# Patient Record
Sex: Male | Born: 1955 | Race: Black or African American | Hispanic: No | Marital: Married | Smoking: Current some day smoker
Health system: Southern US, Community
[De-identification: ages and names within clinical notes are randomized; demographics above are authoritative.]

## PROBLEM LIST (undated history)

## (undated) DIAGNOSIS — R6882 Decreased libido: Secondary | ICD-10-CM

## (undated) DIAGNOSIS — I10 Essential (primary) hypertension: Secondary | ICD-10-CM

## (undated) HISTORY — PX: LUMBAR LAMINECTOMY: SHX95

## (undated) HISTORY — DX: Essential (primary) hypertension: I10

## (undated) HISTORY — DX: Decreased libido: R68.82

---

## 2006-06-14 DIAGNOSIS — N410 Acute prostatitis: Secondary | ICD-10-CM | POA: Insufficient documentation

## 2006-10-10 DIAGNOSIS — L259 Unspecified contact dermatitis, unspecified cause: Secondary | ICD-10-CM | POA: Insufficient documentation

## 2007-02-01 DIAGNOSIS — R6882 Decreased libido: Secondary | ICD-10-CM

## 2007-02-01 HISTORY — DX: Decreased libido: R68.82

## 2008-01-18 DIAGNOSIS — R5383 Other fatigue: Secondary | ICD-10-CM | POA: Insufficient documentation

## 2009-01-07 DIAGNOSIS — I10 Essential (primary) hypertension: Secondary | ICD-10-CM | POA: Insufficient documentation

## 2009-01-07 DIAGNOSIS — R634 Abnormal weight loss: Secondary | ICD-10-CM | POA: Insufficient documentation

## 2009-01-07 DIAGNOSIS — J019 Acute sinusitis, unspecified: Secondary | ICD-10-CM | POA: Insufficient documentation

## 2009-07-28 DIAGNOSIS — L679 Hair color and hair shaft abnormality, unspecified: Secondary | ICD-10-CM | POA: Insufficient documentation

## 2009-10-28 DIAGNOSIS — D179 Benign lipomatous neoplasm, unspecified: Secondary | ICD-10-CM | POA: Insufficient documentation

## 2009-10-28 DIAGNOSIS — L02419 Cutaneous abscess of limb, unspecified: Secondary | ICD-10-CM | POA: Insufficient documentation

## 2013-03-15 ENCOUNTER — Emergency Department: Payer: Self-pay | Admitting: Emergency Medicine

## 2014-06-19 DIAGNOSIS — R1013 Epigastric pain: Secondary | ICD-10-CM | POA: Insufficient documentation

## 2014-06-19 DIAGNOSIS — Z125 Encounter for screening for malignant neoplasm of prostate: Secondary | ICD-10-CM | POA: Insufficient documentation

## 2014-06-19 DIAGNOSIS — N419 Inflammatory disease of prostate, unspecified: Secondary | ICD-10-CM | POA: Insufficient documentation

## 2014-08-11 ENCOUNTER — Ambulatory Visit (INDEPENDENT_AMBULATORY_CARE_PROVIDER_SITE_OTHER): Payer: 59 | Admitting: Family Medicine

## 2014-08-11 ENCOUNTER — Encounter: Payer: Self-pay | Admitting: Family Medicine

## 2014-08-11 VITALS — BP 142/86 | HR 90 | Temp 98.8°F | Resp 18 | Ht 77.0 in | Wt 215.4 lb

## 2014-08-11 DIAGNOSIS — B359 Dermatophytosis, unspecified: Secondary | ICD-10-CM | POA: Insufficient documentation

## 2014-08-11 DIAGNOSIS — I10 Essential (primary) hypertension: Secondary | ICD-10-CM

## 2014-08-11 MED ORDER — CLOTRIMAZOLE-BETAMETHASONE 1-0.05 % EX CREA: 1.0000 "application " | TOPICAL_CREAM | Freq: Two times a day (BID) | CUTANEOUS | Status: DC

## 2014-08-11 NOTE — Patient Instructions (Signed)
F/U 6 MO 

## 2014-08-11 NOTE — Addendum Note (Signed)
Addended by: Ashok Norris on: 08/11/2014 03:04 PM   Modules accepted: Miquel Dunn

## 2014-08-11 NOTE — Progress Notes (Signed)
Name: Kristopher Davis   MRN: [id number]    DOB: Jul 28, 1955   Date:08/11/2014       Progress Note  Subjective  Chief Complaint  Chief Complaint  Patient presents with  . Hypertension  . Rash    under left arm    Hypertension This is a chronic problem. The current episode started more than 1 year ago. The problem is unchanged. The problem is uncontrolled. Associated symptoms include anxiety. Pertinent negatives include no blurred vision, chest pain, headaches, neck pain, orthopnea, palpitations or shortness of breath. There are no associated agents to hypertension. Risk factors for coronary artery disease include smoking/tobacco exposure. Past treatments include diuretics. There are no compliance problems.   Rash Pertinent negatives include no congestion, cough, diarrhea, fever, joint pain, shortness of breath, sore throat or vomiting.     Past Medical History  Diagnosis Date  . Hypertension   . Decreased libido 02/01/2007    History  Substance Use Topics  . Smoking status: Current Every Day Smoker -- 1.00 packs/day    Types: Cigarettes  . Smokeless tobacco: Not on file  . Alcohol Use: 0.0 oz/week    0 Standard drinks or equivalent per week     Current outpatient prescriptions:  .  chlorthalidone (HYGROTON) 25 MG tablet, Take 1 tablet by mouth daily., Disp: , Rfl:   No Known Allergies  Review of Systems  Constitutional: Negative for fever, chills and weight loss.  HENT: Negative for congestion, hearing loss, sore throat and tinnitus.   Eyes: Negative for blurred vision, double vision and redness.  Respiratory: Negative for cough, hemoptysis and shortness of breath.   Cardiovascular: Negative for chest pain, palpitations, orthopnea, claudication and leg swelling.  Gastrointestinal: Negative for heartburn, nausea, vomiting, diarrhea, constipation and blood in stool.  Genitourinary: Negative for dysuria, urgency, frequency and hematuria.  Musculoskeletal: Negative for myalgias,  back pain, joint pain, falls and neck pain.  Skin: Positive for itching and rash (left axilla).  Neurological: Negative for dizziness, tingling, tremors, focal weakness, seizures, loss of consciousness, weakness and headaches.  Endo/Heme/Allergies: Does not bruise/bleed easily.  Psychiatric/Behavioral: Negative for depression and substance abuse. The patient is not nervous/anxious and does not have insomnia.      Objective  Filed Vitals:   08/11/14 1434  BP: 142/86  Pulse: 90  Temp: 98.8 F (37.1 C)  TempSrc: Oral  Resp: 18  Height: 6\' 5"  (1.956 m)  Weight: 215 lb 6.4 oz (97.705 kg)  SpO2: 98%     Physical Exam  Constitutional: He is oriented to person, place, and time and well-developed, well-nourished, and in no distress.  HENT:  Head: Normocephalic.  Eyes: EOM are normal. Pupils are equal, round, and reactive to light.  Neck: Normal range of motion. Neck supple. No thyromegaly present.  Cardiovascular: Normal rate, regular rhythm and normal heart sounds.   No murmur heard. Pulmonary/Chest: Effort normal and breath sounds normal. No respiratory distress. He has no wheezes.  Abdominal: Soft. Bowel sounds are normal.  Musculoskeletal: Normal range of motion. He exhibits no edema.  Lymphadenopathy:    He has no cervical adenopathy.  Neurological: He is alert and oriented to person, place, and time. No cranial nerve deficit. Gait normal. Coordination normal.  Skin: Skin is warm and dry. No rash noted.     Psychiatric: Affect and judgment normal.         Assessment & Plan 1. Benign essential HTN stable - COMPLETE METABOLIC PANEL WITH GFR - Lipid Profile  2. Tinea  LOTRISONE CREAM

## 2014-09-18 ENCOUNTER — Other Ambulatory Visit: Payer: Self-pay | Admitting: Family Medicine

## 2014-11-30 ENCOUNTER — Other Ambulatory Visit: Payer: Self-pay | Admitting: Family Medicine

## 2015-01-09 ENCOUNTER — Other Ambulatory Visit: Payer: Self-pay

## 2015-01-09 ENCOUNTER — Telehealth: Payer: Self-pay | Admitting: Family Medicine

## 2015-01-09 ENCOUNTER — Other Ambulatory Visit: Payer: Self-pay | Admitting: Family Medicine

## 2015-01-09 NOTE — Telephone Encounter (Signed)
Sent to Dr. Sowles  

## 2015-01-09 NOTE — Telephone Encounter (Signed)
PT NEEDS REFILL ON MEDICATION SAYS HE IS ONLY ON ONE. [place] [address] [facility]

## 2015-01-11 MED ORDER — CHLORTHALIDONE 25 MG PO TABS: 25.0000 mg | ORAL_TABLET | Freq: Every day | ORAL | Status: DC

## 2015-01-16 NOTE — Telephone Encounter (Signed)
ERRENEROUS

## 2015-02-13 ENCOUNTER — Other Ambulatory Visit: Payer: Self-pay | Admitting: Family Medicine

## 2015-05-05 ENCOUNTER — Other Ambulatory Visit: Payer: Self-pay | Admitting: Family Medicine

## 2015-06-11 ENCOUNTER — Other Ambulatory Visit: Payer: Self-pay | Admitting: Family Medicine

## 2015-07-09 ENCOUNTER — Ambulatory Visit (INDEPENDENT_AMBULATORY_CARE_PROVIDER_SITE_OTHER): Payer: 59 | Admitting: Family Medicine

## 2015-07-09 ENCOUNTER — Encounter: Payer: Self-pay | Admitting: Family Medicine

## 2015-07-09 VITALS — BP 132/84 | HR 91 | Temp 99.1°F | Resp 16 | Wt 215.0 lb

## 2015-07-09 DIAGNOSIS — Z114 Encounter for screening for human immunodeficiency virus [HIV]: Secondary | ICD-10-CM

## 2015-07-09 DIAGNOSIS — Z1211 Encounter for screening for malignant neoplasm of colon: Secondary | ICD-10-CM | POA: Insufficient documentation

## 2015-07-09 DIAGNOSIS — Z5181 Encounter for therapeutic drug level monitoring: Secondary | ICD-10-CM | POA: Diagnosis not present

## 2015-07-09 DIAGNOSIS — Z125 Encounter for screening for malignant neoplasm of prostate: Secondary | ICD-10-CM | POA: Diagnosis not present

## 2015-07-09 DIAGNOSIS — Z1159 Encounter for screening for other viral diseases: Secondary | ICD-10-CM | POA: Insufficient documentation

## 2015-07-09 DIAGNOSIS — I10 Essential (primary) hypertension: Secondary | ICD-10-CM | POA: Diagnosis not present

## 2015-07-09 DIAGNOSIS — Z8719 Personal history of other diseases of the digestive system: Secondary | ICD-10-CM | POA: Diagnosis not present

## 2015-07-09 DIAGNOSIS — R7401 Elevation of levels of liver transaminase levels: Secondary | ICD-10-CM

## 2015-07-09 DIAGNOSIS — Z0000 Encounter for general adult medical examination without abnormal findings: Secondary | ICD-10-CM | POA: Diagnosis not present

## 2015-07-09 DIAGNOSIS — R740 Nonspecific elevation of levels of transaminase and lactic acid dehydrogenase [LDH]: Secondary | ICD-10-CM

## 2015-07-09 MED ORDER — ASPIRIN EC 81 MG PO TBEC: 81.0000 mg | DELAYED_RELEASE_TABLET | Freq: Every day | ORAL | Status: DC

## 2015-07-09 NOTE — Assessment & Plan Note (Signed)
Continue chlorthalidone, try DASH guidelines

## 2015-07-09 NOTE — Assessment & Plan Note (Signed)
Discussed one-time HIV screening recommendation per USPSTF guidelines; patient agrees with testing; HIV antibody ordered; patient had already left when order was printed, so staff carried it next door to [place]

## 2015-07-09 NOTE — Assessment & Plan Note (Signed)
Discussed one-time hep C screening recommendation for individuals born between 1945-1965 per USPSTF guidelines; patient agrees with testing; Hep C Ab ordered; patient had already left when order was printed, so staff carried it next door to [place]

## 2015-07-09 NOTE — Progress Notes (Signed)
BP 132/84 mmHg  Pulse 91  Temp(Src) 99.1 F (37.3 C) (Oral)  Resp 16  Wt 215 lb (97.523 kg)  SpO2 97%   Subjective:    Patient ID: Kristopher Davis, male    DOB: January 16, 1956, 60 y.o.   MRN: [id number]  HPI: Kristopher Davis is a 60 y.o. male  Chief Complaint  Patient presents with  . Hypertension   He was sick last week; left his lunch in the truck and it got warm, then brought it into the house and finished; bologna and ham, bologna had turned color at edge and had mayonnaise; he got indigestion, dull ache, lower 1/3 of midline sternal area, went to bed around 6 pm and was back up by 11:30 pm and felt fine; no nausea; no chest pain; no fever; no one in the house got sick He thinks it was the sandwich; he does not think it was his heart; no family hx of heart problems listed; no high cholesterol personally to his knowledge (no lipid panel in the chart for me to verify or see previous results); labs were ordered in the fall, but there are no results; no family hx of high cholesterol; he does not take a daily aspirin  He has HTN; takes chlorthalidone, taking it for 2 or 2.5 years; no problems on that  No hx of high cholesterol; no one in the family with high cholesterol; bologna sandwich with slices of kosher pickles; regular ginger ale today (just for my knowledge as we get labs today)  Depression screen PHQ 2/9 07/09/2015  Decreased Interest 0  Down, Depressed, Hopeless 0  PHQ - 2 Score 0   Relevant past medical, surgical, family and social history reviewed Past Medical History  Diagnosis Date  . Hypertension   . Decreased libido 02/01/2007   Past Surgical History  Procedure Laterality Date  . Lumbar laminectomy     Family History  Problem Relation Age of Onset  . Cancer Mother   . Anemia Daughter    Social History  Substance Use Topics  . Smoking status: Current Every Day Smoker -- 1.00 packs/day    Types: Cigarettes  . Smokeless tobacco: None  . Alcohol Use: 0.0 oz/week   0 Standard drinks or equivalent per week  [place]  Interim medical history since last visit reviewed. Allergies and medications reviewed  Review of Systems Per HPI unless specifically indicated above     Objective:    BP 132/84 mmHg  Pulse 91  Temp(Src) 99.1 F (37.3 C) (Oral)  Resp 16  Wt 215 lb (97.523 kg)  SpO2 97%  Wt Readings from Last 3 Encounters:  07/09/15 215 lb (97.523 kg)  08/11/14 215 lb 6.4 oz (97.705 kg)  02/06/14 215 lb 9.6 oz (97.796 kg)    Physical Exam  Constitutional: He appears well-developed and well-nourished. No distress.  HENT:  Head: Normocephalic and atraumatic.  Eyes: EOM are normal. No scleral icterus.  Neck: No JVD present. No thyromegaly present.  Cardiovascular: Normal rate and regular rhythm.   Pulmonary/Chest: Effort normal and breath sounds normal. He exhibits no tenderness and no bony tenderness.  Abdominal: Soft. Bowel sounds are normal. He exhibits no distension. There is no tenderness. There is no guarding.  Musculoskeletal: He exhibits no edema.  Neurological: Coordination normal.  Skin: Skin is warm and dry. He is not diaphoretic. No pallor.  Psychiatric:  Patient very stoic and quiet, but answered questions appropriately; flat affect, but cooperative      Assessment &  Plan:   Problem List Items Addressed This Visit      Cardiovascular and Mediastinum   Benign essential HTN - Primary    Continue chlorthalidone, try DASH guidelines      Relevant Orders   Ambulatory referral to Cardiology     Other   Encounter for screening for HIV    Discussed one-time HIV screening recommendation per USPSTF guidelines; patient agrees with testing; HIV antibody ordered; patient had already left when order was printed, so staff carried it next door to [facility]      Relevant Orders   HIV antibody   History of indigestion    I explained to patient that "indigestion" in a 60 year-old man could actually have been his heart, could have been  chest pain, not just indigestion; glad he is feeling better, but I want to r/o heart disease; I recommended getting an EKG but patient refused; I suggested having him see a cardiologist to get a stress test and he did agree to do that; will put in referral; patient is completely symptom-free right now; I also advised that he start taking an 81 mg baby aspirin daily for cardioprotection; reasons to call 911 reviewed; this was the whole reason for him making the appt; while I realize his sx could have been from the food he ate, I can't ignore the possibility of coronary artery disease in a man his age      Relevant Orders   Ambulatory referral to Cardiology   Medication monitoring encounter    Check creatinine and K+ on chlorthalidone      Need for hepatitis C screening test    Discussed one-time hep C screening recommendation for individuals born between 1945-1965 per USPSTF guidelines; patient agrees with testing; Hep C Ab ordered; patient had already left when order was printed, so staff carried it next door to [place]      Relevant Orders   Hepatitis C antibody   Preventative health care    Will check labs; CMA offered HIV and hep C testing per current guidelines and patient agreed; patient refused colonoscopy referral; I discussed the option of Cologuard, but patient declined that too; he is welcome and encouraged to call to have either ordered if he changes his mind      Relevant Orders   Lipid Panel w/o Chol/HDL Ratio   Comprehensive metabolic panel   CBC with Differential/Platelet   Prostate cancer screening    Offered PSA testing and patient agreed      Relevant Orders   PSA    Other Visit Diagnoses    Essential hypertension        added by staff for EKG order    Relevant Orders    EKG 12-Lead       Follow up plan: Return in about 6 months (around 01/09/2016) for Dr. Rutherford Nail or Dr. Sanda Klein.  An after-visit summary was printed and given to the patient at [place].  Please  see the patient instructions which may contain other information and recommendations beyond what is mentioned above in the assessment and plan.  Orders Placed This Encounter  Procedures  . PSA  . Lipid Panel w/o Chol/HDL Ratio  . Comprehensive metabolic panel  . CBC with Differential/Platelet  . Hepatitis C antibody  . HIV antibody  . Ambulatory referral to Cardiology  . EKG 12-Lead

## 2015-07-09 NOTE — Assessment & Plan Note (Signed)
Will check labs; CMA offered HIV and hep C testing per current guidelines and patient agreed; patient refused colonoscopy referral; I discussed the option of Cologuard, but patient declined that too; he is welcome and encouraged to call to have either ordered if he changes his mind

## 2015-07-09 NOTE — Assessment & Plan Note (Signed)
Check creatinine and K+ on chlorthalidone

## 2015-07-09 NOTE — Assessment & Plan Note (Addendum)
I explained to patient that "indigestion" in a 60 year-old man could actually have been his heart, could have been chest pain, not just indigestion; glad he is feeling better, but I want to r/o heart disease; I recommended getting an EKG but patient refused; I suggested having him see a cardiologist to get a stress test and he did agree to do that; will put in referral; patient is completely symptom-free right now; I also advised that he start taking an 81 mg baby aspirin daily for cardioprotection; reasons to call 911 reviewed; this was the whole reason for him making the appt; while I realize his sx could have been from the food he ate, I can't ignore the possibility of coronary artery disease in a man his age

## 2015-07-09 NOTE — Patient Instructions (Addendum)
Please do start baby 81 mg coated aspirin daily If you ever develop chest pain, call 911 We'll have you see the cardiologist Have labs done today If you change your mind about colon cancer screening, just call me Try the DASH guidelines  DASH Eating Plan DASH stands for "Dietary Approaches to Stop Hypertension." The DASH eating plan is a healthy eating plan that has been shown to reduce high blood pressure (hypertension). Additional health benefits may include reducing the risk of type 2 diabetes mellitus, heart disease, and stroke. The DASH eating plan may also help with weight loss. WHAT DO I NEED TO KNOW ABOUT THE DASH EATING PLAN? For the DASH eating plan, you will follow these general guidelines:  Choose foods with a percent daily value for sodium of less than 5% (as listed on the food label).  Use salt-free seasonings or herbs instead of table salt or sea salt.  Check with your health care provider or pharmacist before using salt substitutes.  Eat lower-sodium products, often labeled as "lower sodium" or "no salt added."  Eat fresh foods.  Eat more vegetables, fruits, and low-fat dairy products.  Choose whole grains. Look for the word "whole" as the first word in the ingredient list.  Choose fish and skinless chicken or [place] more often than red meat. Limit fish, poultry, and meat to 6 oz (170 g) each day.  Limit sweets, desserts, sugars, and sugary drinks.  Choose heart-healthy fats.  Limit cheese to 1 oz (28 g) per day.  Eat more home-cooked food and less restaurant, buffet, and fast food.  Limit fried foods.  Cook foods using methods other than frying.  Limit canned vegetables. If you do use them, rinse them well to decrease the sodium.  When eating at a restaurant, ask that your food be prepared with less salt, or no salt if possible. WHAT FOODS CAN I EAT? Seek help from a dietitian for individual calorie needs. Grains Whole grain or whole wheat bread. Brown  rice. Whole grain or whole wheat pasta. Quinoa, bulgur, and whole grain cereals. Low-sodium cereals. Corn or whole wheat flour tortillas. Whole grain cornbread. Whole grain crackers. Low-sodium crackers. Vegetables Fresh or frozen vegetables (raw, steamed, roasted, or grilled). Low-sodium or reduced-sodium tomato and vegetable juices. Low-sodium or reduced-sodium tomato sauce and paste. Low-sodium or reduced-sodium canned vegetables.  Fruits All fresh, canned (in natural juice), or frozen fruits. Meat and Other Protein Products Ground beef (85% or leaner), grass-fed beef, or beef trimmed of fat. Skinless chicken or [place]. Ground chicken or [place]. Pork trimmed of fat. All fish and seafood. Eggs. Dried beans, peas, or lentils. Unsalted nuts and seeds. Unsalted canned beans. Dairy Low-fat dairy products, such as skim or 1% milk, 2% or reduced-fat cheeses, low-fat ricotta or cottage cheese, or plain low-fat yogurt. Low-sodium or reduced-sodium cheeses. Fats and Oils Tub margarines without trans fats. Light or reduced-fat mayonnaise and salad dressings (reduced sodium). Avocado. Safflower, olive, or canola oils. Natural peanut or almond butter. Other Unsalted popcorn and pretzels. The items listed above may not be a complete list of recommended foods or beverages. Contact your dietitian for more options. WHAT FOODS ARE NOT RECOMMENDED? Grains White bread. White pasta. White rice. Refined cornbread. Bagels and croissants. Crackers that contain trans fat. Vegetables Creamed or fried vegetables. Vegetables in a cheese sauce. Regular canned vegetables. Regular canned tomato sauce and paste. Regular tomato and vegetable juices. Fruits Dried fruits. Canned fruit in light or heavy syrup. Fruit juice. Meat and Other Protein  Products Fatty cuts of meat. Ribs, chicken wings, bacon, sausage, bologna, salami, chitterlings, fatback, hot dogs, bratwurst, and packaged luncheon meats. Salted nuts and seeds.  Canned beans with salt. Dairy Whole or 2% milk, cream, half-and-half, and cream cheese. Whole-fat or sweetened yogurt. Full-fat cheeses or blue cheese. Nondairy creamers and whipped toppings. Processed cheese, cheese spreads, or cheese curds. Condiments Onion and garlic salt, seasoned salt, table salt, and sea salt. Canned and packaged gravies. Worcestershire sauce. Tartar sauce. Barbecue sauce. Teriyaki sauce. Soy sauce, including reduced sodium. Steak sauce. Fish sauce. Oyster sauce. Cocktail sauce. Horseradish. Ketchup and mustard. Meat flavorings and tenderizers. Bouillon cubes. Hot sauce. Tabasco sauce. Marinades. Taco seasonings. Relishes. Fats and Oils Butter, stick margarine, lard, shortening, ghee, and bacon fat. Coconut, palm kernel, or palm oils. Regular salad dressings. Other Pickles and olives. Salted popcorn and pretzels. The items listed above may not be a complete list of foods and beverages to avoid. Contact your dietitian for more information. WHERE CAN I FIND MORE INFORMATION? National Heart, Lung, and Blood Institute: travelstabloid.com   This information is not intended to replace advice given to you by your health care provider. Make sure you discuss any questions you have with your health care provider.   Document Released: 02/03/2011 Document Revised: 03/07/2014 Document Reviewed: 12/19/2012 Elsevier Interactive Patient Education [employer].

## 2015-07-09 NOTE — Assessment & Plan Note (Signed)
Offered PSA testing and patient agreed

## 2015-07-14 DIAGNOSIS — R740 Nonspecific elevation of levels of transaminase and lactic acid dehydrogenase [LDH]: Secondary | ICD-10-CM

## 2015-07-14 DIAGNOSIS — R7401 Elevation of levels of liver transaminase levels: Secondary | ICD-10-CM | POA: Insufficient documentation

## 2015-07-14 LAB — CBC WITH DIFFERENTIAL/PLATELET
BASOS: 1 %
Basophils Absolute: 0 10*3/uL (ref 0.0–0.2)
EOS (ABSOLUTE): 0 10*3/uL (ref 0.0–0.4)
EOS: 1 %
HEMATOCRIT: 44.1 % (ref 37.5–51.0)
Hemoglobin: 14.8 g/dL (ref 12.6–17.7)
Immature Grans (Abs): 0 10*3/uL (ref 0.0–0.1)
Immature Granulocytes: 0 %
LYMPHS ABS: 1.6 10*3/uL (ref 0.7–3.1)
Lymphs: 41 %
MCH: 30.6 pg (ref 26.6–33.0)
MCHC: 33.6 g/dL (ref 31.5–35.7)
MCV: 91 fL (ref 79–97)
MONOS ABS: 0.3 10*3/uL (ref 0.1–0.9)
Monocytes: 9 %
NEUTROS ABS: 1.9 10*3/uL (ref 1.4–7.0)
Neutrophils: 48 %
Platelets: 225 10*3/uL (ref 150–379)
RBC: 4.83 x10E6/uL (ref 4.14–5.80)
RDW: 14.6 % (ref 12.3–15.4)
WBC: 3.9 10*3/uL (ref 3.4–10.8)

## 2015-07-14 LAB — COMPREHENSIVE METABOLIC PANEL
ALK PHOS: 55 IU/L (ref 39–117)
ALT: 34 IU/L (ref 0–44)
AST: 47 IU/L — AB (ref 0–40)
Albumin/Globulin Ratio: 1.3 (ref 1.2–2.2)
Albumin: 4.1 g/dL (ref 3.5–5.5)
BUN/Creatinine Ratio: 8 — ABNORMAL LOW (ref 9–20)
BUN: 9 mg/dL (ref 6–24)
Bilirubin Total: 0.7 mg/dL (ref 0.0–1.2)
CHLORIDE: 98 mmol/L (ref 96–106)
CO2: 23 mmol/L (ref 18–29)
Calcium: 9.1 mg/dL (ref 8.7–10.2)
Creatinine, Ser: 1.09 mg/dL (ref 0.76–1.27)
GFR calc non Af Amer: 74 mL/min/{1.73_m2} (ref >59)
GFR, EST AFRICAN AMERICAN: 85 mL/min/{1.73_m2} (ref >59)
GLUCOSE: 79 mg/dL (ref 65–99)
Globulin, Total: 3.1 g/dL (ref 1.5–4.5)
Potassium: 3.9 mmol/L (ref 3.5–5.2)
Sodium: 139 mmol/L (ref 134–144)
TOTAL PROTEIN: 7.2 g/dL (ref 6.0–8.5)

## 2015-07-14 LAB — LIPID PANEL W/O CHOL/HDL RATIO
Cholesterol, Total: 145 mg/dL (ref 100–199)
HDL: 78 mg/dL (ref >39)
LDL CALC: 48 mg/dL (ref 0–99)
Triglycerides: 93 mg/dL (ref 0–149)
VLDL CHOLESTEROL CAL: 19 mg/dL (ref 5–40)

## 2015-07-14 LAB — HIV ANTIBODY (ROUTINE TESTING W REFLEX): HIV Screen 4th Generation wRfx: NONREACTIVE

## 2015-07-14 LAB — PSA: Prostate Specific Ag, Serum: 1 ng/mL (ref 0.0–4.0)

## 2015-07-14 LAB — HEPATITIS C ANTIBODY: Hep C Virus Ab: <0.1 s/co ratio (ref 0.0–0.9)

## 2015-07-14 NOTE — Addendum Note (Signed)
Addended by: LADA, Satira Anis on: 07/14/2015 10:19 PM   Modules accepted: Orders, SmartSet

## 2015-07-23 ENCOUNTER — Encounter: Payer: Self-pay | Admitting: *Deleted

## 2015-07-28 ENCOUNTER — Other Ambulatory Visit: Payer: Self-pay | Admitting: Family Medicine

## 2015-07-31 ENCOUNTER — Other Ambulatory Visit: Payer: Self-pay | Admitting: Family Medicine

## 2015-07-31 MED ORDER — CHLORTHALIDONE 25 MG PO TABS: 25.0000 mg | ORAL_TABLET | Freq: Every day | ORAL | Status: DC

## 2015-07-31 NOTE — Telephone Encounter (Signed)
Last Cr and -lytes normal; Rx approved

## 2015-07-31 NOTE — Telephone Encounter (Signed)
Pt needs refills blood pressure meds. [facility]

## 2015-10-15 ENCOUNTER — Ambulatory Visit: Payer: 59 | Admitting: Family Medicine

## 2015-10-16 ENCOUNTER — Ambulatory Visit (INDEPENDENT_AMBULATORY_CARE_PROVIDER_SITE_OTHER): Payer: 59 | Admitting: Family Medicine

## 2015-10-16 ENCOUNTER — Encounter: Payer: Self-pay | Admitting: Family Medicine

## 2015-10-16 VITALS — BP 132/86 | HR 107 | Temp 98.7°F | Resp 18 | Ht 72.0 in | Wt 197.3 lb

## 2015-10-16 DIAGNOSIS — M62838 Other muscle spasm: Secondary | ICD-10-CM | POA: Diagnosis not present

## 2015-10-16 DIAGNOSIS — I10 Essential (primary) hypertension: Secondary | ICD-10-CM

## 2015-10-16 DIAGNOSIS — R1013 Epigastric pain: Secondary | ICD-10-CM | POA: Diagnosis not present

## 2015-10-16 DIAGNOSIS — R634 Abnormal weight loss: Secondary | ICD-10-CM | POA: Diagnosis not present

## 2015-10-16 DIAGNOSIS — R740 Nonspecific elevation of levels of transaminase and lactic acid dehydrogenase [LDH]: Secondary | ICD-10-CM

## 2015-10-16 DIAGNOSIS — R066 Hiccough: Secondary | ICD-10-CM

## 2015-10-16 DIAGNOSIS — R7401 Elevation of levels of liver transaminase levels: Secondary | ICD-10-CM

## 2015-10-16 DIAGNOSIS — K30 Functional dyspepsia: Secondary | ICD-10-CM

## 2015-10-16 DIAGNOSIS — R630 Anorexia: Secondary | ICD-10-CM

## 2015-10-16 LAB — COMPLETE METABOLIC PANEL WITH GFR
ALT: 80 U/L — ABNORMAL HIGH (ref 9–46)
AST: 101 U/L — AB (ref 10–35)
Albumin: 3.5 g/dL — ABNORMAL LOW (ref 3.6–5.1)
Alkaline Phosphatase: 71 U/L (ref 40–115)
BUN: 4 mg/dL — AB (ref 7–25)
CALCIUM: 8.9 mg/dL (ref 8.6–10.3)
CHLORIDE: 105 mmol/L (ref 98–110)
CO2: 27 mmol/L (ref 20–31)
Creat: 1.04 mg/dL (ref 0.70–1.33)
GFR, EST NON AFRICAN AMERICAN: 78 mL/min (ref >=60)
GFR, Est African American: >89 mL/min (ref >=60)
Glucose, Bld: 91 mg/dL (ref 65–99)
POTASSIUM: 4 mmol/L (ref 3.5–5.3)
Sodium: 139 mmol/L (ref 135–146)
Total Bilirubin: 0.8 mg/dL (ref 0.2–1.2)
Total Protein: 6.5 g/dL (ref 6.1–8.1)

## 2015-10-16 LAB — CBC WITH DIFFERENTIAL/PLATELET
Basophils Absolute: 0 cells/uL (ref 0–200)
Basophils Relative: 0 %
EOS PCT: 1 %
Eosinophils Absolute: 42 cells/uL (ref 15–500)
HEMATOCRIT: 41.9 % (ref 38.5–50.0)
Hemoglobin: 14.2 g/dL (ref 13.2–17.1)
LYMPHS PCT: 37 %
Lymphs Abs: 1554 cells/uL (ref 850–3900)
MCH: 30.9 pg (ref 27.0–33.0)
MCHC: 33.9 g/dL (ref 32.0–36.0)
MCV: 91.3 fL (ref 80.0–100.0)
MPV: 9.2 fL (ref 7.5–12.5)
Monocytes Absolute: 336 cells/uL (ref 200–950)
Monocytes Relative: 8 %
NEUTROS PCT: 54 %
Neutro Abs: 2268 cells/uL (ref 1500–7800)
Platelets: 240 10*3/uL (ref 140–400)
RBC: 4.59 MIL/uL (ref 4.20–5.80)
RDW: 13.6 % (ref 11.0–15.0)
WBC: 4.2 10*3/uL (ref 3.8–10.8)

## 2015-10-16 LAB — LIPASE: LIPASE: 52 U/L (ref 7–60)

## 2015-10-16 LAB — MAGNESIUM: MAGNESIUM: 1.6 mg/dL (ref 1.5–2.5)

## 2015-10-16 LAB — VITAMIN B12: VITAMIN B 12: 810 pg/mL (ref 200–1100)

## 2015-10-16 LAB — TSH: TSH: 0.89 m[IU]/L (ref 0.40–4.50)

## 2015-10-16 NOTE — Progress Notes (Signed)
Name: Kristopher Davis   MRN: [id number]    DOB: [birth year]   Date:10/16/2015       Progress Note  Subjective  Chief Complaint  Chief Complaint  Patient presents with  . Edema    loss of appetite  . Spasms  . Weight Loss    HPI  He has recurrent episodes of nausea, indigestion that has been going on for the past few months. Initially he thought it was secondary to a sandwich that had stayed in his car and he thought it was food poisoning. However he went on vacation this past week and lost 18 lbs over the past couple of weeks. Initially had hiccups, followed by indigestion and the fear of eating , but denies nausea, vomiting or change in bowel movements. He has noticed severe fatigue, muscle cramps/spasms all over his body and he had two days of lower extremity edema. He has been on chlorthalidone for over one year. He denies abdominal pain or back pain.    Patient Active Problem List   Diagnosis Date Noted  . Elevated AST (SGOT) 07/14/2015  . Prostate cancer screening 07/09/2015  . Preventative health care 07/09/2015  . Medication monitoring encounter 07/09/2015  . History of indigestion 07/09/2015  . Encounter for screening for HIV 07/09/2015  . Need for hepatitis C screening test 07/09/2015  . Tinea 08/11/2014  . Special screening for malignant neoplasm of prostate 06/19/2014  . Abdominal pain, epigastric 06/19/2014  . 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 Disease of hair and hair follicles 99991111  . Benign essential HTN 01/07/2009  . Fatigue 01/18/2008  . Decreased libido 02/01/2007  . CD (contact dermatitis) 10/10/2006    Past Surgical History:  Procedure Laterality Date  . LUMBAR LAMINECTOMY      Family History  Problem Relation Age of Onset  . Cancer Mother   . Anemia Daughter     Social History   Social History  . Marital status: Married    Spouse name: N/A  . Number of children: N/A  . Years of education: N/A   Occupational History  . Not on file.   Social History  Main Topics  . Smoking status: Current Every Day Smoker    Packs/day: 0.25    Types: Cigarettes  . Smokeless tobacco: Never Used  . Alcohol use 0.0 oz/week  . Drug use: No  . Sexual activity: Yes    Partners: Female   Other Topics Concern  . Not on file   Social History Narrative  . No narrative on file     Current Outpatient Prescriptions:  .  aspirin EC 81 MG tablet, Take 1 tablet (81 mg total) by mouth daily., Disp: 30 tablet, Rfl: 11 .  chlorthalidone (HYGROTON) 25 MG tablet, Take 1 tablet (25 mg total) by mouth daily., Disp: 30 tablet, Rfl: 5  No Known Allergies   ROS  Ten systems reviewed and is negative except as mentioned in HPI  He denies SOB or orthopnea  Objective  Vitals:   10/16/15 1429  BP: 132/86  Pulse: (!) 107  Resp: 18  Temp: 98.7 F (37.1 C)  SpO2: 97%  Weight: 197 lb 5 oz (89.5 kg)  Height: 6' (1.829 m)    Body mass index is 26.76 kg/m.  Physical Exam  Constitutional: Patient appears well-developed and well-nourished. Thin  No distress.  HEENT: head atraumatic, normocephalic, pupils equal and reactive to light,  neck supple, throat within normal limits Cardiovascular: Normal rate, regular rhythm and normal heart sounds.  No murmur heard. No BLE edema. Pulmonary/Chest: Effort normal and breath sounds normal. No respiratory distress. Abdominal: Soft.  There is no tenderness. Psychiatric: Patient has a normal mood and affect. behavior is normal. Judgment and thought content normal.  PHQ2/9: Depression screen [facility] 2/9 10/16/2015 07/09/2015  Decreased Interest 0 0  Down, Depressed, Hopeless 0 0  PHQ - 2 Score 0 0    Fall Risk: Fall Risk  10/16/2015 07/09/2015 08/11/2014  Falls in the past year? No Yes No  Number falls in past yr: - 1 -    Functional Status Survey: Is the patient deaf or have difficulty hearing?: Yes Does the patient have difficulty seeing, even when wearing glasses/contacts?: Yes Does the patient have difficulty  concentrating, remembering, or making decisions?: No Does the patient have difficulty walking or climbing stairs?: No Does the patient have difficulty dressing or bathing?: No Does the patient have difficulty doing errands alone such as visiting a doctor's office or shopping?: No    Assessment & Plan  1. Weight loss, abnormal  - CBC with Differential/Platelet - COMPLETE METABOLIC PANEL WITH GFR - TSH  2. Elevated AST (SGOT)  Recheck labs  3. Spasm of muscle  - Magnesium  4. Hiccups  - Troponin I - EKG 12-Lead  5. Indigestion  - Troponin I  6. Lack of appetite  - Vitamin B12 - VITAMIN D 25 Hydroxy (Vit-D Deficiency, Fractures)  7. Essential hypertension  - Troponin I - EKG 12-Lead

## 2015-10-17 LAB — TROPONIN I

## 2015-10-17 LAB — VITAMIN D 25 HYDROXY (VIT D DEFICIENCY, FRACTURES): VIT D 25 HYDROXY: 27 ng/mL — AB (ref 30–100)

## 2015-10-18 ENCOUNTER — Other Ambulatory Visit: Payer: Self-pay | Admitting: Family Medicine

## 2015-10-18 DIAGNOSIS — R748 Abnormal levels of other serum enzymes: Secondary | ICD-10-CM

## 2015-10-18 DIAGNOSIS — R1010 Upper abdominal pain, unspecified: Secondary | ICD-10-CM

## 2015-10-18 DIAGNOSIS — K30 Functional dyspepsia: Secondary | ICD-10-CM

## 2015-10-26 ENCOUNTER — Ambulatory Visit (INDEPENDENT_AMBULATORY_CARE_PROVIDER_SITE_OTHER): Payer: 59 | Admitting: Family Medicine

## 2015-10-26 ENCOUNTER — Encounter: Payer: Self-pay | Admitting: Family Medicine

## 2015-10-26 VITALS — BP 134/86 | HR 86 | Temp 98.6°F | Resp 18 | Ht 72.0 in | Wt 206.2 lb

## 2015-10-26 DIAGNOSIS — R770 Abnormality of albumin: Secondary | ICD-10-CM | POA: Diagnosis not present

## 2015-10-26 DIAGNOSIS — R748 Abnormal levels of other serum enzymes: Secondary | ICD-10-CM

## 2015-10-26 NOTE — Progress Notes (Signed)
Name: Kristopher Davis   MRN: [id number]    DOB: 1956-01-11   Date:10/26/2015       Progress Note  Subjective  Chief Complaint  Chief Complaint  Patient presents with  . Weight Loss    1 week follow up    HPI  He has recurrent episodes of nausea, indigestion that has been going on for the past few months. Initially he thought it was secondary to a sandwich that had stayed in his car and he thought it was food poisoning. However he went on vacation this past week and lost 18 lbs over the past couple of weeks. Initially had hiccups, followed by indigestion and the fear of eating , but denies nausea, vomiting or change in bowel movements. He has noticed severe fatigue, muscle cramps/spasms all over his body and he had two days of lower extremity edema. He has been on chlorthalidone for over one year. He denies abdominal pain or back pain. He is now feeling well, has gained 9 lbs since last week, able to eat everything, leg cramps resolved. Labs discussed with patient, low albumin level , slightly low vitamin D and elevated liver enzymes. He is wiling to have RUQ [place] and repeat comp panel in a couple of weeks.    Patient Active Problem List   Diagnosis Date Noted  . Elevated AST (SGOT) 07/14/2015  . Prostate cancer screening 07/09/2015  . Preventative health care 07/09/2015  . Medication monitoring encounter 07/09/2015  . History of indigestion 07/09/2015  . Encounter for screening for HIV 07/09/2015  . Need for hepatitis C screening test 07/09/2015  . Tinea 08/11/2014  . Special screening for malignant neoplasm of prostate 06/19/2014  . Abdominal pain, epigastric 06/19/2014  . 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 Disease of hair and hair follicles 64/40/3474  . Benign essential HTN 01/07/2009  . Fatigue 01/18/2008  . Decreased libido 02/01/2007  . CD (contact dermatitis) 10/10/2006    Past Surgical History:  Procedure Laterality Date  . LUMBAR LAMINECTOMY      Family History  Problem Relation Age of  Onset  . Cancer Mother   . Anemia Daughter     Social History   Social History  . Marital status: Married    Spouse name: N/A  . Number of children: N/A  . Years of education: N/A   Occupational History  . Not on file.   Social History Main Topics  . Smoking status: Current Every Day Smoker    Packs/day: 0.25    Types: Cigarettes  . Smokeless tobacco: Never Used  . Alcohol use 0.0 oz/week  . Drug use: No  . Sexual activity: Yes    Partners: Female   Other Topics Concern  . Not on file   Social History Narrative  . No narrative on file     Current Outpatient Prescriptions:  .  chlorthalidone (HYGROTON) 25 MG tablet, Take 1 tablet (25 mg total) by mouth daily., Disp: 30 tablet, Rfl: 5 .  aspirin EC 81 MG tablet, Take 1 tablet (81 mg total) by mouth daily. (Patient not taking: Reported on 10/26/2015), Disp: 30 tablet, Rfl: 11  No Known Allergies   ROS  Ten systems reviewed and is negative except as mentioned in HPI   Objective  Vitals:   10/26/15 1429  BP: 134/86  Pulse: 86  Resp: 18  Temp: 98.6 F (37 C)  SpO2: 98%  Weight: 206 lb 3 oz (93.5 kg)  Height: 6' (1.829 m)    Body mass  index is 27.96 kg/m.  Physical Exam  Constitutional: Patient appears well-developed and well-nourished.  No distress.  HEENT: head atraumatic, normocephalic, pupils equal and reactive to light, neck supple, throat within normal limits Cardiovascular: Normal rate, regular rhythm and normal heart sounds.  No murmur heard. No BLE edema. Pulmonary/Chest: Effort normal and breath sounds normal. No respiratory distress. Abdominal: Soft.  There is no tenderness. Psychiatric: Patient has a normal mood and affect. behavior is normal. Judgment and thought content normal.  Recent Results (from the past 2160 hour(s))  Lipase     Status: None   Collection Time: 10/16/15  3:00 PM  Result Value Ref Range   Lipase 52 7 - 60 U/L  CBC with Differential/Platelet     Status: None    Collection Time: 10/16/15  3:35 PM  Result Value Ref Range   WBC 4.2 3.8 - 10.8 K/uL   RBC 4.59 4.20 - 5.80 MIL/uL   Hemoglobin 14.2 13.2 - 17.1 g/dL   HCT 41.9 38.5 - 50.0 %   MCV 91.3 80.0 - 100.0 fL   MCH 30.9 27.0 - 33.0 pg   MCHC 33.9 32.0 - 36.0 g/dL   RDW 13.6 11.0 - 15.0 %   Platelets 240 140 - 400 K/uL   MPV 9.2 7.5 - 12.5 fL   Neutro Abs 2,268 1,500 - 7,800 cells/uL   Lymphs Abs 1,554 850 - 3,900 cells/uL   Monocytes Absolute 336 200 - 950 cells/uL   Eosinophils Absolute 42 15 - 500 cells/uL   Basophils Absolute 0 0 - 200 cells/uL   Neutrophils Relative % 54 %   Lymphocytes Relative 37 %   Monocytes Relative 8 %   Eosinophils Relative 1 %   Basophils Relative 0 %   Smear Review Criteria for review not met   COMPLETE METABOLIC PANEL WITH GFR     Status: Abnormal   Collection Time: 10/16/15  3:35 PM  Result Value Ref Range   Sodium 139 135 - 146 mmol/L   Potassium 4.0 3.5 - 5.3 mmol/L   Chloride 105 98 - 110 mmol/L   CO2 27 20 - 31 mmol/L   Glucose, Bld 91 65 - 99 mg/dL   BUN 4 (L) 7 - 25 mg/dL   Creat 1.04 0.70 - 1.33 mg/dL    Comment:   For patients > or = 60 years of age: The upper reference limit for Creatinine is approximately 13% higher for people identified as African-American.      Total Bilirubin 0.8 0.2 - 1.2 mg/dL   Alkaline Phosphatase 71 40 - 115 U/L   AST 101 (H) 10 - 35 U/L   ALT 80 (H) 9 - 46 U/L   Total Protein 6.5 6.1 - 8.1 g/dL   Albumin 3.5 (L) 3.6 - 5.1 g/dL   Calcium 8.9 8.6 - 10.3 mg/dL   GFR, Est African American >89 >=60 mL/min   GFR, Est Non African American 78 >=60 mL/min  TSH     Status: None   Collection Time: 10/16/15  3:35 PM  Result Value Ref Range   TSH 0.89 0.40 - 4.50 mIU/L  Vitamin B12     Status: None   Collection Time: 10/16/15  3:35 PM  Result Value Ref Range   Vitamin B-12 810 200 - 1,100 pg/mL  VITAMIN D 25 Hydroxy (Vit-D Deficiency, Fractures)     Status: Abnormal   Collection Time: 10/16/15  3:35 PM  Result  Value Ref Range   Vit D, 25-Hydroxy   27 (L) 30 - 100 ng/mL    Comment: Vitamin D Status           25-OH Vitamin D        Deficiency                <20 ng/mL        Insufficiency         20 - 29 ng/mL        Optimal             > or = 30 ng/mL   For 25-OH Vitamin D testing on patients on D2-supplementation and patients for whom quantitation of D2 and D3 fractions is required, the QuestAssureD 25-OH VIT D, (D2,D3), LC/MS/MS is recommended: order code [id number] (patients > 2 yrs).   Magnesium     Status: None   Collection Time: 10/16/15  3:35 PM  Result Value Ref Range   Magnesium 1.6 1.5 - 2.5 mg/dL  Troponin I     Status: None   Collection Time: 10/16/15  3:35 PM  Result Value Ref Range   Troponin I <0.01 <=0.05 ng/mL    Comment:   In accord with published recommendations, serial testing of Troponin I at intervals of 2 to 4 hours for up to 12 to 24 hours is suggested in order to corroborate a single Troponin I result. An elevated troponin alone is not sufficient to make the diagnosis of MI.   Because of a new reagent formulation: 0.1-0.5 ng/mL: Suggests onset of myocardial damage 0.6-1.5 ng/mL: Suggests the presence of AMI   ** Please note change in reference range(s). **         PHQ2/9: Depression screen [facility] 2/9 10/26/2015 10/16/2015 07/09/2015  Decreased Interest 0 0 0  Down, Depressed, Hopeless 0 0 0  PHQ - 2 Score 0 0 0    Fall Risk: Fall Risk  10/26/2015 10/16/2015 07/09/2015 08/11/2014  Falls in the past year? No No Yes No  Number falls in past yr: - - 1 -    Functional Status Survey: Is the patient deaf or have difficulty hearing?: Yes Does the patient have difficulty seeing, even when wearing glasses/contacts?: No Does the patient have difficulty concentrating, remembering, or making decisions?: No Does the patient have difficulty walking or climbing stairs?: No Does the patient have difficulty dressing or bathing?: No Does the patient have difficulty doing errands  alone such as visiting a doctor's office or shopping?: No    Assessment & Plan  1. 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   2. Abnormal albumin  - COMPLETE METABOLIC PANEL WITH GFR

## 2015-11-04 ENCOUNTER — Ambulatory Visit: Payer: 59

## 2015-11-09 ENCOUNTER — Other Ambulatory Visit: Payer: Self-pay | Admitting: Family Medicine

## 2015-11-09 ENCOUNTER — Ambulatory Visit
Admission: RE | Admit: 2015-11-09 | Discharge: 2015-11-09 | Disposition: A | Discharge status: Home / Self Care | Payer: 59 | Source: Ambulatory Visit | Attending: Family Medicine | Admitting: Family Medicine

## 2015-11-09 DIAGNOSIS — K30 Functional dyspepsia: Secondary | ICD-10-CM

## 2015-11-09 DIAGNOSIS — R748 Abnormal levels of other serum enzymes: Secondary | ICD-10-CM | POA: Diagnosis present

## 2015-11-09 DIAGNOSIS — R1013 Epigastric pain: Secondary | ICD-10-CM | POA: Insufficient documentation

## 2015-11-09 DIAGNOSIS — D1803 Hemangioma of intra-abdominal structures: Secondary | ICD-10-CM | POA: Diagnosis not present

## 2015-11-09 DIAGNOSIS — R112 Nausea with vomiting, unspecified: Secondary | ICD-10-CM

## 2015-11-09 DIAGNOSIS — R1010 Upper abdominal pain, unspecified: Secondary | ICD-10-CM

## 2015-11-10 ENCOUNTER — Telehealth: Payer: Self-pay

## 2015-11-10 NOTE — Telephone Encounter (Signed)
Unable to leave voicemail due to box being full.

## 2015-11-10 NOTE — Telephone Encounter (Signed)
-----   Message from Steele Sizer, MD sent at 11/09/2015 11:06 AM EDT ----- Gallbladder US was normal but I recommend a HIDA scan since he has recurrent symptoms. 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

## 2015-11-16 NOTE — Telephone Encounter (Signed)
Called patient back and explained his [place] to him and informed him we mailed him a copy as well.

## 2015-11-20 ENCOUNTER — Encounter
Admission: RE | Admit: 2015-11-20 | Discharge: 2015-11-20 | Disposition: A | Discharge status: Home / Self Care | Payer: 59 | Source: Ambulatory Visit | Attending: Family Medicine | Admitting: Family Medicine

## 2015-11-20 DIAGNOSIS — R748 Abnormal levels of other serum enzymes: Secondary | ICD-10-CM

## 2015-11-20 DIAGNOSIS — R112 Nausea with vomiting, unspecified: Secondary | ICD-10-CM | POA: Diagnosis present

## 2015-11-20 MED ORDER — TECHNETIUM TC 99M MEBROFENIN IV KIT
5.0900 | PACK | Freq: Once | INTRAVENOUS | Status: AC | PRN
  Administered 2015-11-20: 5.09 via INTRAVENOUS

## 2016-01-11 ENCOUNTER — Ambulatory Visit: Payer: 59 | Admitting: Family Medicine

## 2016-01-12 ENCOUNTER — Telehealth: Payer: Self-pay | Admitting: Family Medicine

## 2016-01-12 NOTE — Telephone Encounter (Signed)
Pt would like a call back about one of his medications. It is concerning Chlorthalidone. Pt states it is on back order. Pt request for a call back.

## 2016-01-15 NOTE — Telephone Encounter (Signed)
Called pt multiple times and could not leave a meassage due to his mailbox being full.

## 2016-01-27 ENCOUNTER — Ambulatory Visit: Payer: 59 | Admitting: Family Medicine

## 2016-02-27 ENCOUNTER — Emergency Department
Admission: EM | Admit: 2016-02-27 | Discharge: 2016-02-27 | Disposition: A | Discharge status: Home / Self Care | Payer: 59 | Attending: Emergency Medicine | Admitting: Emergency Medicine

## 2016-02-27 ENCOUNTER — Emergency Department: Payer: 59

## 2016-02-27 DIAGNOSIS — M545 Low back pain: Secondary | ICD-10-CM | POA: Diagnosis not present

## 2016-02-27 DIAGNOSIS — S3992XA Unspecified injury of lower back, initial encounter: Secondary | ICD-10-CM | POA: Diagnosis present

## 2016-02-27 DIAGNOSIS — I10 Essential (primary) hypertension: Secondary | ICD-10-CM | POA: Insufficient documentation

## 2016-02-27 DIAGNOSIS — Z7982 Long term (current) use of aspirin: Secondary | ICD-10-CM | POA: Insufficient documentation

## 2016-02-27 DIAGNOSIS — Y92410 Unspecified street and highway as the place of occurrence of the external cause: Secondary | ICD-10-CM | POA: Insufficient documentation

## 2016-02-27 DIAGNOSIS — Z79899 Other long term (current) drug therapy: Secondary | ICD-10-CM | POA: Diagnosis not present

## 2016-02-27 DIAGNOSIS — Y999 Unspecified external cause status: Secondary | ICD-10-CM | POA: Diagnosis not present

## 2016-02-27 DIAGNOSIS — F17210 Nicotine dependence, cigarettes, uncomplicated: Secondary | ICD-10-CM | POA: Insufficient documentation

## 2016-02-27 DIAGNOSIS — Y939 Activity, unspecified: Secondary | ICD-10-CM | POA: Insufficient documentation

## 2016-02-27 MED ORDER — CYCLOBENZAPRINE HCL 5 MG PO TABS: 5.0000 mg | ORAL_TABLET | Freq: Three times a day (TID) | ORAL | 0 refills | Status: AC | PRN

## 2016-02-27 MED ORDER — OXYCODONE-ACETAMINOPHEN 5-325 MG PO TABS
1.0000 | ORAL_TABLET | Freq: Once | ORAL | Status: AC
  Administered 2016-02-27: 1 via ORAL
  Filled 2016-02-27: qty 1

## 2016-02-27 MED ORDER — HYDROCODONE-ACETAMINOPHEN 5-325 MG PO TABS: 1.0000 | ORAL_TABLET | Freq: Four times a day (QID) | ORAL | 0 refills | Status: DC | PRN

## 2016-02-27 NOTE — ED Triage Notes (Signed)
States he was in [facility] last night, states lower back pain, ambulatory to triage
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
## 2016-02-27 NOTE — ED Notes (Signed)
Pt alert and oriented X4, active, cooperative, pt in NAD. RR even and unlabored, color WNL.  Pt informed to return if any life threatening symptoms occur.   
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
## 2016-02-27 NOTE — ED Notes (Signed)
Pt ambulatory to room but slowly. Pain worsening with movement to lower back.

## 2016-03-03 ENCOUNTER — Telehealth: Payer: Self-pay
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
## 2016-03-10 ENCOUNTER — Ambulatory Visit (INDEPENDENT_AMBULATORY_CARE_PROVIDER_SITE_OTHER): Payer: Self-pay | Admitting: Family Medicine

## 2016-03-10 ENCOUNTER — Encounter: Payer: Self-pay | Admitting: Family Medicine

## 2016-03-10 DIAGNOSIS — M545 Low back pain, unspecified: Secondary | ICD-10-CM

## 2016-03-10 DIAGNOSIS — M5136 Other intervertebral disc degeneration, lumbar region: Secondary | ICD-10-CM

## 2016-03-10 MED ORDER — CYCLOBENZAPRINE HCL 10 MG PO TABS: 10.0000 mg | ORAL_TABLET | Freq: Every day | ORAL | 0 refills | Status: DC
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
## 2016-04-12 ENCOUNTER — Ambulatory Visit (INDEPENDENT_AMBULATORY_CARE_PROVIDER_SITE_OTHER): Payer: 59 | Admitting: Family Medicine

## 2016-04-12 ENCOUNTER — Encounter: Payer: Self-pay | Admitting: Family Medicine
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
## 2016-04-28 ENCOUNTER — Telehealth: Payer: Self-pay | Admitting: Family Medicine

## 2016-05-02 NOTE — Telephone Encounter (Signed)
Left voice message informing pt to return call to schedule appt. °

## 2016-05-26 ENCOUNTER — Other Ambulatory Visit: Payer: Self-pay | Admitting: Family Medicine

## 2016-05-27 NOTE — Telephone Encounter (Signed)
Please call and schedule patient a medication refill appointment. Sent in a 30 day supply with no refills. Unable to sent anymore until patient comes in for a regular follow up for his BP. Thanks

## 2016-05-27 NOTE — Telephone Encounter (Signed)
lmom to schedule an appt

## 2016-07-22 ENCOUNTER — Other Ambulatory Visit: Payer: Self-pay | Admitting: Family Medicine

## 2016-07-24 NOTE — Telephone Encounter (Signed)
It looks like patient had abnormal labs back in the fall; Dr. Ancil Boozer ordered additional labs; please have those followed up on; thank you

## 2016-08-24 ENCOUNTER — Telehealth: Payer: Self-pay | Admitting: Family Medicine

## 2016-08-25 NOTE — Telephone Encounter (Signed)
Mail box full not able to leave message.  °

## 2016-08-26 NOTE — Telephone Encounter (Signed)
Mailbox full

## 2016-08-30 NOTE — Telephone Encounter (Signed)
Today was my third attempt to contact patient. His mailbox is full and he has not returned any phone calls.

## 2016-09-12 ENCOUNTER — Other Ambulatory Visit: Payer: Self-pay | Admitting: Family Medicine

## 2016-09-12 NOTE — Telephone Encounter (Signed)
Patient requesting refill of Chlorthalidone to [facility].

## 2016-09-13 NOTE — Telephone Encounter (Signed)
Mailbox is full. Unable to leave a message.  

## 2016-11-04 ENCOUNTER — Encounter: Payer: Self-pay | Admitting: Family Medicine

## 2016-11-04 ENCOUNTER — Ambulatory Visit (INDEPENDENT_AMBULATORY_CARE_PROVIDER_SITE_OTHER): Payer: BLUE CROSS/BLUE SHIELD | Admitting: Family Medicine

## 2016-11-04 VITALS — BP 146/88 | HR 95 | Temp 98.6°F | Resp 16 | Ht 76.5 in | Wt 212.6 lb

## 2016-11-04 DIAGNOSIS — Z23 Encounter for immunization: Secondary | ICD-10-CM

## 2016-11-04 DIAGNOSIS — R7401 Elevation of levels of liver transaminase levels: Secondary | ICD-10-CM

## 2016-11-04 DIAGNOSIS — Z5181 Encounter for therapeutic drug level monitoring: Secondary | ICD-10-CM | POA: Diagnosis not present

## 2016-11-04 DIAGNOSIS — R770 Abnormality of albumin: Secondary | ICD-10-CM

## 2016-11-04 DIAGNOSIS — I10 Essential (primary) hypertension: Secondary | ICD-10-CM | POA: Diagnosis not present

## 2016-11-04 DIAGNOSIS — R740 Nonspecific elevation of levels of transaminase and lactic acid dehydrogenase [LDH]: Secondary | ICD-10-CM

## 2016-11-04 MED ORDER — HYDROCHLOROTHIAZIDE 12.5 MG PO TABS: 12.5000 mg | ORAL_TABLET | Freq: Every day | ORAL | 3 refills | Status: DC

## 2016-11-04 NOTE — Progress Notes (Signed)
Name: Kristopher Davis   MRN: [id number]    DOB: 1955-11-15   Date:11/04/2016       Progress Note  Subjective  Chief Complaint  Chief Complaint  Patient presents with  . Medication Refill    Chlorthalidone    HPI  HTN: he states he only came because his wife kept nagging him. He ran out of medication 2 months ago, but only his wife a couple of weeks ago. He states he has been feeling fine. No chest pain or palpitation. Appetite is normal, no longer has abdominal pain, he states he drinks beer 10-12 beers per week, mostly on weekends. He used to drink heavily many years ago when he was in [employer], but not since.    Patient Active Problem List   Diagnosis Date Noted  . Elevated AST (SGOT) 07/14/2015  . Prostate cancer screening 07/09/2015  . Medication monitoring encounter 07/09/2015  . Need for hepatitis C screening test 07/09/2015  . Tinea 08/11/2014  . 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 Disease of hair and hair follicles 53/61/4431  . Benign essential HTN 01/07/2009  . Fatigue 01/18/2008  . Decreased libido 02/01/2007  . CD (contact dermatitis) 10/10/2006    Past Surgical History:  Procedure Laterality Date  . LUMBAR LAMINECTOMY      Family History  Problem Relation Age of Onset  . Cancer Mother   . Anemia Daughter     Social History   Social History  . Marital status: Married    Spouse name: N/A  . Number of children: N/A  . Years of education: N/A   Occupational History  . Not on file.   Social History Main Topics  . Smoking status: Current Every Day Smoker    Packs/day: 0.25    Types: Cigarettes  . Smokeless tobacco: Never Used  . Alcohol use No  . Drug use: No  . Sexual activity: Yes    Partners: Female   Other Topics Concern  . Not on file   Social History Narrative  . No narrative on file     Current Outpatient Prescriptions:  .  aspirin EC 81 MG tablet, Take 1 tablet (81 mg total) by mouth daily., Disp: 30 tablet, Rfl: 11 .  hydrochlorothiazide  (HYDRODIURIL) 12.5 MG tablet, Take 1 tablet (12.5 mg total) by mouth daily., Disp: 90 tablet, Rfl: 3  No Known Allergies   ROS  Constitutional: Negative for fever or weight change.  Respiratory: Negative for cough and shortness of breath.   Cardiovascular: Negative for chest pain or palpitations.  Gastrointestinal: Negative for abdominal pain, no bowel changes.  Musculoskeletal: Negative for gait problem or joint swelling.  Skin: Negative for rash.  Neurological: Negative for dizziness or headache.  No other specific complaints in a complete review of systems (except as listed in HPI above).  Objective  Vitals:   11/04/16 1521  BP: (!) 146/88  Pulse: 95  Resp: 16  Temp: 98.6 F (37 C)  TempSrc: Oral  SpO2: 97%  Weight: 212 lb 9.6 oz (96.4 kg)  Height: 6' 4.5" (1.943 m)    Body mass index is 25.54 kg/m.  Physical Exam  Constitutional: Patient appears well-developed and well-nourished.  No distress.  HEENT: head atraumatic, normocephalic, pupils equal and reactive to light,  neck supple, throat within normal limits Cardiovascular: Normal rate, regular rhythm and normal heart sounds.  No murmur heard. No BLE edema. Pulmonary/Chest: Effort normal and breath sounds normal. No respiratory distress. Abdominal: Soft.  There is no tenderness.  Psychiatric: Patient has a normal mood and affect. behavior is normal. Judgment and thought content normal.  PHQ2/9: Depression screen [facility] 2/9 11/04/2016 04/12/2016 10/26/2015 10/16/2015 07/09/2015  Decreased Interest 0 0 0 0 0  Down, Depressed, Hopeless 0 0 0 0 0  PHQ - 2 Score 0 0 0 0 0     Fall Risk: Fall Risk  11/04/2016 04/12/2016 10/26/2015 10/16/2015 07/09/2015  Falls in the past year? 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 Benign essential HTN  He has been off medication for over 2 months and bp is only slightly elevated, he also does not want to return for regular appointments, we will switch to HCTZ and return in one year or prn for follow up. Explained importance of health checks but he is not sure if he will return for that - hydrochlorothiazide (HYDRODIURIL) 12.5 MG tablet; Take 1 tablet (12.5 mg total) by mouth daily.  Dispense: 90 tablet; Refill: 3 - COMPLETE METABOLIC PANEL WITH GFR  2. Elevated AST (SGOT)  - COMPLETE METABOLIC PANEL WITH GFR  3. Medication monitoring encounter  - COMPLETE METABOLIC PANEL WITH GFR  4. Abnormal albumin  - COMPLETE METABOLIC PANEL WITH GFR  5. Needs flu shot  refused

## 2016-11-05 LAB — COMPLETE METABOLIC PANEL WITH GFR
AG Ratio: 1.4 (calc) (ref 1.0–2.5)
ALKALINE PHOSPHATASE (APISO): 52 U/L (ref 40–115)
ALT: 26 U/L (ref 9–46)
AST: 34 U/L (ref 10–35)
Albumin: 3.9 g/dL (ref 3.6–5.1)
BUN: 11 mg/dL (ref 7–25)
CALCIUM: 9.1 mg/dL (ref 8.6–10.3)
CO2: 28 mmol/L (ref 20–32)
CREATININE: 0.99 mg/dL (ref 0.70–1.25)
Chloride: 107 mmol/L (ref 98–110)
GFR, EST NON AFRICAN AMERICAN: 82 mL/min/{1.73_m2} (ref >=60)
GFR, Est African American: 96 mL/min/{1.73_m2} (ref >=60)
GLOBULIN: 2.7 g/dL (ref 1.9–3.7)
GLUCOSE: 87 mg/dL (ref 65–139)
Potassium: 4.1 mmol/L (ref 3.5–5.3)
SODIUM: 141 mmol/L (ref 135–146)
Total Bilirubin: 0.6 mg/dL (ref 0.2–1.2)
Total Protein: 6.6 g/dL (ref 6.1–8.1)

## 2017-07-03 ENCOUNTER — Encounter

## 2017-07-03 ENCOUNTER — Encounter: Payer: Self-pay | Admitting: Nurse Practitioner

## 2017-07-03 ENCOUNTER — Ambulatory Visit: Payer: BLUE CROSS/BLUE SHIELD | Admitting: Nurse Practitioner

## 2017-07-03 VITALS — BP 138/84 | HR 93 | Temp 98.4°F | Resp 18 | Ht 77.0 in | Wt 219.0 lb

## 2017-07-03 DIAGNOSIS — R21 Rash and other nonspecific skin eruption: Secondary | ICD-10-CM | POA: Diagnosis not present

## 2017-07-03 DIAGNOSIS — B360 Pityriasis versicolor: Secondary | ICD-10-CM | POA: Diagnosis not present

## 2017-07-03 MED ORDER — TERBINAFINE HCL 250 MG PO TABS: 250.0000 mg | ORAL_TABLET | Freq: Every day | ORAL | 0 refills | Status: DC

## 2017-07-03 NOTE — Progress Notes (Addendum)
Name: Kristopher Davis   MRN: [id number]    DOB: [birth year]   Date:07/03/2017       Progress Note  Subjective  Chief Complaint  Chief Complaint  Patient presents with  . Rash    on forhead, arms, both sides and inner thighs fir 2 months, itches, scaly    HPI  Patient states noticed rash on both sides of chest early February of this year, over the past few months the rash has spread to bilateral arms and bilateral thighs. Rash is itchy, not painful. Patient has used calamine lotion- has helped minimize itching.  Denies new lotions, detergents, soaps, clothing. Has never had this before. denies fever, chills, insect bites, abdominal pain. sts keep areas clean and dry.    Patient Active Problem List   Diagnosis Date Noted  . Elevated AST (SGOT) 07/14/2015  . Prostate cancer screening 07/09/2015  . Medication monitoring encounter 07/09/2015  . Need for hepatitis C screening test 07/09/2015  . Tinea 08/11/2014  . 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 Disease of hair and hair follicles 00/93/8182  . Benign essential HTN 01/07/2009  . Fatigue 01/18/2008  . Decreased libido 02/01/2007  . CD (contact dermatitis) 10/10/2006    Past Medical History:  Diagnosis Date  . Decreased libido 02/01/2007  . Hypertension     Past Surgical History:  Procedure Laterality Date  . LUMBAR LAMINECTOMY      Social History   Tobacco Use  . Smoking status: Current Every Day Smoker    Packs/day: 0.25    Types: Cigarettes  . Smokeless tobacco: Never Used  Substance Use Topics  . Alcohol use: No    Alcohol/week: 0.0 oz     Current Outpatient Medications:  .  aspirin EC 81 MG tablet, Take 1 tablet (81 mg total) by mouth daily., Disp: 30 tablet, Rfl: 11 .  hydrochlorothiazide (HYDRODIURIL) 12.5 MG tablet, Take 1 tablet (12.5 mg total) by mouth daily., Disp: 90 tablet, Rfl: 3  No Known Allergies  ROS   Constitutional: Negative for fever or weight change.  Respiratory: Negative for cough and shortness of  breath.   Cardiovascular: Negative for chest pain or palpitations.  Gastrointestinal: Negative for abdominal pain, no bowel changes.  Musculoskeletal: Negative for gait problem or joint swelling.  Skin: Positive for rash.  Neurological: Negative for dizziness or headache.  No other specific complaints in a complete review of systems (except as listed in HPI above).  Objective  Vitals:   07/03/17 1557  BP: 138/84  Pulse: 93  Resp: 18  Temp: 98.4 F (36.9 C)  TempSrc: Oral  SpO2: 97%  Weight: 219 lb (99.3 kg)  Height: 6\' 5"  (1.956 m)     Body mass index is 25.97 kg/m.  Nursing Note and Vital Signs reviewed.  Physical Exam  Skin:        Constitutional: Patient appears well-developed and well-nourished. No distress.  Cardiovascular: Normal rate, regular rhythm, S1/S2 present.  No murmur or rub heard.  Pulmonary/Chest: Effort normal and breath sounds clear. No respiratory distress or retractions. Psychiatric: Patient has a normal mood and affect. behavior is normal. Judgment and thought content normal.  No results found for this or any previous visit (from the past 72 hour(s)).  Assessment & Plan  1. Rash - will treat with antifungal if unrelieved will send to derm.  - terbinafine (LAMISIL) 250 MG tablet; Take 1 tablet (250 mg total) by mouth daily.  Dispense: 14 tablet; Refill: 0  2. Tinea versicolor  - terbinafine (  LAMISIL) 250 MG tablet; Take 1 tablet (250 mg total) by mouth daily.  Dispense: 14 tablet; Refill: 0    -Red flags and when to present for emergency care or RTC including fever >101.49F, chest pain, shortness of breath, new/worsening/un-resolving symptoms, worsening rash reviewed with patient at time of visit. Follow up and care instructions discussed and provided in AVS.    I have reviewed this encounter including the documentation in this note and/or discussed this patient with the provider, Suezanne Cheshire DNP AGNP-C. I am certifying that I agree  with the content of this note as supervising physician. Steele Sizer, MD [facility] 07/03/2017, 5:12 PM

## 2017-07-03 NOTE — Patient Instructions (Signed)

## 2017-07-27 DIAGNOSIS — L308 Other specified dermatitis: Secondary | ICD-10-CM | POA: Diagnosis not present

## 2017-07-27 DIAGNOSIS — R21 Rash and other nonspecific skin eruption: Secondary | ICD-10-CM | POA: Diagnosis not present

## 2017-07-31 ENCOUNTER — Ambulatory Visit: Payer: BLUE CROSS/BLUE SHIELD | Admitting: Nurse Practitioner

## 2017-08-03 DIAGNOSIS — R21 Rash and other nonspecific skin eruption: Secondary | ICD-10-CM | POA: Diagnosis not present

## 2017-08-11 ENCOUNTER — Encounter: Payer: Self-pay | Admitting: Nurse Practitioner

## 2017-08-11 ENCOUNTER — Ambulatory Visit: Payer: BLUE CROSS/BLUE SHIELD | Admitting: Nurse Practitioner

## 2017-08-11 VITALS — BP 148/86 | HR 93 | Temp 99.1°F | Resp 16 | Ht 77.0 in | Wt 216.3 lb

## 2017-08-11 DIAGNOSIS — R21 Rash and other nonspecific skin eruption: Secondary | ICD-10-CM | POA: Diagnosis not present

## 2017-08-11 DIAGNOSIS — I10 Essential (primary) hypertension: Secondary | ICD-10-CM

## 2017-08-11 LAB — BASIC METABOLIC PANEL
BUN: 10 mg/dL (ref 7–25)
CO2: 28 mmol/L (ref 20–32)
Calcium: 8.9 mg/dL (ref 8.6–10.3)
Chloride: 104 mmol/L (ref 98–110)
Creat: 0.98 mg/dL (ref 0.70–1.25)
Glucose, Bld: 84 mg/dL (ref 65–139)
POTASSIUM: 3.6 mmol/L (ref 3.5–5.3)
Sodium: 139 mmol/L (ref 135–146)

## 2017-08-11 MED ORDER — CHLORTHALIDONE 25 MG PO TABS: 12.5000 mg | ORAL_TABLET | Freq: Every day | ORAL | 0 refills | Status: DC

## 2017-08-11 NOTE — Patient Instructions (Signed)
- Call us or bring in next Friday some blood pressures readings from home. Our Goal blood pressure is under 130/80   DASH Eating Plan DASH stands for "Dietary Approaches to Stop Hypertension." The DASH eating plan is a healthy eating plan that has been shown to reduce high blood pressure (hypertension). It may also reduce your risk for type 2 diabetes, heart disease, and stroke. The DASH eating plan may also help with weight loss. What are tips for following this plan? General guidelines  Avoid eating more than 2,300 mg (milligrams) of salt (sodium) a day. If you have hypertension, you may need to reduce your sodium intake to 1,500 mg a day.  Limit alcohol intake to no more than 1 drink a day for nonpregnant women and 2 drinks a day for men. One drink equals 12 oz of beer, 5 oz of wine, or 1 oz of hard liquor.  Work with your health care provider to maintain a healthy body weight or to lose weight. Ask what an ideal weight is for you.  Get at least 30 minutes of exercise that causes your heart to beat faster (aerobic exercise) most days of the week. Activities may include walking, swimming, or biking.  Work with your health care provider or diet and nutrition specialist (dietitian) to adjust your eating plan to your individual calorie needs. Reading food labels  Check food labels for the amount of sodium per serving. Choose foods with less than 5 percent of the Daily Value of sodium. Generally, foods with less than 300 mg of sodium per serving fit into this eating plan.  To find whole grains, look for the word "whole" as the first word in the ingredient list. Shopping  Buy products labeled as "low-sodium" or "no salt added."  Buy fresh foods. Avoid canned foods and premade or frozen meals. Cooking  Avoid adding salt when cooking. Use salt-free seasonings or herbs instead of table salt or sea salt. Check with your health care provider or pharmacist before using salt substitutes.  Do  not fry foods. Cook foods using healthy methods such as baking, boiling, grilling, and broiling instead.  Cook with heart-healthy oils, such as olive, canola, soybean, or sunflower oil. Meal planning   Eat a balanced diet that includes: ? 5 or more servings of fruits and vegetables each day. At each meal, try to fill half of your plate with fruits and vegetables. ? Up to 6-8 servings of whole grains each day. ? Less than 6 oz of lean meat, poultry, or fish each day. A 3-oz serving of meat is about the same size as a deck of cards. One egg equals 1 oz. ? 2 servings of low-fat dairy each day. ? A serving of nuts, seeds, or beans 5 times each week. ? Heart-healthy fats. Healthy fats called Omega-3 fatty acids are found in foods such as flaxseeds and coldwater fish, like sardines, salmon, and mackerel.  Limit how much you eat of the following: ? Canned or prepackaged foods. ? Food that is high in trans fat, such as fried foods. ? Food that is high in saturated fat, such as fatty meat. ? Sweets, desserts, sugary drinks, and other foods with added sugar. ? Full-fat dairy products.  Do not salt foods before eating.  Try to eat at least 2 vegetarian meals each week.  Eat more home-cooked food and less restaurant, buffet, and fast food.  When eating at a restaurant, ask that your food be prepared with less salt or  no salt, if possible. What foods are recommended? The items listed may not be a complete list. Talk with your dietitian about what dietary choices are best for you. Grains Whole-grain or whole-wheat bread. Whole-grain or whole-wheat pasta. Brown rice. [place]. Bulgur. Whole-grain and low-sodium cereals. Pita bread. Low-fat, low-sodium crackers. Whole-wheat flour tortillas. Vegetables Fresh or frozen vegetables (raw, steamed, roasted, or grilled). Low-sodium or reduced-sodium tomato and vegetable juice. Low-sodium or reduced-sodium tomato sauce and tomato paste. Low-sodium or  reduced-sodium canned vegetables. Fruits All fresh, dried, or frozen fruit. Canned fruit in natural juice (without added sugar). Meat and other protein foods Skinless chicken or [place]. Ground chicken or [place]. Pork with fat trimmed off. Fish and seafood. Egg whites. Dried beans, peas, or lentils. Unsalted nuts, nut butters, and seeds. Unsalted canned beans. Lean cuts of beef with fat trimmed off. Low-sodium, lean deli meat. Dairy Low-fat (1%) or fat-free (skim) milk. Fat-free, low-fat, or reduced-fat cheeses. Nonfat, low-sodium ricotta or cottage cheese. Low-fat or nonfat yogurt. Low-fat, low-sodium cheese. Fats and oils Soft margarine without trans fats. Vegetable oil. Low-fat, reduced-fat, or light mayonnaise and salad dressings (reduced-sodium). Canola, safflower, olive, soybean, and sunflower oils. Avocado. Seasoning and other foods Herbs. Spices. Seasoning mixes without salt. Unsalted popcorn and pretzels. Fat-free sweets. What foods are not recommended? The items listed may not be a complete list. Talk with your dietitian about what dietary choices are best for you. Grains Baked goods made with fat, such as croissants, muffins, or some breads. Dry pasta or rice meal packs. Vegetables Creamed or fried vegetables. Vegetables in a cheese sauce. Regular canned vegetables (not low-sodium or reduced-sodium). Regular canned tomato sauce and paste (not low-sodium or reduced-sodium). Regular tomato and vegetable juice (not low-sodium or reduced-sodium). Angie Fava. Olives. Fruits Canned fruit in a light or heavy syrup. Fried fruit. Fruit in cream or butter sauce. Meat and other protein foods Fatty cuts of meat. Ribs. Fried meat. Berniece Salines. Sausage. Bologna and other processed lunch meats. Salami. Fatback. Hotdogs. Bratwurst. Salted nuts and seeds. Canned beans with added salt. Canned or smoked fish. Whole eggs or egg yolks. Chicken or [place] with skin. Dairy Whole or 2% milk, cream, and half-and-half.  Whole or full-fat cream cheese. Whole-fat or sweetened yogurt. Full-fat cheese. Nondairy creamers. Whipped toppings. Processed cheese and cheese spreads. Fats and oils Butter. Stick margarine. Lard. Shortening. Ghee. Bacon fat. Tropical oils, such as coconut, palm kernel, or palm oil. Seasoning and other foods Salted popcorn and pretzels. Onion salt, garlic salt, seasoned salt, table salt, and sea salt. Worcestershire sauce. Tartar sauce. Barbecue sauce. Teriyaki sauce. Soy sauce, including reduced-sodium. Steak sauce. Canned and packaged gravies. Fish sauce. Oyster sauce. Cocktail sauce. Horseradish that you find on the shelf. Ketchup. Mustard. Meat flavorings and tenderizers. Bouillon cubes. Hot sauce and Tabasco sauce. Premade or packaged marinades. Premade or packaged taco seasonings. Relishes. Regular salad dressings. Where to find more information:  National Heart, Lung, and [place]: https://wilson-eaton.com/  American Heart Association: www.heart.org Summary  The DASH eating plan is a healthy eating plan that has been shown to reduce high blood pressure (hypertension). It may also reduce your risk for type 2 diabetes, heart disease, and stroke.  With the DASH eating plan, you should limit salt (sodium) intake to 2,300 mg a day. If you have hypertension, you may need to reduce your sodium intake to 1,500 mg a day.  When on the DASH eating plan, aim to eat more fresh fruits and vegetables, whole grains, lean proteins, low-fat dairy, and  heart-healthy fats.  Work with your health care provider or diet and nutrition specialist (dietitian) to adjust your eating plan to your individual calorie needs. This information is not intended to replace advice given to you by your health care provider. Make sure you discuss any questions you have with your health care provider. Document Released: 02/03/2011 Document Revised: 02/08/2016 Document Reviewed: 02/08/2016 Elsevier Interactive Patient Education   [employer].

## 2017-08-11 NOTE — Progress Notes (Addendum)
Name: Kristopher Davis   MRN: [id number]    DOB: 14-Jul-1955   Date:08/11/2017       Progress Note  Subjective  Chief Complaint  Chief Complaint  Patient presents with  . Hypertension    Takes BP medication every other day- because what his wife and him read about this medication concerned him- that it is what gave him the rash. Put him on a cream and drying out rash.    HPI  Patient has been taking HCTZ since September noticed rash in January on bilateral arms and legs- was seen here for that last month and sent to derm- rash has had significant improvement with kenalog cream but rash is still presents. Takes bp med every other day, if even because of concerned side effect.  Would like to go back to chlorthalidone 25mg  tab but sts was taken off of it because it was too strong. Denies lightheadedness, dizziness, headaches and chest pain.   Patient Active Problem List   Diagnosis Date Noted  . Elevated AST (SGOT) 07/14/2015  . Prostate cancer screening 07/09/2015  . Medication monitoring encounter 07/09/2015  . Need for hepatitis C screening test 07/09/2015  . Tinea 08/11/2014  . 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 Disease of hair and hair follicles 41/93/7902  . Benign essential HTN 01/07/2009  . Fatigue 01/18/2008  . Decreased libido 02/01/2007  . CD (contact dermatitis) 10/10/2006    Past Medical History:  Diagnosis Date  . Decreased libido 02/01/2007  . Hypertension     Past Surgical History:  Procedure Laterality Date  . LUMBAR LAMINECTOMY      Social History   Tobacco Use  . Smoking status: Current Every Day Smoker    Packs/day: 0.25    Types: Cigarettes  . Smokeless tobacco: Never Used  Substance Use Topics  . Alcohol use: No    Alcohol/week: 0.0 oz     Current Outpatient Medications:  .  aspirin EC 81 MG tablet, Take 1 tablet (81 mg total) by mouth daily., Disp: 30 tablet, Rfl: 11 .  triamcinolone ointment (KENALOG) 0.1 %, Apply topically 2 (two) times daily as needed for  rash., Disp: , Rfl:  .  chlorthalidone (HYGROTON) 25 MG tablet, Take 0.5 tablets (12.5 mg total) by mouth daily., Disp: 30 tablet, Rfl: 0 .  terbinafine (LAMISIL) 250 MG tablet, Take 1 tablet (250 mg total) by mouth daily. (Patient not taking: Reported on 08/11/2017), Disp: 14 tablet, Rfl: 0  No Known Allergies  ROS    No other specific complaints in a complete review of systems (except as listed in HPI above).  Objective  Vitals:   08/11/17 1608  BP: (!) 152/92  Pulse: 93  Resp: 16  Temp: 99.1 F (37.3 C)  TempSrc: Oral  SpO2: 98%  Weight: 216 lb 4.8 oz (98.1 kg)  Height: 6\' 5"  (1.956 m)    Body mass index is 25.65 kg/m.  Nursing Note and Vital Signs reviewed.  Physical Exam  Skin:       Constitutional: Patient appears well-developed and well-nourished. No distress.  Cardiovascular: Normal rate, regular rhythm, S1/S2 present.  No murmur or rub heard. Pulses intact Pulmonary/Chest: Effort normal and breath sounds clear. No respiratory distress or retractions. Psychiatric: Patient has a normal mood and affect. behavior is normal. Judgment and thought content normal.  No results found for this or any previous visit (from the past 72 hour(s)).  Assessment & Plan  1. Benign essential HTN Pt did not take BP meds today, BP  elevated. Pt vehemently wants to take old BP med- will do hlf dose. Check BPs at home this week, call them in and follow-up in one month  -DASH - chlorthalidone (HYGROTON) 25 MG tablet; Take 0.5 tablets (12.5 mg total) by mouth daily.  Dispense: 30 tablet; Refill: 0 - Basic Metabolic Panel (BMET)  2. Rash -improving with kenalog cream, continue cream and followup with derm   -Reviewed Health Maintenance: refusing cologuard or colonscopy   ---------------------------------------------- I have reviewed this encounter including the documentation in this note and/or discussed this patient with the provider, Suezanne Cheshire DNP AGNP-C. I am  certifying that I agree with the content of this note as supervising physician. Enid Derry, [facility] 08/11/2017, 5:29 PM

## 2017-08-21 ENCOUNTER — Telehealth: Payer: Self-pay | Admitting: Family Medicine

## 2017-08-21 NOTE — Telephone Encounter (Signed)
Copied from [place] [id number]. Topic: Quick Communication - See Telephone Encounter >> Aug 21, 2017  8:57 AM Burchel, Abbi R wrote: See Telephone encounter for: 08/21/17.  Pt called to let Suezanne Cheshire know that he is unable to check his BP regularly due to a change on his schedule.  He is now working 10hr shifts, and states he does not have access to somewhere/someone to check his Bp.  Please call pt back to advise.

## 2017-08-22 ENCOUNTER — Ambulatory Visit: Payer: Self-pay | Admitting: *Deleted

## 2017-08-22 NOTE — Telephone Encounter (Signed)
Pt called with symptoms of fatigue and having chills. Went to work and came back home because he was not feeling well. Not able to check temp. Was put on new b/p med on the 14 th of June. Unable to check b/p this morning, no equipment.  Denies any other symptoms.  Appointment made per protocol.  Home care advised. Also to call 911 if he start having any cardiac symptoms. Pt voiced understanding. Will route to flow at [facility].   Reason for Disposition . [1] MODERATE weakness (i.e., interferes with work, school, normal activities) AND [2] persists > 3 days  Answer Assessment - Initial Assessment Questions 1. DESCRIPTION: "Describe how you are feeling."     Weak, fatigued 2. SEVERITY: "How bad is it?"  "Can you stand and walk?"   - MILD - Feels weak or tired, but does not interfere with work, school or normal activities   - [place] to stand and walk; weakness interferes with work, school, or normal activities   - SEVERE - Unable to stand or walk     moderate 3. ONSET:  "When did the weakness begin?"     yesterday 4. CAUSE: "What do you think is causing the weakness?"     no 5. MEDICINES: "Have you recently started a new medicine or had a change in the amount of a medicine?"     Started new med about 2 weeks ago 6. OTHER SYMPTOMS: "Do you have any other symptoms?" (e.g., chest pain, fever, cough, SOB, vomiting, diarrhea, bleeding)     no  Protocols used: WEAKNESS (GENERALIZED) AND FATIGUE-A-AH

## 2017-08-23 ENCOUNTER — Ambulatory Visit: Payer: BLUE CROSS/BLUE SHIELD | Admitting: Nurse Practitioner

## 2017-08-23 ENCOUNTER — Emergency Department: Payer: BLUE CROSS/BLUE SHIELD

## 2017-08-23 ENCOUNTER — Observation Stay
Admission: EM | Admit: 2017-08-23 | Discharge: 2017-08-26 | DRG: 871 | Disposition: A | Discharge status: Home / Self Care | Payer: BLUE CROSS/BLUE SHIELD | Attending: Internal Medicine | Admitting: Internal Medicine

## 2017-08-23 ENCOUNTER — Encounter: Payer: Self-pay | Admitting: Emergency Medicine

## 2017-08-23 ENCOUNTER — Other Ambulatory Visit: Payer: Self-pay

## 2017-08-23 ENCOUNTER — Encounter: Payer: Self-pay | Admitting: Nurse Practitioner

## 2017-08-23 VITALS — BP 130/84 | HR 73 | Temp 101.5°F | Resp 18 | Ht 77.0 in | Wt 195.3 lb

## 2017-08-23 DIAGNOSIS — R634 Abnormal weight loss: Secondary | ICD-10-CM | POA: Diagnosis not present

## 2017-08-23 DIAGNOSIS — R197 Diarrhea, unspecified: Secondary | ICD-10-CM | POA: Diagnosis not present

## 2017-08-23 DIAGNOSIS — N289 Disorder of kidney and ureter, unspecified: Secondary | ICD-10-CM | POA: Diagnosis not present

## 2017-08-23 DIAGNOSIS — E876 Hypokalemia: Secondary | ICD-10-CM | POA: Diagnosis not present

## 2017-08-23 DIAGNOSIS — R918 Other nonspecific abnormal finding of lung field: Secondary | ICD-10-CM | POA: Diagnosis not present

## 2017-08-23 DIAGNOSIS — L270 Generalized skin eruption due to drugs and medicaments taken internally: Secondary | ICD-10-CM | POA: Diagnosis not present

## 2017-08-23 DIAGNOSIS — E860 Dehydration: Secondary | ICD-10-CM | POA: Diagnosis present

## 2017-08-23 DIAGNOSIS — N179 Acute kidney failure, unspecified: Secondary | ICD-10-CM | POA: Diagnosis not present

## 2017-08-23 DIAGNOSIS — I10 Essential (primary) hypertension: Secondary | ICD-10-CM | POA: Diagnosis not present

## 2017-08-23 DIAGNOSIS — E43 Unspecified severe protein-calorie malnutrition: Secondary | ICD-10-CM | POA: Diagnosis present

## 2017-08-23 DIAGNOSIS — A419 Sepsis, unspecified organism: Secondary | ICD-10-CM | POA: Diagnosis not present

## 2017-08-23 DIAGNOSIS — J181 Lobar pneumonia, unspecified organism: Secondary | ICD-10-CM | POA: Diagnosis present

## 2017-08-23 DIAGNOSIS — R410 Disorientation, unspecified: Secondary | ICD-10-CM | POA: Diagnosis not present

## 2017-08-23 DIAGNOSIS — T502X5A Adverse effect of carbonic-anhydrase inhibitors, benzothiadiazides and other diuretics, initial encounter: Secondary | ICD-10-CM | POA: Diagnosis not present

## 2017-08-23 DIAGNOSIS — R441 Visual hallucinations: Secondary | ICD-10-CM | POA: Diagnosis not present

## 2017-08-23 DIAGNOSIS — F17210 Nicotine dependence, cigarettes, uncomplicated: Secondary | ICD-10-CM | POA: Diagnosis not present

## 2017-08-23 DIAGNOSIS — R531 Weakness: Secondary | ICD-10-CM

## 2017-08-23 DIAGNOSIS — R5383 Other fatigue: Secondary | ICD-10-CM | POA: Diagnosis not present

## 2017-08-23 DIAGNOSIS — R509 Fever, unspecified: Secondary | ICD-10-CM | POA: Diagnosis not present

## 2017-08-23 DIAGNOSIS — R443 Hallucinations, unspecified: Secondary | ICD-10-CM | POA: Diagnosis not present

## 2017-08-23 DIAGNOSIS — Z6824 Body mass index (BMI) 24.0-24.9, adult: Secondary | ICD-10-CM | POA: Diagnosis not present

## 2017-08-23 DIAGNOSIS — E538 Deficiency of other specified B group vitamins: Secondary | ICD-10-CM | POA: Diagnosis present

## 2017-08-23 DIAGNOSIS — J189 Pneumonia, unspecified organism: Secondary | ICD-10-CM | POA: Diagnosis present

## 2017-08-23 LAB — BLOOD GAS, VENOUS
Acid-Base Excess: 0.7 mmol/L (ref 0.0–2.0)
BICARBONATE: 26 mmol/L (ref 20.0–28.0)
PH VEN: 7.39 (ref 7.250–7.430)
Patient temperature: 37
pCO2, Ven: 43 mmHg — ABNORMAL LOW (ref 44.0–60.0)

## 2017-08-23 LAB — URINALYSIS, COMPLETE (UACMP) WITH MICROSCOPIC
Bacteria, UA: NONE SEEN
Bilirubin Urine: NEGATIVE
GLUCOSE, UA: NEGATIVE mg/dL
Ketones, ur: NEGATIVE mg/dL
LEUKOCYTES UA: NEGATIVE
NITRITE: NEGATIVE
PH: 5 (ref 5.0–8.0)
Protein, ur: NEGATIVE mg/dL
SPECIFIC GRAVITY, URINE: 1.033 — AB (ref 1.005–1.030)

## 2017-08-23 LAB — BASIC METABOLIC PANEL
Anion gap: 13 (ref 5–15)
BUN: 16 mg/dL (ref 8–23)
CALCIUM: 8.6 mg/dL — AB (ref 8.9–10.3)
CHLORIDE: 96 mmol/L — AB (ref 98–111)
CO2: 23 mmol/L (ref 22–32)
Creatinine, Ser: 1.52 mg/dL — ABNORMAL HIGH (ref 0.61–1.24)
GFR calc Af Amer: 55 mL/min — ABNORMAL LOW (ref >60)
GFR, EST NON AFRICAN AMERICAN: 48 mL/min — AB (ref >60)
GLUCOSE: 136 mg/dL — AB (ref 70–99)
POTASSIUM: 2.8 mmol/L — AB (ref 3.5–5.1)
Sodium: 132 mmol/L — ABNORMAL LOW (ref 135–145)

## 2017-08-23 LAB — CBC
HCT: 47.1 % (ref 40.0–52.0)
Hemoglobin: 16.6 g/dL (ref 13.0–18.0)
MCH: 33.2 pg (ref 26.0–34.0)
MCHC: 35.3 g/dL (ref 32.0–36.0)
MCV: 94.2 fL (ref 80.0–100.0)
Platelets: 254 10*3/uL (ref 150–440)
RBC: 5 MIL/uL (ref 4.40–5.90)
RDW: 13.5 % (ref 11.5–14.5)
WBC: 13.6 10*3/uL — AB (ref 3.8–10.6)

## 2017-08-23 LAB — MAGNESIUM: MAGNESIUM: 1.8 mg/dL (ref 1.7–2.4)

## 2017-08-23 LAB — RAPID HIV SCREEN (HIV 1/2 AB+AG)
HIV 1/2 ANTIBODIES: NONREACTIVE
HIV-1 P24 Antigen - HIV24: NONREACTIVE

## 2017-08-23 LAB — INFLUENZA PANEL BY PCR (TYPE A & B)
Influenza A By PCR: NEGATIVE
Influenza B By PCR: NEGATIVE

## 2017-08-23 LAB — TSH: TSH: 1.194 u[IU]/mL (ref 0.350–4.500)

## 2017-08-23 LAB — TROPONIN I: Troponin I: <0.03 ng/mL (ref <0.03)

## 2017-08-23 LAB — SEDIMENTATION RATE: Sed Rate: 6 mm/hr (ref 0–20)

## 2017-08-23 LAB — C-REACTIVE PROTEIN: CRP: 16.8 mg/dL — ABNORMAL HIGH (ref <1.0)

## 2017-08-23 LAB — LACTIC ACID, PLASMA: LACTIC ACID, VENOUS: 1.9 mmol/L (ref 0.5–1.9)

## 2017-08-23 MED ORDER — LEVOFLOXACIN IN D5W 750 MG/150ML IV SOLN
750.0000 mg | Freq: Once | INTRAVENOUS | Status: AC
  Administered 2017-08-23: 750 mg via INTRAVENOUS
  Filled 2017-08-23: qty 150

## 2017-08-23 MED ORDER — IOPAMIDOL (ISOVUE-300) INJECTION 61%
100.0000 mL | Freq: Once | INTRAVENOUS | Status: AC | PRN
  Administered 2017-08-23: 100 mL via INTRAVENOUS

## 2017-08-23 MED ORDER — ONDANSETRON HCL 4 MG/2ML IJ SOLN: 4.0000 mg | Freq: Four times a day (QID) | INTRAMUSCULAR | Status: DC | PRN

## 2017-08-23 MED ORDER — SODIUM CHLORIDE 0.9% FLUSH
3.0000 mL | Freq: Two times a day (BID) | INTRAVENOUS | Status: DC
  Administered 2017-08-23 – 2017-08-26 (×7): 3 mL via INTRAVENOUS

## 2017-08-23 MED ORDER — POLYETHYLENE GLYCOL 3350 17 G PO PACK: 17.0000 g | PACK | Freq: Every day | ORAL | Status: DC | PRN

## 2017-08-23 MED ORDER — ALBUTEROL SULFATE (2.5 MG/3ML) 0.083% IN NEBU: 2.5000 mg | INHALATION_SOLUTION | RESPIRATORY_TRACT | Status: DC | PRN

## 2017-08-23 MED ORDER — ACETAMINOPHEN 650 MG RE SUPP: 650.0000 mg | Freq: Four times a day (QID) | RECTAL | Status: DC | PRN

## 2017-08-23 MED ORDER — SODIUM CHLORIDE 0.9 % IV BOLUS
1000.0000 mL | Freq: Once | INTRAVENOUS | Status: AC
  Administered 2017-08-23: 1000 mL via INTRAVENOUS

## 2017-08-23 MED ORDER — ONDANSETRON HCL 4 MG PO TABS: 4.0000 mg | ORAL_TABLET | Freq: Four times a day (QID) | ORAL | Status: DC | PRN

## 2017-08-23 MED ORDER — LEVOFLOXACIN IN D5W 750 MG/150ML IV SOLN
750.0000 mg | INTRAVENOUS | Status: DC
  Administered 2017-08-24: 750 mg via INTRAVENOUS
  Filled 2017-08-23: qty 150

## 2017-08-23 MED ORDER — ACETAMINOPHEN 325 MG PO TABS
650.0000 mg | ORAL_TABLET | Freq: Four times a day (QID) | ORAL | Status: DC | PRN
  Administered 2017-08-23 – 2017-08-24 (×2): 650 mg via ORAL
  Filled 2017-08-23 (×2): qty 2

## 2017-08-23 MED ORDER — POTASSIUM CHLORIDE CRYS ER 20 MEQ PO TBCR
40.0000 meq | EXTENDED_RELEASE_TABLET | ORAL | Status: AC
  Administered 2017-08-23 (×2): 40 meq via ORAL
  Filled 2017-08-23 (×2): qty 2

## 2017-08-23 MED ORDER — IBUPROFEN 400 MG PO TABS
400.0000 mg | ORAL_TABLET | Freq: Four times a day (QID) | ORAL | Status: DC | PRN
  Administered 2017-08-23: 18:00:00 400 mg via ORAL
  Filled 2017-08-23: qty 1

## 2017-08-23 MED ORDER — SODIUM CHLORIDE 0.9 % IV SOLN
Freq: Once | INTRAVENOUS | Status: AC
  Administered 2017-08-23: 13:00:00 via INTRAVENOUS

## 2017-08-23 MED ORDER — KCL IN DEXTROSE-NACL 20-5-0.45 MEQ/L-%-% IV SOLN
Freq: Once | INTRAVENOUS | Status: AC
  Administered 2017-08-23: 13:00:00 via INTRAVENOUS
  Filled 2017-08-23: qty 1000

## 2017-08-23 MED ORDER — POTASSIUM CHLORIDE CRYS ER 20 MEQ PO TBCR
40.0000 meq | EXTENDED_RELEASE_TABLET | Freq: Once | ORAL | Status: AC
  Administered 2017-08-23: 40 meq via ORAL
  Filled 2017-08-23: qty 2

## 2017-08-23 MED ORDER — ENOXAPARIN SODIUM 40 MG/0.4ML ~~LOC~~ SOLN
40.0000 mg | SUBCUTANEOUS | Status: DC
  Filled 2017-08-23: qty 0.4

## 2017-08-23 NOTE — ED Triage Notes (Signed)
21lb weight loss over the past few weeks, fever for the past few days and fatigue, ruled out for tick bites at Dr, diarrhea yesterday, denies nausea/vomiting. Denies cp.

## 2017-08-23 NOTE — Addendum Note (Signed)
Addended by: Fredderick Severance on: 08/23/2017 05:01 PM   Modules accepted: Orders

## 2017-08-23 NOTE — ED Notes (Signed)
First Nurse Note:  Patient wearing face mask.  Hx of weakness and being in bed since Mon. PM with chills.  In [place].

## 2017-08-23 NOTE — ED Provider Notes (Signed)
[facility] Emergency Department Provider Note       Time seen: ----------------------------------------- 11:31 AM on 08/23/2017 -----------------------------------------   I have reviewed the triage vital signs and the nursing notes.  HISTORY   Chief Complaint Fatigue   HPI Kristopher Davis is a 62 y.o. male with a history of hypertension, fatigue who presents to the ED for 21 pound weight loss over the past several weeks with fever over the past few days and weakness.  He was seen by his doctor recently and ruled out for tickborne illnesses.  He did have diarrhea yesterday but denies nausea or vomiting.  He denies any current headache, he does has profound weakness with fever at this time.  He did have diarrhea as his other most recent symptoms but otherwise denies congestion, cough, chest pain, back pain, abdominal pain.  Past Medical History:  Diagnosis Date  . Decreased libido 02/01/2007  . Hypertension     Patient Active Problem List   Diagnosis Date Noted  . Elevated AST (SGOT) 07/14/2015  . Prostate cancer screening 07/09/2015  . Medication monitoring encounter 07/09/2015  . Need for hepatitis C screening test 07/09/2015  . Tinea 08/11/2014  . 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 Disease of hair and hair follicles 16/11/9602  . Benign essential HTN 01/07/2009  . Fatigue 01/18/2008  . Decreased libido 02/01/2007  . CD (contact dermatitis) 10/10/2006    Past Surgical History:  Procedure Laterality Date  . LUMBAR LAMINECTOMY      Allergies Patient has no known allergies.  Social History Social History   Tobacco Use  . Smoking status: Current Every Day Smoker    Packs/day: 0.25    Types: Cigarettes  . Smokeless tobacco: Never Used  Substance Use Topics  . Alcohol use: No    Alcohol/week: 0.0 oz  . Drug use: No   Review of Systems Constitutional: Positive for fever and chills ENT:  Negative for congestion, sore throat Cardiovascular: Negative for  chest pain. Respiratory: Negative for shortness of breath. Gastrointestinal: Negative for abdominal pain, positive for recent diarrhea Genitourinary: Negative for dysuria. Musculoskeletal: Negative for back pain. Skin: Positive for recent skin rash Neurological: Negative for headaches, positive for generalized weakness  All systems negative/normal/unremarkable except as stated in the HPI  ____________________________________________   PHYSICAL EXAM:  VITAL SIGNS: ED Triage Vitals  Enc Vitals Group     BP 08/23/17 1017 (!) 144/80     Pulse Rate 08/23/17 1017 (!) 113     Resp 08/23/17 1017 18     Temp 08/23/17 1017 (!) 100.4 F (38 C)     Temp Source 08/23/17 1017 Oral     SpO2 08/23/17 1017 100 %     Weight 08/23/17 1017 195 lb (88.5 kg)     Height 08/23/17 1017 6\' 4"  (1.93 m)     Head Circumference --      Peak Flow --      Pain Score 08/23/17 1024 0     Pain Loc --      Pain Edu? --      Excl. in [place]? --    Constitutional: Alert and oriented.  Mild distress Eyes: Conjunctivae are normal. Normal extraocular movements. ENT   Head: Normocephalic and atraumatic.   Nose: No congestion/rhinnorhea.   Mouth/Throat: Mucous membranes are moist.   Neck: No stridor. Cardiovascular: Rapid rate, regular rhythm. No murmurs, rubs, or gallops. Respiratory: Normal respiratory effort without tachypnea nor retractions. Breath sounds are clear and equal bilaterally. No wheezes/rales/rhonchi.  Gastrointestinal: Soft and nontender. Normal bowel sounds Musculoskeletal: Nontender with normal range of motion in extremities. No lower extremity tenderness nor edema. Neurologic:  Normal speech and language. No gross focal neurologic deficits are appreciated.  Skin:  Skin is warm, dry and intact. No rash noted. Psychiatric: Mood and affect are normal. Speech and behavior are normal.  ____________________________________________  EKG: Interpreted by me.  Sinus tachycardia with a rate of  116 bpm, voltage criteria for LVH, normal QT, normal axis  ____________________________________________  ED COURSE:  As part of my medical decision making, I reviewed the following data within the [place] History obtained from family if available, nursing notes, old chart and ekg, as well as notes from prior ED visits. Patient presented for fever of unknown origin, we will assess with labs and imaging as indicated at this time.   Procedures ____________________________________________   LABS (pertinent positives/negatives)  Labs Reviewed  CBC - Abnormal; Notable for the following components:      Result Value   WBC 13.6 (*)    All other components within normal limits  BASIC METABOLIC PANEL - Abnormal; Notable for the following components:   Sodium 132 (*)    Potassium 2.8 (*)    Chloride 96 (*)    Glucose, Bld 136 (*)    Creatinine, Ser 1.52 (*)    Calcium 8.6 (*)    GFR calc non Af Amer 48 (*)    GFR calc Af Amer 55 (*)    All other components within normal limits  BLOOD GAS, VENOUS - Abnormal; Notable for the following components:   pCO2, Ven 43 (*)    pO2, Ven <31.0 (*)    All other components within normal limits  CULTURE, BLOOD (ROUTINE X 2)  CULTURE, BLOOD (ROUTINE X 2)  LACTIC ACID, PLASMA  TROPONIN I  RAPID HIV SCREEN (HIV 1/2 AB+AG)  SEDIMENTATION RATE  MAGNESIUM  TSH  INFLUENZA PANEL BY PCR (TYPE A & B)  URINALYSIS, COMPLETE (UACMP) WITH MICROSCOPIC  C-REACTIVE PROTEIN  CBG MONITORING, ED   CRITICAL CARE Performed by: Laurence Aly   Total critical care time: 30 minutes  Critical care time was exclusive of separately billable procedures and treating other patients.  Critical care was necessary to treat or prevent imminent or life-threatening deterioration.  Critical care was time spent personally by me on the following activities: development of treatment plan with patient and/or surrogate as well as nursing, discussions  with consultants, evaluation of patient's response to treatment, examination of patient, obtaining history from patient or surrogate, ordering and performing treatments and interventions, ordering and review of laboratory studies, ordering and review of radiographic studies, pulse oximetry and re-evaluation of patient's condition.  RADIOLOGY Images were viewed by me  Chest x-ray IMPRESSION: Patchy airspace opacity in the left lower lobe. This may reflect pneumonia. Followup PA and lateral chest X-ray is recommended in 3-4 weeks following trial of antibiotic therapy to ensure resolution and exclude underlying malignancy. CT of the chest/abdomen and pelvis IMPRESSION: Left lower lobe pneumonia. Minimal, patchy involvement also in the right lower lobe. No dense consolidation or lobar collapse. No pleural effusion.  Liquid stool, consistent with the clinical history of diarrhea. No specific abdominal finding of acute significance.  Aortic atherosclerosis. ____________________________________________  DIFFERENTIAL DIAGNOSIS   Occult infection, tickborne illness, TB, occult cancer, infectious disease  FINAL ASSESSMENT AND PLAN  Fever, weight loss, pneumonia, renal insufficiency   Plan: The patient had presented for fever and weight loss. Patient's  labs did indicate multiple abnormalities including mild hypokalemia and mild renal insufficiency with leukocytosis. Patient's imaging she was concerning for left lower lobe pneumonia versus malignancy.  On CT this does appear to be pneumonia although he has not really had symptoms of pneumonia.  I have ordered IV Levaquin, he would likely benefit from infectious disease consult and observation.   Laurence Aly, MD   Note: This note was generated in part or whole with voice recognition software. Voice recognition is usually quite accurate but there are transcription errors that can and very often do occur. I apologize for any  typographical errors that were not detected and corrected.     Earleen Newport, MD 08/23/17 [id number]

## 2017-08-23 NOTE — ED Notes (Signed)
Patient returned from radiology

## 2017-08-23 NOTE — ED Notes (Signed)
First Nurse Note:  Lab called Maude Leriche to request re-draw of green tube, states hemolysis.  Derl Barrow RN aware of need to re-draw.

## 2017-08-23 NOTE — H&P (Signed)
[facility] NAME: Kristopher Davis    MR#:  [id number]  DATE OF BIRTH:  1955-09-17  DATE OF ADMISSION:  08/23/2017  PRIMARY CARE PHYSICIAN: Steele Sizer, MD   REQUESTING/REFERRING PHYSICIAN: Dr. Jimmye Norman  CHIEF COMPLAINT:   Chief Complaint  Patient presents with  . Fatigue    HISTORY OF PRESENT ILLNESS:  [name]  is a 62 y.o. male with a known history of hypertension on chlorthalidone presents to the emergency room due to worsening fatigue and weight loss over the past 3 weeks.  Patient has noticed 21 pound weight loss.  Loss of appetite.  No shortness of breath or cough.  But temperature as high as 103 at home.  Here patient has met sepsis criteria with fever, tachycardia and elevated WBC.  Chest x-ray showing bilateral infiltrates.  He has had 3 episodes of watery diarrhea yesterday which is resolved.  No abdominal tenderness.  No blood in stool.  Patient had CT scan of the abdomen and pelvis along with the chest due to his weight loss.  No tumors.  Showed bilateral infiltrates.  PAST MEDICAL HISTORY:   Past Medical History:  Diagnosis Date  . Decreased libido 02/01/2007  . Hypertension     PAST SURGICAL HISTORY:   Past Surgical History:  Procedure Laterality Date  . LUMBAR LAMINECTOMY      SOCIAL HISTORY:   Social History   Tobacco Use  . Smoking status: Current Every Day Smoker    Packs/day: 0.25    Types: Cigarettes  . Smokeless tobacco: Never Used  Substance Use Topics  . Alcohol use: No    Alcohol/week: 0.0 oz    FAMILY HISTORY:   Family History  Problem Relation Age of Onset  . Cancer Mother   . Anemia Daughter     DRUG ALLERGIES:  No Known Allergies  REVIEW OF SYSTEMS:   Review of Systems  Constitutional: Positive for chills, fever, malaise/fatigue and weight loss.  HENT: Negative for hearing loss and nosebleeds.   Eyes: Negative for blurred vision, double vision and pain.  Respiratory:  Negative for cough, hemoptysis, sputum production, shortness of breath and wheezing.   Cardiovascular: Negative for chest pain, palpitations, orthopnea and leg swelling.  Gastrointestinal: Negative for abdominal pain, constipation, diarrhea, nausea and vomiting.  Genitourinary: Negative for dysuria and hematuria.  Musculoskeletal: Negative for back pain, falls and myalgias.  Skin: Negative for rash.  Neurological: Negative for dizziness, tremors, sensory change, speech change, focal weakness, seizures and headaches.  Endo/Heme/Allergies: Does not bruise/bleed easily.  Psychiatric/Behavioral: Negative for depression and memory loss. The patient is not nervous/anxious.     MEDICATIONS AT HOME:   Prior to Admission medications   Medication Sig Start Date End Date Taking? Authorizing Provider  aspirin EC 81 MG tablet Take 1 tablet (81 mg total) by mouth daily. 07/09/15  Yes Lada, Satira Anis, MD  chlorthalidone (HYGROTON) 25 MG tablet Take 0.5 tablets (12.5 mg total) by mouth daily. 08/11/17  Yes Poulose, Bethel Born, NP  terbinafine (LAMISIL) 250 MG tablet Take 1 tablet (250 mg total) by mouth daily. Patient not taking: Reported on 08/11/2017 07/03/17   Fredderick Severance, NP     VITAL SIGNS:  Blood pressure 135/78, pulse 80, temperature (!) 100.4 F (38 C), temperature source Oral, resp. rate (!) 22, height '6\' 4"'  (1.93 m), weight 88.5 kg (195 lb), SpO2 100 %.  PHYSICAL EXAMINATION:  Physical Exam  GENERAL:  62 y.o.-year-old patient lying  in the bed with no acute distress.  EYES: Pupils equal, round, reactive to light and accommodation. No scleral icterus. Extraocular muscles intact.  HEENT: Head atraumatic, normocephalic. Oropharynx and nasopharynx clear. No oropharyngeal erythema, moist oral mucosa  NECK:  Supple, no jugular venous distention. No thyroid enlargement, no tenderness.  LUNGS: Normal breath sounds bilaterally, no wheezing, rales, rhonchi. No use of accessory muscles of  respiration.  CARDIOVASCULAR: S1, S2 normal. No murmurs, rubs, or gallops.  ABDOMEN: Soft, nontender, nondistended. Bowel sounds present. No organomegaly or mass.  EXTREMITIES: No pedal edema, cyanosis, or clubbing. + 2 pedal & radial pulses b/l.   NEUROLOGIC: Cranial nerves II through XII are intact. No focal Motor or sensory deficits appreciated b/l PSYCHIATRIC: The patient is alert and oriented x 3. Good affect.  SKIN: No obvious rash, lesion, or ulcer.   LABORATORY PANEL:   CBC Recent Labs  Lab Aug 30, 2017 1031  WBC 13.6*  HGB 16.6  HCT 47.1  PLT 254   ------------------------------------------------------------------------------------------------------------------  Chemistries  Recent Labs  Lab 08-30-2017 1125 2017-08-30 1208  NA 132*  --   K 2.8*  --   CL 96*  --   CO2 23  --   GLUCOSE 136*  --   BUN 16  --   CREATININE 1.52*  --   CALCIUM 8.6*  --   MG  --  1.8   ------------------------------------------------------------------------------------------------------------------  Cardiac Enzymes Recent Labs  Lab 08/30/2017 1132  TROPONINI <0.03   ------------------------------------------------------------------------------------------------------------------  RADIOLOGY:  Dg Chest 2 View  Result Date: 08-30-17 CLINICAL DATA:  21 pound weight loss over the past several weeks. Fever and fatigue for the past several days. Onset of diarrhea yesterday. Current smoker. History of hypertension EXAM: CHEST - 2 VIEW COMPARISON:  None in PACs FINDINGS: The lungs are adequately inflated. There is patchy increased density at the left lung base likely posteriorly. The heart and pulmonary vascularity are normal. The mediastinum is normal in width. The bony thorax exhibits no acute abnormality. IMPRESSION: Patchy airspace opacity in the left lower lobe. This may reflect pneumonia. Followup PA and lateral chest X-ray is recommended in 3-4 weeks following trial of antibiotic therapy to  ensure resolution and exclude underlying malignancy. Electronically Signed   By: David  Martinique M.D.   On: 08-30-17 12:42   Ct Chest W Contrast  Result Date: 2017-08-30 CLINICAL DATA:  Pronounced weight loss over the last several weeks. Fever. EXAM: CT CHEST, ABDOMEN, AND PELVIS WITH CONTRAST TECHNIQUE: Multidetector CT imaging of the chest, abdomen and pelvis was performed following the standard protocol during bolus administration of intravenous contrast. CONTRAST:  118m ISOVUE-300 IOPAMIDOL (ISOVUE-300) INJECTION 61% COMPARISON:  None. FINDINGS: CT CHEST FINDINGS Cardiovascular: Heart size is normal. Fleck like coronary artery calcification is seen, without advanced finding. No pericardial fluid. No thoracic aortic atherosclerosis. Pulmonary arteries as seen appear normal. Mediastinum/Nodes: No mediastinal or hilar mass or lymphadenopathy. Lungs/Pleura: There is bilateral bronchopneumonia, most pronounced in the posterior left lower lobe, but with small areas of patchy involvement also in the right lower lobe. No dense consolidation or lobar collapse. No pleural effusion. Musculoskeletal: Negative CT ABDOMEN PELVIS FINDINGS Hepatobiliary: Liver parenchyma is normal. Insignificant subcentimeter cyst at the caudal tip of the right lobe. No calcified gallstones. Pancreas: Normal Spleen: Normal Adrenals/Urinary Tract: Adrenal glands are normal. Kidneys are normal. No cyst, mass, stone or hydronephrosis. Bladder is normal. Stomach/Bowel: Liquid stool is noted. There is no evidence of obstruction or focal lesion however. The appendix is normal. Vascular/Lymphatic: Aortic atherosclerosis.  No aneurysm. IVC is normal. No retroperitoneal adenopathy. Reproductive: Normal Other: No free fluid or air. Musculoskeletal: Ordinary lower lumbar degenerative changes. IMPRESSION: Left lower lobe pneumonia. Minimal, patchy involvement also in the right lower lobe. No dense consolidation or lobar collapse. No pleural effusion.  Liquid stool, consistent with the clinical history of diarrhea. No specific abdominal finding of acute significance. Aortic atherosclerosis. Electronically Signed   By: Nelson Chimes M.D.   On: 2017/09/04 14:28   Ct Abdomen Pelvis W Contrast  Result Date: 09/04/2017 CLINICAL DATA:  Pronounced weight loss over the last several weeks. Fever. EXAM: CT CHEST, ABDOMEN, AND PELVIS WITH CONTRAST TECHNIQUE: Multidetector CT imaging of the chest, abdomen and pelvis was performed following the standard protocol during bolus administration of intravenous contrast. CONTRAST:  17m ISOVUE-300 IOPAMIDOL (ISOVUE-300) INJECTION 61% COMPARISON:  None. FINDINGS: CT CHEST FINDINGS Cardiovascular: Heart size is normal. Fleck like coronary artery calcification is seen, without advanced finding. No pericardial fluid. No thoracic aortic atherosclerosis. Pulmonary arteries as seen appear normal. Mediastinum/Nodes: No mediastinal or hilar mass or lymphadenopathy. Lungs/Pleura: There is bilateral bronchopneumonia, most pronounced in the posterior left lower lobe, but with small areas of patchy involvement also in the right lower lobe. No dense consolidation or lobar collapse. No pleural effusion. Musculoskeletal: Negative CT ABDOMEN PELVIS FINDINGS Hepatobiliary: Liver parenchyma is normal. Insignificant subcentimeter cyst at the caudal tip of the right lobe. No calcified gallstones. Pancreas: Normal Spleen: Normal Adrenals/Urinary Tract: Adrenal glands are normal. Kidneys are normal. No cyst, mass, stone or hydronephrosis. Bladder is normal. Stomach/Bowel: Liquid stool is noted. There is no evidence of obstruction or focal lesion however. The appendix is normal. Vascular/Lymphatic: Aortic atherosclerosis. No aneurysm. IVC is normal. No retroperitoneal adenopathy. Reproductive: Normal Other: No free fluid or air. Musculoskeletal: Ordinary lower lumbar degenerative changes. IMPRESSION: Left lower lobe pneumonia. Minimal, patchy  involvement also in the right lower lobe. No dense consolidation or lobar collapse. No pleural effusion. Liquid stool, consistent with the clinical history of diarrhea. No specific abdominal finding of acute significance. Aortic atherosclerosis. Electronically Signed   By: MNelson ChimesM.D.   On: 02019/08/812:28   IMPRESSION AND PLAN:   * Bilateral pneumonia with sepsis Start IV levaquin Blood cx sent Nebs PRn O2 as needed  * Weight loss CT abdomen and chest showed only PNA No tumors Check TSH, HIV  * HTN Continue chlorthalidone  *Hypokalemia due to chlorthalidone.  Will replace orally.  Needs supplementation at discharge.  *Mild diarrhea.  Etiology unclear.  No abdominal pain or tenderness.  Normal WBC. Resolved  DVT prophylaxis with Lovenox   All the records are reviewed and case discussed with ED provider. Management plans discussed with the patient, family and they are in agreement.  CODE STATUS: FULL CODE  TOTAL TIME TAKING CARE OF THIS PATIENT: 40 minutes.   SLeia AlfSudini M.D on 608-Jul-2019at 2:58 PM  Between 7am to 6pm - Pager - [phone]  After 6pm go to [email or web address] - password [email or web address]  Office  [phone] CC: Primary care physician; SSteele Sizer MD  Note: This dictation was prepared with Dragon dictation along with smaller phrase technology. Any transcriptional errors that result from this process are unintentional.

## 2017-08-23 NOTE — Progress Notes (Signed)
Pharmacy Antibiotic Note  Desmund Elman is a 62 y.o. male admitted on 08/23/2017 with pneumonia.  Pharmacy has been consulted for Levaquin dosing.  Plan: Levaquin 750 mg daily.   Height: 6\' 4"  (193 cm) Weight: 195 lb (88.5 kg) IBW/kg (Calculated) : 86.8  Temp (24hrs), Avg:100.6 F (38.1 C), Min:100.1 F (37.8 C), Max:101.5 F (38.6 C)  Recent Labs  Lab 08/23/17 1031 08/23/17 1125 08/23/17 1208  WBC 13.6*  --   --   CREATININE  --  1.52*  --   LATICACIDVEN  --   --  1.9    Estimated Creatinine Clearance: 62.7 mL/min (A) (by C-G formula based on SCr of 1.52 mg/dL (H)).    No Known Allergies  Antimicrobials this admission: Levaquin 6/26 >>   Dose adjustments this admission:  Microbiology results: 6/26 BCx: sent  Thank you for allowing pharmacy to be a part of this patient's care.  Napoleon Form 08/23/2017 7:43 PM

## 2017-08-23 NOTE — Patient Instructions (Signed)
Please go to ER

## 2017-08-23 NOTE — Progress Notes (Signed)
Name: Kristopher Davis   MRN: [id number]    DOB: [birth year]   Date:08/23/2017       Progress Note  Subjective  Chief Complaint  Chief Complaint  Patient presents with  . Fatigue    no appetite, weak, tired, chills     HPI  Patient states has been having headaches on and off for the past 2 weeks- resolved with ibuprofen; hasn't had a headache in the last week. States noted severe fatigue and decreased appettite noted on Monday. Patient noted chills starting on Monday- checked temperature at home has been 102-103 drank cold water. Also of note has had diarrhea since Monday- initially denied. States sometimes when he is walking he is feeling off balance. Has had some muscle cramps last week- not any this week.   Increased hours at work- thought was related to fatigue.  Wt Readings from Last 3 Encounters:  08/23/17 195 lb 4.8 oz (88.6 kg)  08/11/17 216 lb 4.8 oz (98.1 kg)  07/03/17 219 lb (99.3 kg)   Denies rash, coughing, nausea, vomiting, constipation, abdominal pain, dysuria. 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 Sts went to work yesterday for 30 minutes and went home and sat in the chair and didn't get up until appointment today.   Similar event happened before three years ago and had all sorts of blood tests done  Patient Active Problem List   Diagnosis Date Noted  . Elevated AST (SGOT) 07/14/2015  . Prostate cancer screening 07/09/2015  . Medication monitoring encounter 07/09/2015  . Need for hepatitis C screening test 07/09/2015  . Tinea 08/11/2014  . 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 Disease of hair and hair follicles 56/21/3086  . Benign essential HTN 01/07/2009  . Fatigue 01/18/2008  . Decreased libido 02/01/2007  . CD (contact dermatitis) 10/10/2006    Past Medical History:  Diagnosis Date  . Decreased libido 02/01/2007  . Hypertension     Past Surgical History:  Procedure Laterality Date  . LUMBAR LAMINECTOMY      Social  History   Tobacco Use  . Smoking status: Current Every Day Smoker    Packs/day: 0.25    Types: Cigarettes  . Smokeless tobacco: Never Used  Substance Use Topics  . Alcohol use: No    Alcohol/week: 0.0 oz     Current Outpatient Medications:  .  aspirin EC 81 MG tablet, Take 1 tablet (81 mg total) by mouth daily., Disp: 30 tablet, Rfl: 11 .  chlorthalidone (HYGROTON) 25 MG tablet, Take 0.5 tablets (12.5 mg total) by mouth daily., Disp: 30 tablet, Rfl: 0 .  triamcinolone ointment (KENALOG) 0.1 %, Apply topically 2 (two) times daily as needed for rash., Disp: , Rfl:  .  terbinafine (LAMISIL) 250 MG tablet, Take 1 tablet (250 mg total) by mouth daily. (Patient not taking: Reported on 08/11/2017), Disp: 14 tablet, Rfl: 0  No Known Allergies  ROS   Constitutional: Positive for fever or weight change.  Respiratory: Negative for cough and shortness of breath.   Cardiovascular: Negative for chest pain or palpitations.  Gastrointestinal: Negative for abdominal pain, Has had bowel changes.  Musculoskeletal: Positive for gait problem Negative  joint swelling.  Skin: Negative for rash.  Neurological: Positive for dizziness or headache.  No other specific complaints in a complete review of systems (except as listed in HPI above).  Objective  Vitals:   08/23/17 0909  BP: 130/84  Pulse: 73  Resp: 18  Temp: (!)  101.5 F (38.6 C)  TempSrc: Oral  SpO2: 94%  Weight: 195 lb 4.8 oz (88.6 kg)  Height: _0  (1.956 m)     Body mass index is 23.16 kg/m.  Nursing Note and Vital Signs reviewed.  Physical Exam   Constitutional: Patient appears ill and uncomfortable, fatigued, unable to sit up during exam for more than a few seconds.  HEENT: head atraumatic, normocephalic, pupils equal and reactive to light, TM's without erythema or bulging,  no maxillary or frontal sinus tenderness , neck supple without lymphadenopathy, oropharynx pink and moist without exudate, no nasal discharge. No  temporal tenderness, no thyromegaly or thyroid nodules palpated Cardiovascular: Normal rate, regular rhythm, S1/S2 present.  No murmur or rub heard. Pulses intact.  Pulmonary/Chest: Effort normal and breath sounds clear. No respiratory distress or retractions. Abdominal: Soft and non-tender- throughout abdomen, bowel sounds hyperactive throughout, no hepatosplenomegaly noted Skin: warm and moist, chest rash resolving Psychiatric: Patient has a normal mood and affect. behavior is normal. Judgment and thought content normal.  No results found for this or any previous visit (from the past 72 hour(s)).  Assessment & Plan   Initially planned for outpatient work-up with close follow-up however patient lethargy of concern discussed plan for ER evaluation possible septic work-up and infectious disease consult as necessary, still plan for close outpatient follow up as needed. Pt has had 21 pound weight loss in less than 2 weeks, fever/chills, significant fatigue, decreased appetite quickly progressing since Monday. Pt was seen for rash earlier this month and derm biopsied-suggesting drug reaction vs pityriasis lichenoides vs photo dermatitis; low potential contact for tick bites. Follow-up tomorrow if ER discharged.       1. Fever, unspecified fever cause - CBC w/Diff/Platelet - COMPLETE METABOLIC PANEL WITH GFR - Urinalysis - Rocky mtn spotted fvr abs pnl(IgG+IgM) - B. burgdorfi antibodies - Sed Rate (ESR) - C-reactive protein  2. Weight loss - COMPLETE METABOLIC PANEL WITH GFR - Magnesium - TSH - Rocky mtn spotted fvr abs pnl(IgG+IgM) - B. burgdorfi antibodies - Sed Rate (ESR) - C-reactive protein  3. Other fatigue - COMPLETE METABOLIC PANEL WITH GFR - Magnesium - TSH - Urinalysis - Rocky mtn spotted fvr abs pnl(IgG+IgM) - B. burgdorfi antibodies - Sed Rate (ESR) - C-reactive protein  4. Diarrhea, unspecified type - Stool Culture

## 2017-08-23 NOTE — Telephone Encounter (Signed)
Patient was advised to have BP checked by nurse at work or go to pharmacy and keep log if he cannot come in. Patient stated he will go by pharmacy and call with results

## 2017-08-24 ENCOUNTER — Encounter: Payer: Self-pay | Admitting: Nurse Practitioner

## 2017-08-24 DIAGNOSIS — I700 Atherosclerosis of aorta: Secondary | ICD-10-CM | POA: Insufficient documentation

## 2017-08-24 DIAGNOSIS — E43 Unspecified severe protein-calorie malnutrition: Secondary | ICD-10-CM

## 2017-08-24 DIAGNOSIS — R634 Abnormal weight loss: Secondary | ICD-10-CM | POA: Diagnosis not present

## 2017-08-24 DIAGNOSIS — A419 Sepsis, unspecified organism: Secondary | ICD-10-CM | POA: Diagnosis not present

## 2017-08-24 DIAGNOSIS — J189 Pneumonia, unspecified organism: Secondary | ICD-10-CM | POA: Diagnosis not present

## 2017-08-24 DIAGNOSIS — E876 Hypokalemia: Secondary | ICD-10-CM | POA: Diagnosis not present

## 2017-08-24 LAB — BASIC METABOLIC PANEL
ANION GAP: 11 (ref 5–15)
BUN: 13 mg/dL (ref 8–23)
CALCIUM: 8.6 mg/dL — AB (ref 8.9–10.3)
CO2: 17 mmol/L — AB (ref 22–32)
Chloride: 104 mmol/L (ref 98–111)
Creatinine, Ser: 1.13 mg/dL (ref 0.61–1.24)
GFR calc Af Amer: >60 mL/min (ref >60)
GFR calc non Af Amer: >60 mL/min (ref >60)
Glucose, Bld: 115 mg/dL — ABNORMAL HIGH (ref 70–99)
POTASSIUM: 2.9 mmol/L — AB (ref 3.5–5.1)
Sodium: 132 mmol/L — ABNORMAL LOW (ref 135–145)

## 2017-08-24 LAB — CBC
HEMATOCRIT: 42 % (ref 40.0–52.0)
HEMOGLOBIN: 14.9 g/dL (ref 13.0–18.0)
MCH: 33.3 pg (ref 26.0–34.0)
MCHC: 35.4 g/dL (ref 32.0–36.0)
MCV: 93.9 fL (ref 80.0–100.0)
Platelets: 226 10*3/uL (ref 150–440)
RBC: 4.48 MIL/uL (ref 4.40–5.90)
RDW: 13.8 % (ref 11.5–14.5)
WBC: 7.3 10*3/uL (ref 3.8–10.6)

## 2017-08-24 LAB — VITAMIN B12: VITAMIN B 12: 182 pg/mL (ref 180–914)

## 2017-08-24 MED ORDER — MAGNESIUM SULFATE 2 GM/50ML IV SOLN
2.0000 g | Freq: Once | INTRAVENOUS | Status: AC
  Administered 2017-08-24: 11:00:00 2 g via INTRAVENOUS
  Filled 2017-08-24: qty 50

## 2017-08-24 MED ORDER — PIPERACILLIN-TAZOBACTAM 3.375 G IVPB
3.3750 g | Freq: Three times a day (TID) | INTRAVENOUS | Status: DC
  Administered 2017-08-25 – 2017-08-26 (×4): 3.375 g via INTRAVENOUS
  Filled 2017-08-24 (×4): qty 50

## 2017-08-24 MED ORDER — TRIAMCINOLONE ACETONIDE 0.1 % EX CREA
TOPICAL_CREAM | Freq: Two times a day (BID) | CUTANEOUS | Status: DC
  Administered 2017-08-24 – 2017-08-26 (×2): via TOPICAL
  Filled 2017-08-24 (×2): qty 15

## 2017-08-24 MED ORDER — POTASSIUM CHLORIDE CRYS ER 20 MEQ PO TBCR
40.0000 meq | EXTENDED_RELEASE_TABLET | Freq: Two times a day (BID) | ORAL | Status: DC
  Administered 2017-08-24 (×2): 40 meq via ORAL
  Filled 2017-08-24 (×3): qty 2

## 2017-08-24 MED ORDER — ENSURE ENLIVE PO LIQD
237.0000 mL | Freq: Three times a day (TID) | ORAL | Status: DC
  Administered 2017-08-25 – 2017-08-26 (×2): 237 mL via ORAL

## 2017-08-24 MED ORDER — POTASSIUM CHLORIDE IN NACL 20-0.9 MEQ/L-% IV SOLN
INTRAVENOUS | Status: DC
  Administered 2017-08-24 – 2017-08-25 (×2): via INTRAVENOUS
  Filled 2017-08-24 (×4): qty 1000

## 2017-08-24 MED ORDER — ADULT MULTIVITAMIN W/MINERALS CH
1.0000 | ORAL_TABLET | Freq: Every day | ORAL | Status: DC
  Administered 2017-08-25 – 2017-08-26 (×2): 1 via ORAL
  Filled 2017-08-24 (×2): qty 1

## 2017-08-24 MED ORDER — CYANOCOBALAMIN 1000 MCG/ML IJ SOLN
1000.0000 ug | Freq: Every day | INTRAMUSCULAR | Status: DC
  Administered 2017-08-24 – 2017-08-26 (×3): 1000 ug via INTRAMUSCULAR
  Filled 2017-08-24 (×3): qty 1

## 2017-08-24 NOTE — Progress Notes (Signed)
Patient ID: Najeh Credit, male   DOB: [birth year], 62 y.o.   MRN: [id number]  [facility] PROGRESS NOTE  Szymon Foiles BDZ:[id number] DOB: [birth year] DOA: 08/23/2017 PCP: Steele Sizer, MD  HPI/Subjective: Patient feeling okay.  Wanted to go home.  Had fever and shaking chills.  No cough or shortness of breath.  Having weight loss and diarrhea.  Objective: Vitals:   08/24/17 0854 08/24/17 1127  BP: 116/75 120/77  Pulse: 87   Resp: 16   Temp: 98.2 F (36.8 C)   SpO2: 97%     Filed Weights   08/23/17 1017 08/24/17 0425  Weight: 88.5 kg (195 lb) 90 kg (198 lb 6.6 oz)    ROS: Review of Systems  Constitutional: Positive for fever. Negative for chills.  Eyes: Negative for blurred vision.  Respiratory: Negative for cough and shortness of breath.   Cardiovascular: Negative for chest pain.  Gastrointestinal: Positive for diarrhea. Negative for abdominal pain, constipation, nausea and vomiting.  Genitourinary: Negative for dysuria.  Musculoskeletal: Negative for joint pain.  Skin: Positive for rash.  Neurological: Negative for dizziness and headaches.   Exam: Physical Exam  Constitutional: He is oriented to person, place, and time.  HENT:  Nose: No mucosal edema.  Mouth/Throat: No oropharyngeal exudate or posterior oropharyngeal edema.  Eyes: Pupils are equal, round, and reactive to light. Conjunctivae, EOM and lids are normal.  Neck: No JVD present. Carotid bruit is not present. No edema present. No thyroid mass and no thyromegaly present.  Cardiovascular: S1 normal and S2 normal. Exam reveals no gallop.  No murmur heard. Pulses:      Dorsalis pedis pulses are 2+ on the right side, and 2+ on the left side.  Respiratory: No respiratory distress. He has decreased breath sounds in the right lower field and the left lower field. He has no wheezes. He has rhonchi in the right lower field and the left lower field. He has no rales.  GI: Soft. Bowel sounds are normal. There is no  tenderness.  Musculoskeletal:       Right ankle: He exhibits no swelling.       Left ankle: He exhibits no swelling.  Lymphadenopathy:    He has no cervical adenopathy.  Neurological: He is alert and oriented to person, place, and time. No cranial nerve deficit.  Skin: Skin is warm. No rash noted. Nails show no clubbing.  Psychiatric: He has a normal mood and affect.      Data Reviewed: Basic Metabolic Panel: Recent Labs  Lab 08/23/17 1125 08/23/17 1208 08/24/17 0642  NA 132*  --  132*  K 2.8*  --  2.9*  CL 96*  --  104  CO2 23  --  17*  GLUCOSE 136*  --  115*  BUN 16  --  13  CREATININE 1.52*  --  1.13  CALCIUM 8.6*  --  8.6*  MG  --  1.8  --    CBC: Recent Labs  Lab 08/23/17 1031 08/24/17 0642  WBC 13.6* 7.3  HGB 16.6 14.9  HCT 47.1 42.0  MCV 94.2 93.9  PLT 254 226   Cardiac Enzymes: Recent Labs  Lab 08/23/17 1132  TROPONINI <0.03    Recent Results (from the past 240 hour(s))  Blood culture (routine x 2)     Status: None (Preliminary result)   Collection Time: 08/23/17 12:08 PM  Result Value Ref Range Status   Specimen Description BLOOD BLOOD RIGHT ARM  Final   Special Requests  Final    BOTTLES DRAWN AEROBIC AND ANAEROBIC Blood Culture adequate volume   Culture   Final    NO GROWTH < 24 HOURS Performed at [facility], [address] [place], [place] [address]    Report Status PENDING  Incomplete  Blood culture (routine x 2)     Status: None (Preliminary result)   Collection Time: 2017-09-12 12:08 PM  Result Value Ref Range Status   Specimen Description BLOOD LEFT ANTECUBITAL  Final   Special Requests   Final    BOTTLES DRAWN AEROBIC AND ANAEROBIC Blood Culture adequate volume   Culture   Final    NO GROWTH < 24 HOURS Performed at [facility], [address], [place], [place] [address]    Report Status PENDING  Incomplete     Studies: Dg Chest 2 View  Result Date: 12-Sep-2017 CLINICAL DATA:  21 pound weight loss over  the past several weeks. Fever and fatigue for the past several days. Onset of diarrhea yesterday. Current smoker. History of hypertension EXAM: CHEST - 2 VIEW COMPARISON:  None in PACs FINDINGS: The lungs are adequately inflated. There is patchy increased density at the left lung base likely posteriorly. The heart and pulmonary vascularity are normal. The mediastinum is normal in width. The bony thorax exhibits no acute abnormality. IMPRESSION: Patchy airspace opacity in the left lower lobe. This may reflect pneumonia. Followup PA and lateral chest X-ray is recommended in 3-4 weeks following trial of antibiotic therapy to ensure resolution and exclude underlying malignancy. Electronically Signed   By: David  Martinique M.D.   On: 09/12/2017 12:42   Ct Chest W Contrast  Result Date: 2017-09-12 CLINICAL DATA:  Pronounced weight loss over the last several weeks. Fever. EXAM: CT CHEST, ABDOMEN, AND PELVIS WITH CONTRAST TECHNIQUE: Multidetector CT imaging of the chest, abdomen and pelvis was performed following the standard protocol during bolus administration of intravenous contrast. CONTRAST:  197mL ISOVUE-300 IOPAMIDOL (ISOVUE-300) INJECTION 61% COMPARISON:  None. FINDINGS: CT CHEST FINDINGS Cardiovascular: Heart size is normal. Fleck like coronary artery calcification is seen, without advanced finding. No pericardial fluid. No thoracic aortic atherosclerosis. Pulmonary arteries as seen appear normal. Mediastinum/Nodes: No mediastinal or hilar mass or lymphadenopathy. Lungs/Pleura: There is bilateral bronchopneumonia, most pronounced in the posterior left lower lobe, but with small areas of patchy involvement also in the right lower lobe. No dense consolidation or lobar collapse. No pleural effusion. Musculoskeletal: Negative CT ABDOMEN PELVIS FINDINGS Hepatobiliary: Liver parenchyma is normal. Insignificant subcentimeter cyst at the caudal tip of the right lobe. No calcified gallstones. Pancreas: Normal Spleen:  Normal Adrenals/Urinary Tract: Adrenal glands are normal. Kidneys are normal. No cyst, mass, stone or hydronephrosis. Bladder is normal. Stomach/Bowel: Liquid stool is noted. There is no evidence of obstruction or focal lesion however. The appendix is normal. Vascular/Lymphatic: Aortic atherosclerosis. No aneurysm. IVC is normal. No retroperitoneal adenopathy. Reproductive: Normal Other: No free fluid or air. Musculoskeletal: Ordinary lower lumbar degenerative changes. IMPRESSION: Left lower lobe pneumonia. Minimal, patchy involvement also in the right lower lobe. No dense consolidation or lobar collapse. No pleural effusion. Liquid stool, consistent with the clinical history of diarrhea. No specific abdominal finding of acute significance. Aortic atherosclerosis. Electronically Signed   By: Nelson Chimes M.D.   On: 09-12-2017 14:28   Ct Abdomen Pelvis W Contrast  Result Date: September 12, 2017 CLINICAL DATA:  Pronounced weight loss over the last several weeks. Fever. EXAM: CT CHEST, ABDOMEN, AND PELVIS WITH CONTRAST TECHNIQUE: Multidetector CT imaging of the chest,  abdomen and pelvis was performed following the standard protocol during bolus administration of intravenous contrast. CONTRAST:  154mL ISOVUE-300 IOPAMIDOL (ISOVUE-300) INJECTION 61% COMPARISON:  None. FINDINGS: CT CHEST FINDINGS Cardiovascular: Heart size is normal. Fleck like coronary artery calcification is seen, without advanced finding. No pericardial fluid. No thoracic aortic atherosclerosis. Pulmonary arteries as seen appear normal. Mediastinum/Nodes: No mediastinal or hilar mass or lymphadenopathy. Lungs/Pleura: There is bilateral bronchopneumonia, most pronounced in the posterior left lower lobe, but with small areas of patchy involvement also in the right lower lobe. No dense consolidation or lobar collapse. No pleural effusion. Musculoskeletal: Negative CT ABDOMEN PELVIS FINDINGS Hepatobiliary: Liver parenchyma is normal. Insignificant  subcentimeter cyst at the caudal tip of the right lobe. No calcified gallstones. Pancreas: Normal Spleen: Normal Adrenals/Urinary Tract: Adrenal glands are normal. Kidneys are normal. No cyst, mass, stone or hydronephrosis. Bladder is normal. Stomach/Bowel: Liquid stool is noted. There is no evidence of obstruction or focal lesion however. The appendix is normal. Vascular/Lymphatic: Aortic atherosclerosis. No aneurysm. IVC is normal. No retroperitoneal adenopathy. Reproductive: Normal Other: No free fluid or air. Musculoskeletal: Ordinary lower lumbar degenerative changes. IMPRESSION: Left lower lobe pneumonia. Minimal, patchy involvement also in the right lower lobe. No dense consolidation or lobar collapse. No pleural effusion. Liquid stool, consistent with the clinical history of diarrhea. No specific abdominal finding of acute significance. Aortic atherosclerosis. Electronically Signed   By: Nelson Chimes M.D.   On: 08/23/2017 14:28    Scheduled Meds: . cyanocobalamin  1,000 mcg Intramuscular Daily  . enoxaparin (LOVENOX) injection  40 mg Subcutaneous Q24H  . potassium chloride  40 mEq Oral BID  . sodium chloride flush  3 mL Intravenous Q12H  . triamcinolone cream   Topical BID   Continuous Infusions: . 0.9 % NaCl with KCl 20 mEq / L 15 mL/hr at 08/24/17 1405  . levofloxacin (LEVAQUIN) IV      Assessment/Plan:  1. Clinical sepsis on admission with bilateral pneumonia on IV Levaquin.  So far blood cultures are negative. 2. Hypokalemia replace potassium orally and check again tomorrow morning.  Replace magnesium. 3. Weight loss.  CT scan of the abdomen pelvis was negative for tumor.  TSH normal range. 4. Rash.  Could be secondary to hydrochlorothiazide or hygroton.  Triamcinolone cream. 5. Diarrhea.  Stool studies if has any further diarrhea 6. Dehydration and acute kidney injury IV fluid hydration. 7. B12 deficiency on IM B12 injections while here. 8. History of hypertension blood pressure  on the lower side.  Check orthostatics and hold antihypertensive medications.  Code Status:     Code Status Orders  (From admission, onward)        Start     Ordered   08/23/17 1449  Full code  Continuous     08/23/17 1450    Code Status History    This patient has a current code status but no historical code status.     Family Communication: Permission to speak in front friend at the bedside.  Wife also at the bedside. Disposition Plan: Potentially home tomorrow if doing better  Antibiotics:  Levaquin  Time spent: 28 minutes  [place]

## 2017-08-24 NOTE — Plan of Care (Signed)

## 2017-08-24 NOTE — Progress Notes (Signed)
Initial Nutrition Assessment  DOCUMENTATION CODES:   Severe malnutrition in context of acute illness/injury  INTERVENTION:  Agree with regular diet.  Provide Ensure Enlive po TID, each supplement provides 350 kcal and 20 grams of protein.  Provide daily MVI.  Encouraged adequate intake of calories and protein from meals, snacks, and oral nutrition supplements to help prevent any further unintentional weight loss or loss of lean body mass.  NUTRITION DIAGNOSIS:   Severe Malnutrition related to acute illness(unknown etiology) as evidenced by moderate fat depletion, mild muscle depletion, moderate muscle depletion, 8.3 percent weight loss over 2 weeks.  GOAL:   Patient will meet greater than or equal to 90% of their needs  MONITOR:   PO intake, Supplement acceptance, Labs, Weight trends, I & O's  REASON FOR ASSESSMENT:   Malnutrition Screening Tool    ASSESSMENT:   62 year old male with PMHx of HTN admitted with sepsis, bilateral PNA, hypokalemia, weight loss, diarrhea, vitamin B12 deficiency.   Met with patient at bedside. He seems to have a flat affect. He reports he has been feeling weak. He reports he is unsure why he has lost so much weight the past few weeks. He had a similar episode about 3 years ago where he lost his appetite for a while and lost weight, but he reports it was not nearly this much weight loss. He provides an unreliable history. He initially reports he has had a decreased appetite. He then reported that her appetite and intake have not been decreased over the past 2 weeks. He reports eating 2 meals per day. He initially reported eating a small lunch of mainly vegetables. For dinner he has squash, broccoli, mushrooms, onions, and peppers. RD asked patient if he ever has any sources of protein as those meals only contained vegetables and he initially reported no. He then later reported that he has chicken or pork when his family wants to eat that. RD asked how  often that is, and he reported every day. Very unclear on what patient's typical intake is as he kept contradicting himself. He reports he is not eating well here because he does not like the food. Meal completion not yet recorded in chart. He is amenable to drinking oral nutrition supplement to help meet calorie and protein needs. He denies any food allergies or intolerances. Patient reports he had diarrhea overnight but it is better today. He reports last night was the only night he had diarrhea.  UBW was around 216 lbs. Per chart patient was 216.3 lbs on 08/11/2017. He reports this weight was taken at an outpatient appointment. He is now 198.4 lbs, so he has lost 17.9 lbs (8.3% body weight) over the past 2 weeks, which is significant for time frame.  Medications reviewed and include: vitamin B12 1000 micrograms daily IM, potassium chloride 40 mEq BID, NS with KCl 20 mEq/L at 15 mL/hr, Levaquin.  Labs reviewed: Sodium 132, Potassium 2.9, CO2 17, vitamin B12 182.  NUTRITION - FOCUSED PHYSICAL EXAM:    Most Recent Value  Orbital Region  Moderate depletion  Upper Arm Region  Moderate depletion  Thoracic and Lumbar Region  Mild depletion  Buccal Region  Moderate depletion  Temple Region  Moderate depletion  Clavicle Bone Region  Moderate depletion  Clavicle and Acromion Bone Region  Mild depletion  Scapular Bone Region  Mild depletion  Dorsal Hand  Mild depletion  Patellar Region  Moderate depletion  Anterior Thigh Region  Moderate depletion  Posterior Calf Region  Moderate depletion  Edema (RD Assessment)  None  Hair  Reviewed  Eyes  Reviewed  Mouth  Reviewed  Skin  Reviewed  Nails  Reviewed     Diet Order:   Diet Order           Diet regular Room service appropriate? Yes; Fluid consistency: Thin  Diet effective now          EDUCATION NEEDS:   Education needs have been addressed  Skin:  Skin Assessment: Reviewed RN Assessment  Last BM:  08/23/2017  Height:   Ht Readings  from Last 1 Encounters:  08/23/17 _0  (1.93 m)    Weight:   Wt Readings from Last 1 Encounters:  08/24/17 198 lb 6.6 oz (90 kg)    Ideal Body Weight:  91.8 kg  BMI:  Body mass index is 24.15 kg/m.  Estimated Nutritional Needs:   Kcal:  0277-4128 (MSJ x 1.2-1.4)  Protein:  110-125 grams (1.2-1.4 grams/kg)  Fluid:  2.2-2.5 L/day (1 mL/kcal)  Willey Blade, MS, RD, LDN Office: [phone] Pager: [phone] After Hours/Weekend Pager: [phone]

## 2017-08-24 NOTE — Consult Note (Signed)
Pharmacy Antibiotic Note  Kristopher Davis is a 62 y.o. male admitted on 08/23/2017 with pneumonia. 6/27 MD called 6/27 @ 2100 to report patient was having hallucinations. Decision was made to D/C levofloxacin. Pharmacy has been consulted for Zosyn Dosing.  Plan: Start Zosyn 3.375 IV EI every 8 hours.   Height: 6\' 4"  (193 cm) Weight: 198 lb 6.6 oz (90 kg) IBW/kg (Calculated) : 86.8  Temp (24hrs), Avg:99.4 F (37.4 C), Min:97.5 F (36.4 C), Max:101.1 F (38.4 C)  Recent Labs  Lab 08/23/17 1031 08/23/17 1125 08/23/17 1208 08/24/17 0642  WBC 13.6*  --   --  7.3  CREATININE  --  1.52*  --  1.13  LATICACIDVEN  --   --  1.9  --     Estimated Creatinine Clearance: 84.3 mL/min (by C-G formula based on SCr of 1.13 mg/dL).    Allergies  Allergen Reactions  . Levofloxacin In D5w     hallucinations    Antimicrobials this admission: 6/26 Levofloxacin >> 6/27 Zosyn  >>   Thank you for allowing pharmacy to be a part of this patient's care.  Pernell Dupre, PharmD, BCPS Clinical Pharmacist 08/24/2017 9:15 PM

## 2017-08-24 NOTE — Evaluation (Signed)
Physical Therapy Evaluation Patient Details Name: Kristopher Davis MRN: [id number] DOB: 02/07/1956 Today's Date: 08/24/2017   History of Present Illness  Patient is a 62 year old male admitted for weakness, wt loss and pneumonia following a report of loss of appetite, weakness and a near fall in his home.  PMH includes htn.  Clinical Impression  Pt is a 62 year old male who lives on a two story home with his wife.  He is independent at baseline without use of AD.  Pt is independent with bed mobility, STS transfers and is able to walk without AD.  He presented with good overall strength of UE and LE bilaterally, reporting no N/T in feet.  When standing from bedside, pt experienced a lateral deviation to L side which he was able to self correct.  Pt also demonstrated lateral deviations during gait in room, as well as bumping into trash can as he passed, also self corrected.  Pt denies dizziness or difficulty with vision.  He was able to perform standing functional activities requiring balance without use of UE.  PT assessed BP which measured WNL.  Pt's wife also states that pt has not been able to eat much since becoming ill.  PT discussed possible use of SPC and OP PT if balance deficits do not resolve and pt expressed understanding.  Pt will benefit from skilled PT with focus on balance and fall prevention and proper use of SPC.      Follow Up Recommendations Outpatient PT(Pt may need OP PT if balance deficits do not resolve during hospital stay.)    Equipment Recommendations  (TBD next treatment.)    Recommendations for Other Services       Precautions / Restrictions Precautions Precautions: Fall Precaution Comments: Moderate Fall Risk Restrictions Weight Bearing Restrictions: No      Mobility  Bed Mobility Overal bed mobility: Independent                Transfers Overall transfer level: Independent Equipment used: None             General transfer comment: Slight lateral  LOB to L side upon standing.  Pt denies and dizziness.  Ambulation/Gait Ambulation/Gait assistance: Supervision Gait Distance (Feet): 30 Feet       Gait velocity interpretation: 1.31 - 2.62 ft/sec, indicative of limited community ambulator General Gait Details: reciprocal gait pattern, good foot clearance, knee flexion.  Pt demonstrated mild lateral gait deviations, self corrected, when walking.  He also failed to clear the trash can when walking by, stating that he was able to see it.  Stairs            Wheelchair Mobility    Modified Rankin (Stroke Patients Only)       Balance Overall balance assessment: Independent;Mild deficits observed, not formally tested(Pt demonstrated ability to bed to pick up object and reach overhead to simulate taking object out of cabinet without UE assist.)                                           Pertinent Vitals/Pain Pain Assessment: No/denies pain    Home Living Family/patient expects to be discharged to:: Private residence Living Arrangements: Spouse/significant other Available Help at Discharge: Family;Available PRN/intermittently Type of Home: House Home Access: Stairs to enter Entrance Stairs-Rails: Can reach both Entrance Stairs-Number of Steps: 2 Home Layout: Two level Home Equipment:  None      Prior Function Level of Independence: Independent         Comments: Pt is generally active and able to drive.     Hand Dominance        Extremity/Trunk Assessment   Upper Extremity Assessment Upper Extremity Assessment: Overall WFL for tasks assessed(Grossly 5/5 bilaterally)    Lower Extremity Assessment Lower Extremity Assessment: Overall WFL for tasks assessed(Grossly 5/5 bilaterally.)    Cervical / Trunk Assessment Cervical / Trunk Assessment: Normal  Communication   Communication: HOH  Cognition Arousal/Alertness: Lethargic Behavior During Therapy: Restless Overall Cognitive Status: Within  Functional Limits for tasks assessed                                 General Comments: A&O to self and situation.  Follows commands consistently.      General Comments      Exercises     Assessment/Plan    PT Assessment Patient needs continued PT services  PT Problem List Decreased balance;Decreased knowledge of use of DME       PT Treatment Interventions DME instruction;Therapeutic activities;Gait training;Stair training;Balance training;Functional mobility training;Neuromuscular re-education;Therapeutic exercise;Patient/family education    PT Goals (Current goals can be found in the Care Plan section)  Acute Rehab PT Goals Patient Stated Goal: To return home and back to active lifestyle. PT Goal Formulation: With patient Time For Goal Achievement: 09/07/17 Potential to Achieve Goals: Good    Frequency Min 2X/week   Barriers to discharge        Co-evaluation               AM-PAC PT "6 Clicks" Daily Activity  Outcome Measure Difficulty turning over in bed (including adjusting bedclothes, sheets and blankets)?: None Difficulty moving from lying on back to sitting on the side of the bed? : None Difficulty sitting down on and standing up from a chair with arms (e.g., wheelchair, bedside commode, etc,.)?: None Help needed moving to and from a bed to chair (including a wheelchair)?: None Help needed walking in hospital room?: None Help needed climbing 3-5 steps with a railing? : None 6 Click Score: 24    End of Session Equipment Utilized During Treatment: Gait belt Activity Tolerance: Patient tolerated treatment well Patient left: in bed;with family/visitor present;with nursing/sitter in room;with call bell/phone within reach   PT Visit Diagnosis: Unsteadiness on feet (R26.81)    Time: 2500-3704 PT Time Calculation (min) (ACUTE ONLY): 25 min   Charges:   PT Evaluation $PT Eval Low Complexity: 1 Low     PT G Codes:   PT G-Codes **NOT FOR  INPATIENT CLASS** Functional Assessment Tool Used: AM-PAC 6 Clicks Basic Mobility    Roxanne Gates, PT, DPT  Roxanne Gates 08/24/2017, 11:39 AM

## 2017-08-25 ENCOUNTER — Inpatient Hospital Stay: Payer: BLUE CROSS/BLUE SHIELD

## 2017-08-25 DIAGNOSIS — A419 Sepsis, unspecified organism: Secondary | ICD-10-CM | POA: Diagnosis not present

## 2017-08-25 DIAGNOSIS — E876 Hypokalemia: Secondary | ICD-10-CM | POA: Diagnosis not present

## 2017-08-25 DIAGNOSIS — J189 Pneumonia, unspecified organism: Secondary | ICD-10-CM | POA: Diagnosis not present

## 2017-08-25 DIAGNOSIS — R410 Disorientation, unspecified: Secondary | ICD-10-CM | POA: Diagnosis not present

## 2017-08-25 DIAGNOSIS — R443 Hallucinations, unspecified: Secondary | ICD-10-CM | POA: Diagnosis not present

## 2017-08-25 LAB — BASIC METABOLIC PANEL
ANION GAP: 11 (ref 5–15)
BUN: 13 mg/dL (ref 8–23)
CALCIUM: 8.3 mg/dL — AB (ref 8.9–10.3)
CO2: 18 mmol/L — ABNORMAL LOW (ref 22–32)
CREATININE: 0.99 mg/dL (ref 0.61–1.24)
Chloride: 105 mmol/L (ref 98–111)
GFR calc non Af Amer: >60 mL/min (ref >60)
Glucose, Bld: 102 mg/dL — ABNORMAL HIGH (ref 70–99)
Potassium: 3.3 mmol/L — ABNORMAL LOW (ref 3.5–5.1)
SODIUM: 134 mmol/L — AB (ref 135–145)

## 2017-08-25 LAB — GASTROINTESTINAL PANEL BY PCR, STOOL (REPLACES STOOL CULTURE)

## 2017-08-25 LAB — C DIFFICILE QUICK SCREEN W PCR REFLEX
C DIFFICILE (CDIFF) INTERP: NOT DETECTED
C Diff antigen: NEGATIVE
C Diff toxin: NEGATIVE

## 2017-08-25 LAB — MAGNESIUM: MAGNESIUM: 2.2 mg/dL (ref 1.7–2.4)

## 2017-08-25 MED ORDER — AZITHROMYCIN 500 MG PO TABS
500.0000 mg | ORAL_TABLET | Freq: Every day | ORAL | Status: AC
Start: 2017-08-25 — End: 2017-08-25
  Administered 2017-08-25: 18:00:00 500 mg via ORAL
  Filled 2017-08-25: qty 1

## 2017-08-25 MED ORDER — TRAZODONE HCL 50 MG PO TABS: 50.0000 mg | ORAL_TABLET | Freq: Every evening | ORAL | Status: DC | PRN

## 2017-08-25 MED ORDER — ASPIRIN EC 81 MG PO TBEC
81.0000 mg | DELAYED_RELEASE_TABLET | Freq: Every day | ORAL | Status: DC
  Administered 2017-08-25 – 2017-08-26 (×2): 81 mg via ORAL
  Filled 2017-08-25 (×2): qty 1

## 2017-08-25 MED ORDER — AZITHROMYCIN 500 MG PO TABS: 250.0000 mg | ORAL_TABLET | Freq: Every day | ORAL | Status: DC

## 2017-08-25 MED ORDER — POTASSIUM CHLORIDE CRYS ER 20 MEQ PO TBCR
20.0000 meq | EXTENDED_RELEASE_TABLET | Freq: Every day | ORAL | Status: DC
  Administered 2017-08-25 – 2017-08-26 (×2): 20 meq via ORAL
  Filled 2017-08-25: qty 1

## 2017-08-25 MED ORDER — HALOPERIDOL LACTATE 5 MG/ML IJ SOLN: 1.0000 mg | Freq: Four times a day (QID) | INTRAMUSCULAR | Status: DC | PRN

## 2017-08-25 MED ORDER — CHLORTHALIDONE 25 MG PO TABS
12.5000 mg | ORAL_TABLET | Freq: Every day | ORAL | Status: DC
  Filled 2017-08-25: qty 0.5

## 2017-08-25 NOTE — Progress Notes (Signed)
Patient ID: Kristopher Davis, male   DOB: 30-Sep-1955, 62 y.o.   MRN: [id number]  [facility] PROGRESS NOTE  Kristopher Davis WGN:[id number] DOB: 12-24-1955 DOA: 08/23/2017 PCP: Kristopher Sizer, MD  HPI/Subjective: Patient feeling okay now.  Had some visual hallucinations with seeing the TV on the floor and things moved around in the room.  Patient states his breathing is okay.  Had some diarrhea.  Objective: Vitals:   08/25/17 0621 08/25/17 1436  BP: 123/76 110/78  Pulse: 67 68  Resp: 20   Temp: 98.8 F (37.1 C) 98.5 F (36.9 C)  SpO2: 99% 100%    Filed Weights   08/23/17 1017 08/24/17 0425 08/25/17 0500  Weight: 88.5 kg (195 lb) 90 kg (198 lb 6.6 oz) 89.9 kg (198 lb 3.2 oz)    ROS: Review of Systems  Constitutional: Positive for fever. Negative for chills.  Eyes: Negative for blurred vision.  Respiratory: Negative for cough and shortness of breath.   Cardiovascular: Negative for chest pain.  Gastrointestinal: Positive for diarrhea. Negative for abdominal pain, constipation, nausea and vomiting.  Genitourinary: Negative for dysuria.  Musculoskeletal: Negative for joint pain.  Skin: Positive for rash.  Neurological: Negative for dizziness and headaches.   Exam: Physical Exam  Constitutional: He is oriented to person, place, and time.  HENT:  Nose: No mucosal edema.  Mouth/Throat: No oropharyngeal exudate or posterior oropharyngeal edema.  Eyes: Pupils are equal, round, and reactive to light. Conjunctivae, EOM and lids are normal.  Neck: No JVD present. Carotid bruit is not present. No edema present. No thyroid mass and no thyromegaly present.  Cardiovascular: S1 normal and S2 normal. Exam reveals no gallop.  No murmur heard. Pulses:      Dorsalis pedis pulses are 2+ on the right side, and 2+ on the left side.  Respiratory: No respiratory distress. He has decreased breath sounds in the right lower field and the left lower field. He has no wheezes. He has rhonchi in the right  lower field and the left lower field. He has no rales.  GI: Soft. Bowel sounds are normal. There is no tenderness.  Musculoskeletal:       Right ankle: He exhibits no swelling.       Left ankle: He exhibits no swelling.  Lymphadenopathy:    He has no cervical adenopathy.  Neurological: He is alert and oriented to person, place, and time. No cranial nerve deficit.  Skin: Skin is warm. Nails show no clubbing.  Darkened skin rash on arms and abdomen.  Psychiatric: He has a normal mood and affect.      Data Reviewed: Basic Metabolic Panel: Recent Labs  Lab 08/23/17 1125 08/23/17 1208 08/24/17 0642 08/25/17 0446  NA 132*  --  132* 134*  K 2.8*  --  2.9* 3.3*  CL 96*  --  104 105  CO2 23  --  17* 18*  GLUCOSE 136*  --  115* 102*  BUN 16  --  13 13  CREATININE 1.52*  --  1.13 0.99  CALCIUM 8.6*  --  8.6* 8.3*  MG  --  1.8  --  2.2   CBC: Recent Labs  Lab 08/23/17 1031 08/24/17 0642  WBC 13.6* 7.3  HGB 16.6 14.9  HCT 47.1 42.0  MCV 94.2 93.9  PLT 254 226   Cardiac Enzymes: Recent Labs  Lab 08/23/17 1132  TROPONINI <0.03    Recent Results (from the past 240 hour(s))  Blood culture (routine x 2)  Status: None (Preliminary result)   Collection Time: 08/23/17 12:08 PM  Result Value Ref Range Status   Specimen Description BLOOD BLOOD RIGHT ARM  Final   Special Requests   Final    BOTTLES DRAWN AEROBIC AND ANAEROBIC Blood Culture adequate volume   Culture   Final    NO GROWTH 2 DAYS Performed at [facility], [address] [place], [place] [address]    Report Status PENDING  Incomplete  Blood culture (routine x 2)     Status: None (Preliminary result)   Collection Time: 08/23/17 12:08 PM  Result Value Ref Range Status   Specimen Description BLOOD LEFT ANTECUBITAL  Final   Special Requests   Final    BOTTLES DRAWN AEROBIC AND ANAEROBIC Blood Culture adequate volume   Culture   Final    NO GROWTH 2 DAYS Performed at [facility], [address], [place], [place] [address]    Report Status PENDING  Incomplete  Gastrointestinal Panel by PCR , Stool     Status: None   Collection Time: 08/24/17  9:26 AM  Result Value Ref Range Status   Campylobacter species NOT DETECTED NOT DETECTED Final   Plesimonas shigelloides NOT DETECTED NOT DETECTED Final   Salmonella species NOT DETECTED NOT DETECTED Final   Yersinia enterocolitica NOT DETECTED NOT DETECTED Final   Vibrio species NOT DETECTED NOT DETECTED Final   Vibrio cholerae NOT DETECTED NOT DETECTED Final   Enteroaggregative E coli (EAEC) NOT DETECTED NOT DETECTED Final   Enteropathogenic E coli (EPEC) NOT DETECTED NOT DETECTED Final   Enterotoxigenic E coli (ETEC) NOT DETECTED NOT DETECTED Final   Shiga like toxin producing E coli (STEC) NOT DETECTED NOT DETECTED Final   Shigella/Enteroinvasive E coli (EIEC) NOT DETECTED NOT DETECTED Final   Cryptosporidium NOT DETECTED NOT DETECTED Final   Cyclospora cayetanensis NOT DETECTED NOT DETECTED Final   Entamoeba histolytica NOT DETECTED NOT DETECTED Final   Giardia lamblia NOT DETECTED NOT DETECTED Final   Adenovirus F40/41 NOT DETECTED NOT DETECTED Final   Astrovirus NOT DETECTED NOT DETECTED Final   Norovirus GI/GII NOT DETECTED NOT DETECTED Final   Rotavirus A NOT DETECTED NOT DETECTED Final   Sapovirus (I, II, IV, and V) NOT DETECTED NOT DETECTED Final    Comment: Performed at [facility], [address] [place], [place] [address]  C difficile quick scan w PCR reflex     Status: None   Collection Time: 08/25/17  9:26 AM  Result Value Ref Range Status   C Diff antigen NEGATIVE NEGATIVE Final   C Diff toxin NEGATIVE NEGATIVE Final   C Diff interpretation No C. difficile detected.  Final    Comment: Performed at [facility], [address] [place], [place] [address]     Scheduled Meds: . aspirin EC  81 mg Oral Daily  . azithromycin  500 mg Oral Daily   Followed by  . [START ON 08/26/2017]  azithromycin  250 mg Oral Daily  . cyanocobalamin  1,000 mcg Intramuscular Daily  . enoxaparin (LOVENOX) injection  40 mg Subcutaneous Q24H  . feeding supplement (ENSURE ENLIVE)  237 mL Oral TID BM  . multivitamin with minerals  1 tablet Oral Daily  . potassium chloride  20 mEq Oral Daily  . sodium chloride flush  3 mL Intravenous Q12H  . triamcinolone cream   Topical BID   Continuous Infusions: . piperacillin-tazobactam (ZOSYN)  IV 3.375 g (08/25/17 1512)    Assessment/Plan:  1. Clinical sepsis on admission with bilateral pneumonia.  Patient was on IV Levaquin and switched over to Zosyn secondary to hallucinations.  Add Zithromax to cover atypicals.  So far blood cultures are negative. 2. Acute delirium with hallucinations.  PRN Haldol if needed and trazodone as needed for sleep.  CT scan of the head ordered because of balance issues. 3. Hypokalemia replace potassium orally.  Hygroton is not a good medication for this patient. 4. Weight loss.  CT scan of the abdomen pelvis was negative for tumor.  TSH normal range. 5. Rash.  Could be secondary to hydrochlorothiazide or hygroton.  Triamcinolone cream. 6. Diarrhea.  Stool studies negative 7. Dehydration and acute kidney injury. improved with IV fluids 8. B12 deficiency on IM B12 injections while here. 9. History of hypertension blood pressure on the lower side.  Code Status:     Code Status Orders  (From admission, onward)        Start     Ordered   08/23/17 1449  Full code  Continuous     08/23/17 1450    Code Status History    This patient has a current code status but no historical code status.     Family Communication:  wife at the bedside Disposition Plan: Potentially home tomorrow if doing better  Antibiotics:   Zosyn  Zithromax  Time spent: 27 minutes  [place]

## 2017-08-26 DIAGNOSIS — A419 Sepsis, unspecified organism: Secondary | ICD-10-CM | POA: Diagnosis not present

## 2017-08-26 DIAGNOSIS — R410 Disorientation, unspecified: Secondary | ICD-10-CM | POA: Diagnosis not present

## 2017-08-26 DIAGNOSIS — J189 Pneumonia, unspecified organism: Secondary | ICD-10-CM | POA: Diagnosis not present

## 2017-08-26 DIAGNOSIS — E876 Hypokalemia: Secondary | ICD-10-CM | POA: Diagnosis not present

## 2017-08-26 MED ORDER — AMOXICILLIN-POT CLAVULANATE 875-125 MG PO TABS: 1.0000 | ORAL_TABLET | Freq: Two times a day (BID) | ORAL | 0 refills | Status: AC

## 2017-08-26 MED ORDER — AZITHROMYCIN 250 MG PO TABS: 250.0000 mg | ORAL_TABLET | Freq: Every day | ORAL | 0 refills | Status: AC

## 2017-08-26 NOTE — Discharge Summary (Signed)
[facility] - [address] at [facility], 62 y.o., DOB 08-01-1955, MRN [id number]. Admission date: 2017-09-09 Discharge Date 08/26/2017 Primary MD [place] Sizer, MD Admitting Physician Hillary Bow, MD  Admission Diagnosis  Weakness [R53.1] Weight loss [R63.4] Community acquired pneumonia of left lower lobe of lung ([place]) [J18.1]  Discharge Diagnosis   Active Problems: Clinical sepsis due to bilateral pneumonia Acute delirium with hallucination Hypokalemia Weight loss CT scan negative for any pathology Rash Diarrhea B12 deficiency Essential hypertension with low blood pressure  Hospital Course  Kristopher Davis  is a 62 y.o. male with a known history of hypertension on chlorthalidone presents to the emergency room due to worsening fatigue and weight loss over the past 3 weeks.  Patient has noticed 21 pound weight loss.  Loss of appetite.  No shortness of breath or cough.  Patient was admitted for further evaluation underwent a CT of the chest and abdomen showed bilateral pneumonia therefore he was treated with antibiotics.  With these treatments his symptoms improved.  He is very anxious to go home.  And was very upset that I did not discharge him earlier.  He is feeling better and stable for discharge. He also was noted to have some dizziness therefore his blood pressure medications have been discontinued.             Consults  None  Significant Tests:  See full reports for all details    Dg Chest 2 View  Result Date: Sep 09, 2017 CLINICAL DATA:  21 pound weight loss over the past several weeks. Fever and fatigue for the past several days. Onset of diarrhea yesterday. Current smoker. History of hypertension EXAM: CHEST - 2 VIEW COMPARISON:  None in PACs FINDINGS: The lungs are adequately inflated. There is patchy increased density at the left lung base likely posteriorly. The heart and pulmonary vascularity are normal. The mediastinum is normal in width. The  bony thorax exhibits no acute abnormality. IMPRESSION: Patchy airspace opacity in the left lower lobe. This may reflect pneumonia. Followup PA and lateral chest X-ray is recommended in 3-4 weeks following trial of antibiotic therapy to ensure resolution and exclude underlying malignancy. Electronically Signed   By: David  Martinique M.D.   On: 09-Sep-2017 12:42   Ct Head Wo Contrast  Result Date: 08/25/2017 CLINICAL DATA:  Hallucinations. EXAM: CT HEAD WITHOUT CONTRAST TECHNIQUE: Contiguous axial images were obtained from the base of the skull through the vertex without intravenous contrast. COMPARISON:  None. FINDINGS: Brain: No evidence of acute infarction, hemorrhage, hydrocephalus, extra-axial collection or mass lesion/mass effect. Moderate brain parenchymal volume loss. Cavum septum pellucidum noted. Vascular: No hyperdense vessel or unexpected calcification. Skull: Normal. Negative for fracture or focal lesion. Sinuses/Orbits: No acute finding. Other: None. IMPRESSION: No acute intracranial abnormality. Moderate brain parenchymal atrophy. Electronically Signed   By: Fidela Salisbury M.D.   On: 08/25/2017 16:53   Ct Chest W Contrast  Result Date: 09-Sep-2017 CLINICAL DATA:  Pronounced weight loss over the last several weeks. Fever. EXAM: CT CHEST, ABDOMEN, AND PELVIS WITH CONTRAST TECHNIQUE: Multidetector CT imaging of the chest, abdomen and pelvis was performed following the standard protocol during bolus administration of intravenous contrast. CONTRAST:  18mL ISOVUE-300 IOPAMIDOL (ISOVUE-300) INJECTION 61% COMPARISON:  None. FINDINGS: CT CHEST FINDINGS Cardiovascular: Heart size is normal. Fleck like coronary artery calcification is seen, without advanced finding. No pericardial fluid. No thoracic aortic atherosclerosis. Pulmonary arteries as seen appear normal. Mediastinum/Nodes: No mediastinal or hilar mass or lymphadenopathy. Lungs/Pleura: There is bilateral  bronchopneumonia, most pronounced in the  posterior left lower lobe, but with small areas of patchy involvement also in the right lower lobe. No dense consolidation or lobar collapse. No pleural effusion. Musculoskeletal: Negative CT ABDOMEN PELVIS FINDINGS Hepatobiliary: Liver parenchyma is normal. Insignificant subcentimeter cyst at the caudal tip of the right lobe. No calcified gallstones. Pancreas: Normal Spleen: Normal Adrenals/Urinary Tract: Adrenal glands are normal. Kidneys are normal. No cyst, mass, stone or hydronephrosis. Bladder is normal. Stomach/Bowel: Liquid stool is noted. There is no evidence of obstruction or focal lesion however. The appendix is normal. Vascular/Lymphatic: Aortic atherosclerosis. No aneurysm. IVC is normal. No retroperitoneal adenopathy. Reproductive: Normal Other: No free fluid or air. Musculoskeletal: Ordinary lower lumbar degenerative changes. IMPRESSION: Left lower lobe pneumonia. Minimal, patchy involvement also in the right lower lobe. No dense consolidation or lobar collapse. No pleural effusion. Liquid stool, consistent with the clinical history of diarrhea. No specific abdominal finding of acute significance. Aortic atherosclerosis. Electronically Signed   By: Nelson Chimes M.D.   On: September 14, 2017 14:28   Ct Abdomen Pelvis W Contrast  Result Date: 09-14-17 CLINICAL DATA:  Pronounced weight loss over the last several weeks. Fever. EXAM: CT CHEST, ABDOMEN, AND PELVIS WITH CONTRAST TECHNIQUE: Multidetector CT imaging of the chest, abdomen and pelvis was performed following the standard protocol during bolus administration of intravenous contrast. CONTRAST:  117mL ISOVUE-300 IOPAMIDOL (ISOVUE-300) INJECTION 61% COMPARISON:  None. FINDINGS: CT CHEST FINDINGS Cardiovascular: Heart size is normal. Fleck like coronary artery calcification is seen, without advanced finding. No pericardial fluid. No thoracic aortic atherosclerosis. Pulmonary arteries as seen appear normal. Mediastinum/Nodes: No mediastinal or hilar mass  or lymphadenopathy. Lungs/Pleura: There is bilateral bronchopneumonia, most pronounced in the posterior left lower lobe, but with small areas of patchy involvement also in the right lower lobe. No dense consolidation or lobar collapse. No pleural effusion. Musculoskeletal: Negative CT ABDOMEN PELVIS FINDINGS Hepatobiliary: Liver parenchyma is normal. Insignificant subcentimeter cyst at the caudal tip of the right lobe. No calcified gallstones. Pancreas: Normal Spleen: Normal Adrenals/Urinary Tract: Adrenal glands are normal. Kidneys are normal. No cyst, mass, stone or hydronephrosis. Bladder is normal. Stomach/Bowel: Liquid stool is noted. There is no evidence of obstruction or focal lesion however. The appendix is normal. Vascular/Lymphatic: Aortic atherosclerosis. No aneurysm. IVC is normal. No retroperitoneal adenopathy. Reproductive: Normal Other: No free fluid or air. Musculoskeletal: Ordinary lower lumbar degenerative changes. IMPRESSION: Left lower lobe pneumonia. Minimal, patchy involvement also in the right lower lobe. No dense consolidation or lobar collapse. No pleural effusion. Liquid stool, consistent with the clinical history of diarrhea. No specific abdominal finding of acute significance. Aortic atherosclerosis. Electronically Signed   By: Nelson Chimes M.D.   On: 09/14/17 14:28       Today   Subjective:   Kristopher Davis patient feeling well denies any complaint Objective:   Blood pressure 125/81, pulse 71, temperature 98 F (36.7 C), temperature source Oral, resp. rate 16, height 6\' 4"  (1.93 m), weight 89.9 kg (198 lb 3.2 oz), SpO2 97 %.  .  Intake/Output Summary (Last 24 hours) at 08/26/2017 1547 Last data filed at 08/26/2017 0926 Gross per 24 hour  Intake 336 ml  Output -  Net 336 ml    Exam VITAL SIGNS: Blood pressure 125/81, pulse 71, temperature 98 F (36.7 C), temperature source Oral, resp. rate 16, height 6\' 4"  (1.93 m), weight 89.9 kg (198 lb 3.2 oz), SpO2 97  %.  GENERAL:  62 y.o.-year-old patient lying in the bed with no  acute distress.  EYES: Pupils equal, round, reactive to light and accommodation. No scleral icterus. Extraocular muscles intact.  HEENT: Head atraumatic, normocephalic. Oropharynx and nasopharynx clear.  NECK:  Supple, no jugular venous distention. No thyroid enlargement, no tenderness.  LUNGS: Normal breath sounds bilaterally, no wheezing, rales,rhonchi or crepitation. No use of accessory muscles of respiration.  CARDIOVASCULAR: S1, S2 normal. No murmurs, rubs, or gallops.  ABDOMEN: Soft, nontender, nondistended. Bowel sounds present. No organomegaly or mass.  EXTREMITIES: No pedal edema, cyanosis, or clubbing.  NEUROLOGIC: Cranial nerves II through XII are intact. Muscle strength 5/5 in all extremities. Sensation intact. Gait not checked.  PSYCHIATRIC: The patient is alert and oriented x 3.  SKIN: No obvious rash, lesion, or ulcer.   Data Review     CBC w Diff:  Lab Results  Component Value Date   WBC 7.3 08/24/2017   HGB 14.9 08/24/2017   HGB 14.8 07/13/2015   HCT 42.0 08/24/2017   HCT 44.1 07/13/2015   PLT 226 08/24/2017   PLT 225 07/13/2015   LYMPHOPCT 37 10/16/2015   MONOPCT 8 10/16/2015   EOSPCT 1 10/16/2015   BASOPCT 0 10/16/2015   CMP:  Lab Results  Component Value Date   NA 134 (L) 08/25/2017   NA 139 07/13/2015   K 3.3 (L) 08/25/2017   CL 105 08/25/2017   CO2 18 (L) 08/25/2017   BUN 13 08/25/2017   BUN 9 07/13/2015   CREATININE 0.99 08/25/2017   CREATININE 0.98 08/11/2017   PROT 6.6 11/04/2016   PROT 7.2 07/13/2015   ALBUMIN 3.5 (L) 10/16/2015   ALBUMIN 4.1 07/13/2015   BILITOT 0.6 11/04/2016   BILITOT 0.7 07/13/2015   ALKPHOS 71 10/16/2015   AST 34 11/04/2016   ALT 26 11/04/2016  .  Micro Results Recent Results (from the past 240 hour(s))  Blood culture (routine x 2)     Status: None (Preliminary result)   Collection Time: 08/23/17 12:08 PM  Result Value Ref Range Status   Specimen  Description BLOOD BLOOD RIGHT ARM  Final   Special Requests   Final    BOTTLES DRAWN AEROBIC AND ANAEROBIC Blood Culture adequate volume   Culture   Final    NO GROWTH 3 DAYS Performed at [facility], [address], [place], [place] [address]    Report Status PENDING  Incomplete  Blood culture (routine x 2)     Status: None (Preliminary result)   Collection Time: 08/23/17 12:08 PM  Result Value Ref Range Status   Specimen Description BLOOD LEFT ANTECUBITAL  Final   Special Requests   Final    BOTTLES DRAWN AEROBIC AND ANAEROBIC Blood Culture adequate volume   Culture   Final    NO GROWTH 3 DAYS Performed at [facility], [address] [place], [place] [address]    Report Status PENDING  Incomplete  Gastrointestinal Panel by PCR , Stool     Status: None   Collection Time: 08/24/17  9:26 AM  Result Value Ref Range Status   Campylobacter species NOT DETECTED NOT DETECTED Final   Plesimonas shigelloides NOT DETECTED NOT DETECTED Final   Salmonella species NOT DETECTED NOT DETECTED Final   Yersinia enterocolitica NOT DETECTED NOT DETECTED Final   Vibrio species NOT DETECTED NOT DETECTED Final   Vibrio cholerae NOT DETECTED NOT DETECTED Final   Enteroaggregative E coli (EAEC) NOT DETECTED NOT DETECTED Final   Enteropathogenic E coli (EPEC) NOT DETECTED NOT DETECTED Final   Enterotoxigenic E  coli (ETEC) NOT DETECTED NOT DETECTED Final   Shiga like toxin producing E coli (STEC) NOT DETECTED NOT DETECTED Final   Shigella/Enteroinvasive E coli (EIEC) NOT DETECTED NOT DETECTED Final   Cryptosporidium NOT DETECTED NOT DETECTED Final   Cyclospora cayetanensis NOT DETECTED NOT DETECTED Final   Entamoeba histolytica NOT DETECTED NOT DETECTED Final   Giardia lamblia NOT DETECTED NOT DETECTED Final   Adenovirus F40/41 NOT DETECTED NOT DETECTED Final   Astrovirus NOT DETECTED NOT DETECTED Final   Norovirus GI/GII NOT DETECTED NOT DETECTED Final   Rotavirus A NOT DETECTED  NOT DETECTED Final   Sapovirus (I, II, IV, and V) NOT DETECTED NOT DETECTED Final    Comment: Performed at [facility], [address] [place], [place] [address]  C difficile quick scan w PCR reflex     Status: None   Collection Time: 08/25/17  9:26 AM  Result Value Ref Range Status   C Diff antigen NEGATIVE NEGATIVE Final   C Diff toxin NEGATIVE NEGATIVE Final   C Diff interpretation No C. difficile detected.  Final    Comment: Performed at [facility], [address] [place], [place] [address]     Code Status History    Date Active Date Inactive Code Status Order ID Comments User Context   08/23/2017 1450 08/26/2017 1505 Full Code [id number]  Hillary Bow, MD ED          Follow-up Information    [place] Sizer, MD. Schedule an appointment as soon as possible for a visit in 6 days.   Specialty:  Family Medicine Why:  Office Closed  Contact information: [address] [place] [address] [phone]           Discharge Medications   Allergies as of 08/26/2017      Reactions   Levofloxacin In D5w    hallucinations      Medication List    STOP taking these medications   chlorthalidone 25 MG tablet Commonly known as:  HYGROTON     TAKE these medications   amoxicillin-clavulanate 875-125 MG tablet Commonly known as:  AUGMENTIN Take 1 tablet by mouth 2 (two) times daily for 7 days.   aspirin EC 81 MG tablet Take 1 tablet (81 mg total) by mouth daily.   azithromycin 250 MG tablet Commonly known as:  ZITHROMAX Take 1 tablet (250 mg total) by mouth daily for 4 days.   terbinafine 250 MG tablet Commonly known as:  LAMISIL Take 1 tablet (250 mg total) by mouth daily.          Total Time in preparing paper work, data evaluation and todays exam - 39 minutes  Dustin Flock M.D on 08/26/2017 at 3:47 PM [place]  [phone]

## 2017-08-26 NOTE — Plan of Care (Signed)

## 2017-08-26 NOTE — Progress Notes (Signed)
Patient discharged with spouse, verbalized understanding of education, patient with no complaints.

## 2017-08-28 ENCOUNTER — Telehealth: Payer: Self-pay

## 2017-08-28 LAB — CULTURE, BLOOD (ROUTINE X 2)
Culture: NO GROWTH
Culture: NO GROWTH
Special Requests: ADEQUATE
Special Requests: ADEQUATE

## 2017-08-28 NOTE — Telephone Encounter (Signed)
TOC #1. Called pt to f/u after d/c from [facility] on 08/26/17. At the time of this entry, pt has not yet scheduled a hosp f/u appt with Dr. Ancil Boozer. Discharge planning includes the following:  - Begin Augmentin and Zithromax  Unable to LVM requesting returned call d/t VM being full.

## 2017-08-28 NOTE — Telephone Encounter (Signed)
I called patient and spoke with him about scheduling an appointment. I put him on hold to transfer call and he hung up. Patient is aware that he needs to schedule an appointment for follow up with PCP within 6 days of his discharge.

## 2017-08-29 ENCOUNTER — Telehealth: Payer: Self-pay

## 2017-08-29 NOTE — Telephone Encounter (Signed)
Transition Care Management Follow-up Telephone Call  How have you been since you were released from the hospital? States he is feeling a little better. Weakness, dyspnea is improving. Recommended to drink 1-2 protein shakes per day to increase nutritional intake and to aid with weight gain.  Do you understand why you were in the hospital? yes  Do you have a copy of your discharge instructions Yes Do you understand the discharge instructions? yes  Where were you discharged to? Home  Do you have support at home? Yes    Items Reviewed:  Medications obtained Yes  Medications reviewed: Yes  Dietary changes reviewed: yes  Home Health? No  DME ordered at discharge obtained? No  Medical supplies: NA    Functional Questionnaire:   Activities of Daily Living (ADLs):   He states they are independent in the following: ambulation, bathing and hygiene, feeding, continence, grooming, toileting, dressing and medication management States they require assistance with the following: None  Any transportation issues/concerns?: no  Any patient concerns? no  Confirmed importance and date/time of follow-up visits scheduled with PCP: yes  Confirm appointment scheduled with specialist? NA  Confirmed with patient if condition begins to worsen call PCP or If it's emergency go to the ER.

## 2017-09-07 ENCOUNTER — Encounter: Payer: Self-pay | Admitting: Family Medicine

## 2017-09-07 ENCOUNTER — Ambulatory Visit: Payer: BLUE CROSS/BLUE SHIELD | Admitting: Family Medicine

## 2017-09-07 VITALS — BP 138/82 | HR 87 | Temp 98.5°F | Resp 18 | Ht 76.5 in | Wt 215.0 lb

## 2017-09-07 DIAGNOSIS — Z09 Encounter for follow-up examination after completed treatment for conditions other than malignant neoplasm: Secondary | ICD-10-CM | POA: Diagnosis not present

## 2017-09-07 DIAGNOSIS — I700 Atherosclerosis of aorta: Secondary | ICD-10-CM | POA: Diagnosis not present

## 2017-09-07 DIAGNOSIS — I10 Essential (primary) hypertension: Secondary | ICD-10-CM | POA: Diagnosis not present

## 2017-09-07 DIAGNOSIS — J181 Lobar pneumonia, unspecified organism: Secondary | ICD-10-CM

## 2017-09-07 DIAGNOSIS — J189 Pneumonia, unspecified organism: Secondary | ICD-10-CM

## 2017-09-07 DIAGNOSIS — E876 Hypokalemia: Secondary | ICD-10-CM

## 2017-09-07 NOTE — Progress Notes (Signed)
Name: Kristopher Davis   MRN: [id number]    DOB: Mar 20, 1955   Date:09/07/2017       Progress Note  Subjective  Chief Complaint  Chief Complaint  Patient presents with  . Hospitalization Follow-up    HPI  CAP: he lost weight the two weeks prior to admission followed by fever, chills , diarrhea. Seen by Suezanne Cheshire NP in our office on 08/23/2017 and sent to [facility] for evaluation. He was diagnosed with CAP left lower lobe and right upper lobe, had Chest CT and prior to discharge one episode of hallucination with negative CT brain. He was given Azithromycin and Augmentin upon discharge and took all medications as prescribed. Appetite is back to normal all he has gained 20 lbs back, almost at original baseline weight. No fever, chills, cough , nausea or vomiting and diarrhea has resolved. We will recheck potassium level since it was low and recheck CXR in a couple of weeks to confirm resolution. He has quit smoking since 08/20/2017  Atherosclerosis aorta: discussed results with patient, last LDL was 48. He refuses medication but is taking aspirin daily   Patient Active Problem List   Diagnosis Date Noted  . Aortic atherosclerosis ([place]) 08/24/2017  . Protein-calorie malnutrition, severe 08/24/2017  . Pneumonia 08/23/2017  . Elevated AST (SGOT) 07/14/2015  . Prostate cancer screening 07/09/2015  . Medication monitoring encounter 07/09/2015  . Need for hepatitis C screening test 07/09/2015  . Tinea 08/11/2014  . 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 Disease of hair and hair follicles 17/49/4496  . Benign essential HTN 01/07/2009  . Fatigue 01/18/2008  . Decreased libido 02/01/2007  . CD (contact dermatitis) 10/10/2006    Past Surgical History:  Procedure Laterality Date  . LUMBAR LAMINECTOMY      Family History  Problem Relation Age of Onset  . Cancer Mother   . Anemia Daughter     Social History   Socioeconomic History  . Marital status: Married    Spouse name: Not on file  . Number of  children: Not on file  . Years of education: Not on file  . Highest education level: Not on file  Occupational History  . Not on file  Social Needs  . Financial resource strain: Not on file  . Food insecurity:    Worry: Not on file    Inability: Not on file  . Transportation needs:    Medical: Not on file    Non-medical: Not on file  Tobacco Use  . Smoking status: Former Smoker    Packs/day: 0.25    Years: 12.00    Pack years: 3.00    Types: Cigarettes    Start date: 08/28/2017  . Smokeless tobacco: Former Systems developer    Quit date: 08/21/2017  Substance and Sexual Activity  . Alcohol use: No    Alcohol/week: 0.0 oz  . Drug use: No  . Sexual activity: Yes    Partners: Female  Lifestyle  . Physical activity:    Days per week: Not on file    Minutes per session: Not on file  . Stress: Not on file  Relationships  . Social connections:    Talks on phone: Not on file    Gets together: Not on file    Attends religious service: Not on file    Active member of club or organization: Not on file    Attends meetings of clubs or organizations: Not on file    Relationship status: Not on file  . Intimate partner violence:  Fear of current or ex partner: Not on file    Emotionally abused: Not on file    Physically abused: Not on file    Forced sexual activity: Not on file  Other Topics Concern  . Not on file  Social History Narrative  . Not on file     Current Outpatient Medications:  .  aspirin EC 81 MG tablet, Take 1 tablet (81 mg total) by mouth daily., Disp: 30 tablet, Rfl: 11  Allergies  Allergen Reactions  . Levofloxacin In D5w     hallucinations     ROS  Constitutional: Negative for fever, positive for  weight change.  Respiratory: Negative for cough and shortness of breath.   Cardiovascular: Negative for chest pain or palpitations.  Gastrointestinal: Negative for abdominal pain, no bowel changes.  Musculoskeletal: Negative for gait problem or joint swelling.   Skin: Negative for rash.  Neurological: Negative for dizziness or headache.  No other specific complaints in a complete review of systems (except as listed in HPI above).  Objective  Vitals:   09/07/17 1304  BP: 138/82  Pulse: 87  Resp: 18  Temp: 98.5 F (36.9 C)  TempSrc: Oral  SpO2: 98%  Weight: 215 lb (97.5 kg)  Height: 6' 4.5" (1.943 m)    Body mass index is 25.83 kg/m.  Physical Exam  Constitutional: Patient appears well-developed and well-nourished.  No distress.  HEENT: head atraumatic, normocephalic, pupils equal and reactive to light,  neck supple, throat within normal limits Cardiovascular: Normal rate, regular rhythm and normal heart sounds.  No murmur heard. No BLE edema. Pulmonary/Chest: Effort normal and breath sounds normal. No respiratory distress. Abdominal: Soft.  There is no tenderness. Psychiatric: Patient has a normal mood and affect. behavior is normal. Judgment and thought content normal.  Recent Results (from the past 2160 hour(s))  Basic Metabolic Panel (BMET)     Status: None   Collection Time: 08/11/17  4:32 PM  Result Value Ref Range   Glucose, Bld 84 65 - 139 mg/dL    Comment: .        Non-fasting reference interval .    BUN 10 7 - 25 mg/dL   Creat 0.98 0.70 - 1.25 mg/dL    Comment: For patients >52 years of age, the reference limit for Creatinine is approximately 13% higher for people identified as African-American. .    BUN/Creatinine Ratio NOT APPLICABLE 6 - 22 (calc)   Sodium 139 135 - 146 mmol/L   Potassium 3.6 3.5 - 5.3 mmol/L   Chloride 104 98 - 110 mmol/L   CO2 28 20 - 32 mmol/L   Calcium 8.9 8.6 - 10.3 mg/dL  CBC     Status: Abnormal   Collection Time: 08/23/17 10:31 AM  Result Value Ref Range   WBC 13.6 (H) 3.8 - 10.6 K/uL   RBC 5.00 4.40 - 5.90 MIL/uL   Hemoglobin 16.6 13.0 - 18.0 g/dL   HCT 47.1 40.0 - 52.0 %   MCV 94.2 80.0 - 100.0 fL   MCH 33.2 26.0 - 34.0 pg   MCHC 35.3 32.0 - 36.0 g/dL   RDW 13.5 11.5 - 14.5  %   Platelets 254 150 - 440 K/uL    Comment: Performed at [facility], [address] [place], [place] [address]  Basic metabolic panel     Status: Abnormal   Collection Time: 08/23/17 11:25 AM  Result Value Ref Range   Sodium 132 (L) 135 - 145 mmol/L  Potassium 2.8 (L) 3.5 - 5.1 mmol/L   Chloride 96 (L) 98 - 111 mmol/L    Comment: Please note change in reference range.   CO2 23 22 - 32 mmol/L   Glucose, Bld 136 (H) 70 - 99 mg/dL    Comment: Please note change in reference range.   BUN 16 8 - 23 mg/dL    Comment: Please note change in reference range.   Creatinine, Ser 1.52 (H) 0.61 - 1.24 mg/dL   Calcium 8.6 (L) 8.9 - 10.3 mg/dL   GFR calc non Af Amer 48 (L) >60 mL/min   GFR calc Af Amer 55 (L) >60 mL/min    Comment: (NOTE) The eGFR has been calculated using the CKD EPI equation. This calculation has not been validated in all clinical situations. eGFR's persistently <60 mL/min signify possible Chronic Kidney Disease.    Anion gap 13 5 - 15    Comment: Performed at [facility], [address] [place], [place] [address]  Troponin I     Status: None   Collection Time: 08/23/17 11:32 AM  Result Value Ref Range   Troponin I <0.03 <0.03 ng/mL    Comment: Performed at [facility], [address] [place], [place] [address]  Blood culture (routine x 2)     Status: None   Collection Time: 08/23/17 12:08 PM  Result Value Ref Range   Specimen Description BLOOD BLOOD RIGHT ARM    Special Requests      BOTTLES DRAWN AEROBIC AND ANAEROBIC Blood Culture adequate volume   Culture      NO GROWTH 5 DAYS Performed at [facility], [address] [place], [place] [address]    Report Status 08/28/2017 FINAL   Blood culture (routine x 2)     Status: None   Collection Time: 08/23/17 12:08 PM  Result Value Ref Range   Specimen Description BLOOD LEFT ANTECUBITAL    Special Requests      BOTTLES DRAWN AEROBIC AND ANAEROBIC Blood Culture adequate  volume   Culture      NO GROWTH 5 DAYS Performed at [facility], [address] [place], [place] [address]    Report Status 08/28/2017 FINAL   Lactic acid, plasma     Status: None   Collection Time: 08/23/17 12:08 PM  Result Value Ref Range   Lactic Acid, Venous 1.9 0.5 - 1.9 mmol/L    Comment: Performed at [facility], [address] [place], [place] [address]  Blood gas, venous     Status: Abnormal   Collection Time: 08/23/17 12:08 PM  Result Value Ref Range   pH, Ven 7.39 7.250 - 7.430   pCO2, Ven 43 (L) 44.0 - 60.0 mmHg   pO2, Ven <31.0 (LL) 32.0 - 45.0 mmHg    Comment: VENOUS   Bicarbonate 26.0 20.0 - 28.0 mmol/L   Acid-Base Excess 0.7 0.0 - 2.0 mmol/L   Patient temperature 37.0    Collection site VEIN    Sample type VENOUS     Comment: Performed at [facility], [place], [place] [address]  Rapid HIV screen (HIV 1/2 Ab+Ag)     Status: None   Collection Time: 08/23/17 12:08 PM  Result Value Ref Range   HIV-1 P24 Antigen - HIV24 NON REACTIVE NON REACTIVE   HIV 1/2 Antibodies NON REACTIVE NON REACTIVE   Interpretation (HIV Ag Ab)      A non reactive test result means that HIV 1  or HIV 2 antibodies and HIV 1 p24 antigen were not detected in the specimen.    Comment: Performed at [facility], [address] [place], [place] [address]  Sedimentation rate     Status: None   Collection Time: 08/23/17 12:08 PM  Result Value Ref Range   Sed Rate 6 0 - 20 mm/hr    Comment: Performed at [facility], [address] [place], [place] [address]  C-reactive protein     Status: Abnormal   Collection Time: 08/23/17 12:08 PM  Result Value Ref Range   CRP 16.8 (H) <1.0 mg/dL    Comment: Performed at [facility], [place] [address] [place], [place] [address]  Magnesium     Status: None   Collection Time: 08/23/17 12:08 PM  Result Value Ref Range   Magnesium 1.8 1.7 - 2.4 mg/dL    Comment: Performed at [facility], [address] [place], [place] [address]  TSH     Status: None   Collection Time: 08/23/17 12:08 PM  Result Value Ref Range   TSH 1.194 0.350 - 4.500 uIU/mL    Comment: Performed by a 3rd Generation assay with a functional sensitivity of <=0.01 uIU/mL. Performed at [facility], [address] [place], [place] [address]   Influenza panel by PCR (type A & B)     Status: None   Collection Time: 08/23/17 12:09 PM  Result Value Ref Range   Influenza A By PCR NEGATIVE NEGATIVE   Influenza B By PCR NEGATIVE NEGATIVE    Comment: (NOTE) The Xpert Xpress Flu assay is intended as an aid in the diagnosis of  influenza and should not be used as a sole basis for treatment.  This  assay is FDA approved for nasopharyngeal swab specimens only. Nasal  washings and aspirates are unacceptable for Xpert Xpress Flu testing. Performed at [facility], [address] [place], [place] [address]   Vitamin B12     Status: None   Collection Time: 08/23/17  3:15 PM  Result Value Ref Range   Vitamin B-12 182 180 - 914 pg/mL    Comment: (NOTE) This assay is not validated for testing neonatal or myeloproliferative syndrome specimens for Vitamin B12 levels. Performed at [facility], [place] [address], [place],  [address]   Urinalysis, Complete w Microscopic     Status: Abnormal   Collection Time: 08/23/17  3:30 PM  Result Value Ref Range   Color, Urine YELLOW (A) YELLOW   APPearance CLEAR (A) CLEAR   Specific Gravity, Urine 1.033 (H) 1.005 - 1.030   pH 5.0 5.0 - 8.0   Glucose, UA NEGATIVE NEGATIVE mg/dL   Hgb urine dipstick SMALL (A) NEGATIVE   Bilirubin Urine NEGATIVE NEGATIVE   Ketones, ur NEGATIVE NEGATIVE mg/dL   Protein, ur NEGATIVE NEGATIVE mg/dL   Nitrite NEGATIVE NEGATIVE   Leukocytes, UA NEGATIVE NEGATIVE   RBC / HPF 0-5 0 - 5 RBC/hpf   WBC, UA 0-5 0 - 5 WBC/hpf   Bacteria, UA NONE SEEN NONE SEEN   Squamous Epithelial / LPF 0-5 0 - 5   Mucus  PRESENT    Hyaline Casts, UA PRESENT     Comment: Performed at [facility], [address], [place],  [address]  Basic metabolic panel     Status: Abnormal   Collection Time: 08/24/17  6:42 AM  Result Value Ref Range   Sodium 132 (L) 135 - 145  mmol/L   Potassium 2.9 (L) 3.5 - 5.1 mmol/L   Chloride 104 98 - 111 mmol/L    Comment: Please note change in reference range.   CO2 17 (L) 22 - 32 mmol/L   Glucose, Bld 115 (H) 70 - 99 mg/dL    Comment: Please note change in reference range.   BUN 13 8 - 23 mg/dL    Comment: Please note change in reference range.   Creatinine, Ser 1.13 0.61 - 1.24 mg/dL   Calcium 8.6 (L) 8.9 - 10.3 mg/dL   GFR calc non Af Amer >60 >60 mL/min   GFR calc Af Amer >60 >60 mL/min    Comment: (NOTE) The eGFR has been calculated using the CKD EPI equation. This calculation has not been validated in all clinical situations. eGFR's persistently <60 mL/min signify possible Chronic Kidney Disease.    Anion gap 11 5 - 15    Comment: Performed at [facility], [address] [place], [place] [address]  CBC     Status: None   Collection Time: 08/24/17  6:42 AM  Result Value Ref Range   WBC 7.3 3.8 - 10.6 K/uL   RBC 4.48 4.40 - 5.90 MIL/uL   Hemoglobin 14.9 13.0 - 18.0 g/dL   HCT 42.0 40.0 - 52.0 %   MCV 93.9 80.0 - 100.0 fL   MCH 33.3 26.0 - 34.0 pg   MCHC 35.4 32.0 - 36.0 g/dL   RDW 13.8 11.5 - 14.5 %   Platelets 226 150 - 440 K/uL    Comment: Performed at [facility], [address] [place], [place] [address]  Gastrointestinal Panel by PCR , Stool     Status: None   Collection Time: 08/24/17  9:26 AM  Result Value Ref Range   Campylobacter species NOT DETECTED NOT DETECTED   Plesimonas shigelloides NOT DETECTED NOT DETECTED   Salmonella species NOT DETECTED NOT DETECTED   Yersinia enterocolitica NOT DETECTED NOT DETECTED   Vibrio species NOT DETECTED NOT DETECTED   Vibrio cholerae NOT DETECTED NOT DETECTED    Enteroaggregative E coli (EAEC) NOT DETECTED NOT DETECTED   Enteropathogenic E coli (EPEC) NOT DETECTED NOT DETECTED   Enterotoxigenic E coli (ETEC) NOT DETECTED NOT DETECTED   Shiga like toxin producing E coli (STEC) NOT DETECTED NOT DETECTED   Shigella/Enteroinvasive E coli (EIEC) NOT DETECTED NOT DETECTED   Cryptosporidium NOT DETECTED NOT DETECTED   Cyclospora cayetanensis NOT DETECTED NOT DETECTED   Entamoeba histolytica NOT DETECTED NOT DETECTED   Giardia lamblia NOT DETECTED NOT DETECTED   Adenovirus F40/41 NOT DETECTED NOT DETECTED   Astrovirus NOT DETECTED NOT DETECTED   Norovirus GI/GII NOT DETECTED NOT DETECTED   Rotavirus A NOT DETECTED NOT DETECTED   Sapovirus (I, II, IV, and V) NOT DETECTED NOT DETECTED    Comment: Performed at [facility], [address] [place], [place] [address]  Basic metabolic panel     Status: Abnormal   Collection Time: 08/25/17  4:46 AM  Result Value Ref Range   Sodium 134 (L) 135 - 145 mmol/L   Potassium 3.3 (L) 3.5 - 5.1 mmol/L   Chloride 105 98 - 111 mmol/L    Comment: Please note change in reference range.   CO2 18 (L) 22 - 32 mmol/L   Glucose, Bld 102 (H) 70 - 99 mg/dL    Comment: Please note change in reference range.   BUN 13 8 - 23 mg/dL    Comment: Please note  change in reference range.   Creatinine, Ser 0.99 0.61 - 1.24 mg/dL   Calcium 8.3 (L) 8.9 - 10.3 mg/dL   GFR calc non Af Amer >60 >60 mL/min   GFR calc Af Amer >60 >60 mL/min    Comment: (NOTE) The eGFR has been calculated using the CKD EPI equation. This calculation has not been validated in all clinical situations. eGFR's persistently <60 mL/min signify possible Chronic Kidney Disease.    Anion gap 11 5 - 15    Comment: Performed at [facility], [address] [place], [place] [address]  Magnesium     Status: None   Collection Time: 08/25/17  4:46 AM  Result Value Ref Range   Magnesium 2.2 1.7 - 2.4 mg/dL    Comment: Performed at [facility], [address] [place], [place] [address]  C difficile quick scan w PCR reflex     Status: None   Collection Time: 08/25/17  9:26 AM  Result Value Ref Range   C Diff antigen NEGATIVE NEGATIVE   C Diff toxin NEGATIVE NEGATIVE   C Diff interpretation No C. difficile detected.     Comment: Performed at [facility], [address] [place], [place] [address]      PHQ2/9: Depression screen [facility] 2/9 11/04/2016 04/12/2016 10/26/2015 10/16/2015 07/09/2015  Decreased Interest 0 0 0 0 0  Down, Depressed, Hopeless 0 0 0 0 0  PHQ - 2 Score 0 0 0 0 0    Fall Risk: Fall Risk  08/11/2017 11/04/2016 04/12/2016 10/26/2015 10/16/2015  Falls in the past year? _0   Number falls in past yr: - - - - -     Assessment & Plan  1. Hospital discharge follow-up  Reviewed records, we will recheck CXR and also basic panel today, he finished antibiotics as prescribed, gained weight back and is feeling well today   2. Community acquired pneumonia of left lower lobe of lung ([place])  - DG Chest 2 View; Future  3. Aortic atherosclerosis ([place])  He refuses statin therapy   4. Benign essential HTN  - BASIC METABOLIC PANEL WITH GFR  5. Hypokalemia  - BASIC METABOLIC PANEL WITH GFR

## 2017-09-11 ENCOUNTER — Ambulatory Visit: Payer: BLUE CROSS/BLUE SHIELD | Admitting: Nurse Practitioner

## 2017-09-12 ENCOUNTER — Ambulatory Visit: Payer: BLUE CROSS/BLUE SHIELD | Admitting: Nurse Practitioner

## 2017-09-21 ENCOUNTER — Ambulatory Visit
Admission: RE | Admit: 2017-09-21 | Discharge: 2017-09-21 | Disposition: A | Discharge status: Home / Self Care | Payer: BLUE CROSS/BLUE SHIELD | Source: Ambulatory Visit | Attending: Family Medicine | Admitting: Family Medicine

## 2017-09-21 DIAGNOSIS — J181 Lobar pneumonia, unspecified organism: Secondary | ICD-10-CM | POA: Insufficient documentation

## 2017-09-21 DIAGNOSIS — J189 Pneumonia, unspecified organism: Secondary | ICD-10-CM

## 2018-01-17 ENCOUNTER — Ambulatory Visit: Payer: BLUE CROSS/BLUE SHIELD | Admitting: Family Medicine

## 2018-01-17 ENCOUNTER — Encounter: Payer: Self-pay | Admitting: Family Medicine

## 2018-01-17 VITALS — BP 140/86 | HR 85 | Temp 98.3°F | Resp 18 | Ht 77.0 in | Wt 223.1 lb

## 2018-01-17 DIAGNOSIS — L02212 Cutaneous abscess of back [any part, except buttock]: Secondary | ICD-10-CM | POA: Diagnosis not present

## 2018-01-17 MED ORDER — SULFAMETHOXAZOLE-TRIMETHOPRIM 800-160 MG PO TABS: 1.0000 | ORAL_TABLET | Freq: Two times a day (BID) | ORAL | 0 refills | Status: AC

## 2018-01-17 NOTE — Progress Notes (Signed)
Name: Kristopher Davis   MRN: [id number]    DOB: 1956/01/20   Date:01/17/2018       Progress Note  Subjective  Chief Complaint  Chief Complaint  Patient presents with  . Cyst    knot on back, changed in apperance, sore,itching, hard to lay on back for 1 week    HPI  Pt presents with concern for abscess on the back - he was leaning up against a wall and felt pain on his back last week.  His wife looked and found this lesion.  The area is painful, drainage has been yellow and clear.  Wife has been applying peroxide. The area is somewhat tender - worse at night. No fevers or chills.   Patient Active Problem List   Diagnosis Date Noted  . Aortic atherosclerosis ([place]) 08/24/2017  . Protein-calorie malnutrition, severe 08/24/2017  . Pneumonia 08/23/2017  . Elevated AST (SGOT) 07/14/2015  . Prostate cancer screening 07/09/2015  . Medication monitoring encounter 07/09/2015  . Need for hepatitis C screening test 07/09/2015  . Tinea 08/11/2014  . 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 Disease of hair and hair follicles 25/42/7062  . Benign essential HTN 01/07/2009  . Fatigue 01/18/2008  . Decreased libido 02/01/2007  . CD (contact dermatitis) 10/10/2006    Social History   Tobacco Use  . Smoking status: Former Smoker    Packs/day: 0.25    Years: 12.00    Pack years: 3.00    Types: Cigarettes    Start date: 08/28/2017  . Smokeless tobacco: Former Systems developer    Quit date: 08/21/2017  Substance Use Topics  . Alcohol use: No    Alcohol/week: 0.0 standard drinks     Current Outpatient Medications:  .  aspirin EC 81 MG tablet, Take 1 tablet (81 mg total) by mouth daily., Disp: 30 tablet, Rfl: 11  Allergies  Allergen Reactions  . Levofloxacin In D5w     hallucinations    I personally reviewed active problem list, medication list, allergies, lab results with the patient/caregiver today.  ROS  Constitutional: Negative for fever or weight change.  Respiratory: Negative for cough and shortness of  breath.   Cardiovascular: Negative for chest pain or palpitations.  Gastrointestinal: Negative for abdominal pain, no bowel changes.  Musculoskeletal: Negative for gait problem or joint swelling.  Skin: Negative for rash. See HPI Neurological: Negative for dizziness or headache.  No other specific complaints in a complete review of systems (except as listed in HPI above).   Objective  Vitals:   01/17/18 0812  BP: 140/86  Pulse: 85  Resp: 18  Temp: 98.3 F (36.8 C)  TempSrc: Oral  SpO2: 98%  Weight: 223 lb 1.6 oz (101.2 kg)  Height: 6\' 5"  (1.956 m)   Body mass index is 26.46 kg/m.  Nursing Note and Vital Signs reviewed.  Physical Exam  Constitutional: Patient appears well-developed and well-nourished. No distress.  HENT: Head: Normocephalic and atraumatic. Mouth/Throat: Oropharynx is clear and moist. No oropharyngeal exudate or tonsillar swelling.  Eyes: Conjunctivae and EOM are normal. No scleral icterus.  Pupils are equal, round, and reactive to light.  Neck: Normal range of motion. Neck supple. No JVD present. No thyromegaly present.  Cardiovascular: Normal rate, regular rhythm and normal heart sounds.  No murmur heard. No BLE edema. Pulmonary/Chest: Effort normal and breath sounds normal. No respiratory distress. Musculoskeletal: Normal range of motion, no joint effusions. No gross deformities Neurological: Pt is alert and oriented to person, place, and time. No cranial nerve  deficit. Coordination, balance, strength, speech and gait are normal.  Skin: Skin is warm and dry. No rash noted. 2.5in raised abscess with minimal tenderness and minimal erythema, moderate fluctuance is present to the center of the upper back. Psychiatric: Patient has a normal mood and affect. behavior is normal. Judgment and thought content normal.  No results found for this or any previous visit (from the past 72 hour(s)).  Assessment & Plan  1. Abscess of back - Warm compresses PRN. -  sulfamethoxazole-trimethoprim (BACTRIM DS,SEPTRA DS) 800-160 MG tablet; Take 1 tablet by mouth 2 (two) times daily for 7 days.  Dispense: 14 tablet; Refill: 0 - CBC Consent signed: YES Procedure: Incision and drainage of abscess Location: Central Upper back Equipment used: sterile scalpel, tweezers, curved scissors Anesthesia:  1% Lidocaine w/o Epinephrine  Cleaned and prepped: Betadine & Alcohol swab After consent signed, affected area of skin prepped with betadine. Lidocaine w/o epinephrine injected into surround areas and area of intended incisions directly over greatest area of fluctuance of abscess. After properly numbed, 1cm incision with #10 blade made over abscess. Small amount of purulent and sanguinous material expressed, abscess cavity and loculations debrided.  Covered area with sterile guaze and bandage applied. Instructed on dressing changes, materials given, pain medications discussed. Packing is not applied because patient states he will absolutely not come back unless he is not improving - refusing to schedule follow up appointment.   Discussed risk of procedure, especially given location of the abscess in the center of his back - will check CBC. He refuses referral to surgery.  -Red flags and when to present for emergency care or RTC including fever >101.37F, chest pain, shortness of breath, new/worsening/un-resolving symptoms, extremity weakness/numbness/tingling, malaise reviewed with patient at time of visit. Follow up and care instructions discussed and provided in AVS. - I recommend a wound recheck in 3 days, however the patient refuses this and states will only return if not improving.

## 2018-05-22 ENCOUNTER — Ambulatory Visit (INDEPENDENT_AMBULATORY_CARE_PROVIDER_SITE_OTHER): Payer: BLUE CROSS/BLUE SHIELD | Admitting: Family Medicine

## 2018-05-22 ENCOUNTER — Other Ambulatory Visit: Payer: Self-pay

## 2018-05-22 ENCOUNTER — Encounter: Payer: Self-pay | Admitting: Family Medicine

## 2018-05-22 VITALS — BP 128/86 | HR 90 | Temp 98.3°F | Resp 16 | Ht 76.5 in | Wt 215.7 lb

## 2018-05-22 DIAGNOSIS — Z13220 Encounter for screening for lipoid disorders: Secondary | ICD-10-CM

## 2018-05-22 DIAGNOSIS — I10 Essential (primary) hypertension: Secondary | ICD-10-CM | POA: Diagnosis not present

## 2018-05-22 DIAGNOSIS — Z23 Encounter for immunization: Secondary | ICD-10-CM | POA: Diagnosis not present

## 2018-05-22 DIAGNOSIS — G44039 Episodic paroxysmal hemicrania, not intractable: Secondary | ICD-10-CM | POA: Diagnosis not present

## 2018-05-22 DIAGNOSIS — I700 Atherosclerosis of aorta: Secondary | ICD-10-CM | POA: Diagnosis not present

## 2018-05-22 DIAGNOSIS — Z131 Encounter for screening for diabetes mellitus: Secondary | ICD-10-CM | POA: Diagnosis not present

## 2018-05-22 MED ORDER — HYDROCHLOROTHIAZIDE 12.5 MG PO TABS: 12.5000 mg | ORAL_TABLET | Freq: Every day | ORAL | 0 refills | Status: DC

## 2018-05-22 MED ORDER — INDOMETHACIN 50 MG PO CAPS: 50.0000 mg | ORAL_CAPSULE | Freq: Three times a day (TID) | ORAL | 0 refills | Status: DC

## 2018-05-22 NOTE — Progress Notes (Signed)
Name: Kristopher Davis   MRN: [id number]    DOB: 03/11/1955   Date:05/22/2018       Progress Note  Subjective  Chief Complaint  Chief Complaint  Patient presents with  . Headache  . Eye Pain  . Nasal Congestion    HPI  Hemicrania: he states new onset of severe headache only on left side of head. He states he noticed left nostril rhinorrhea 6 days ago. He states he blew his nose and developed severe pain behind his left eye that radiated to his left sinus and left frontal area. Pain on the face and eye throbbing like, but tingling on top of his head. Episodes can last hour , he states sometimes associated with left eye redness but not tearing, sometimes itchy. No blurred vision. No nausea or vomiting. No weakness or double vision. Improves with Tylenol. Symptoms worse in the pm when he gets home from work. Denies increase in stress.   HTN: he has history of hypertension, used to take medications, but off for a long time. At home bp has been 130's/80's, however bp is elevated today, explained that indomethacin will raise his bp and advised to resume hctz.   Atherosclerosis of aorta: advised statin therapy Advised lipid panel   Patient Active Problem List   Diagnosis Date Noted  . Aortic atherosclerosis ([place]) 08/24/2017  . Pneumonia 08/23/2017  . Elevated AST (SGOT) 07/14/2015  . Prostate cancer screening 07/09/2015  . Medication monitoring encounter 07/09/2015  . Need for hepatitis C screening test 07/09/2015  . Tinea 08/11/2014  . 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 Disease of hair and hair follicles 81/82/9937  . Benign essential HTN 01/07/2009  . Fatigue 01/18/2008  . Decreased libido 02/01/2007  . CD (contact dermatitis) 10/10/2006    Past Surgical History:  Procedure Laterality Date  . LUMBAR LAMINECTOMY      Family History  Problem Relation Age of Onset  . Cancer Mother   . Anemia Daughter     Social History   Socioeconomic History  . Marital status: Married    Spouse name: Not on  file  . Number of children: Not on file  . Years of education: Not on file  . Highest education level: Not on file  Occupational History  . Not on file  Social Needs  . Financial resource strain: Not on file  . Food insecurity:    Worry: Not on file    Inability: Not on file  . Transportation needs:    Medical: Not on file    Non-medical: Not on file  Tobacco Use  . Smoking status: Former Smoker    Packs/day: 0.25    Years: 12.00    Pack years: 3.00    Types: Cigarettes    Start date: 08/28/2017  . Smokeless tobacco: Former Systems developer    Quit date: 08/21/2017  Substance and Sexual Activity  . Alcohol use: No    Alcohol/week: 0.0 standard drinks  . Drug use: No  . Sexual activity: Yes    Partners: Female  Lifestyle  . Physical activity:    Days per week: Not on file    Minutes per session: Not on file  . Stress: Not on file  Relationships  . Social connections:    Talks on phone: Not on file    Gets together: Not on file    Attends religious service: Not on file    Active member of club or organization: Not on file    Attends meetings of clubs  or organizations: Not on file    Relationship status: Not on file  . Intimate partner violence:    Fear of current or ex partner: Not on file    Emotionally abused: Not on file    Physically abused: Not on file    Forced sexual activity: Not on file  Other Topics Concern  . Not on file  Social History Narrative  . Not on file     Current Outpatient Medications:  .  acetaminophen (TYLENOL) 500 MG tablet, Take 500 mg by mouth every 6 (six) hours as needed for moderate pain or headache., Disp: , Rfl:  .  aspirin EC 81 MG tablet, Take 1 tablet (81 mg total) by mouth daily., Disp: 30 tablet, Rfl: 11 .  hydrochlorothiazide (HYDRODIURIL) 12.5 MG tablet, Take 1 tablet (12.5 mg total) by mouth daily., Disp: 90 tablet, Rfl: 0 .  indomethacin (INDOCIN) 50 MG capsule, Take 1 capsule (50 mg total) by mouth 3 (three) times daily with meals.,  Disp: 30 capsule, Rfl: 0  Allergies  Allergen Reactions  . Levofloxacin In D5w     hallucinations    I personally reviewed active problem list, medication list, allergies, family history, social history with the patient/caregiver today.   ROS  Ten systems reviewed and is negative except as mentioned in HPI   Objective  Vitals:   05/22/18 0910 05/22/18 0920 05/22/18 1021  BP: (!) 158/92 (!) 152/88 128/86  Pulse: 90    Resp: 16    Temp: 98.3 F (36.8 C)    TempSrc: Oral    SpO2: 95%    Weight: 215 lb 11.2 oz (97.8 kg)    Height: 6' 4.5" (1.943 m)      Body mass index is 25.91 kg/m.  Physical Exam  Constitutional: Patient appears well-developed and well-nourished. No distress.  HEENT: head atraumatic, normocephalic, pupils equal and reactive to light, ears normal TM bilaterally,  neck supple, throat within normal limits Cardiovascular: Normal rate, regular rhythm and normal heart sounds.  No murmur heard. No BLE edema. Pulmonary/Chest: Effort normal and breath sounds normal. No respiratory distress. Abdominal: Soft.  There is no tenderness. Psychiatric: Patient has a normal mood and affect. behavior is normal. Judgment and thought content normal. Neurological: normal cranial nerves, normal sensation, Romberg negative, normal gait, normal grip  PHQ2/9: Depression screen [facility] 2/9 05/22/2018 01/17/2018 11/04/2016 04/12/2016 10/26/2015  Decreased Interest 0 0 0 0 0  Down, Depressed, Hopeless 0 0 0 0 0  PHQ - 2 Score 0 0 0 0 0  Altered sleeping 0 0 - - -  Tired, decreased energy 0 0 - - -  Change in appetite 0 0 - - -  Feeling bad or failure about yourself  0 0 - - -  Trouble concentrating 0 0 - - -  Moving slowly or fidgety/restless 0 0 - - -  Suicidal thoughts 0 0 - - -  PHQ-9 Score 0 0 - - -  Difficult doing work/chores Not difficult at all Not difficult at all - - -     Fall Risk: Fall Risk  05/22/2018 01/17/2018 08/11/2017 11/04/2016 04/12/2016  Falls in the past year?  0 0 No No No  Number falls in past yr: 0 - - - -  Injury with Fall? 0 - - - -     Functional Status Survey: Is the patient deaf or have difficulty hearing?: No Does the patient have difficulty seeing, even when wearing glasses/contacts?: Yes(glasses) Does the patient have  difficulty concentrating, remembering, or making decisions?: No Does the patient have difficulty walking or climbing stairs?: No Does the patient have difficulty dressing or bathing?: No Does the patient have difficulty doing errands alone such as visiting a doctor's office or shopping?: No    Assessment & Plan   1. Episodic paroxysmal hemicrania, not intractable  - MR Brain Wo Contrast; Future - Sed Rate (ESR) - C-reactive protein - Prolactin - indomethacin (INDOCIN) 50 MG capsule; Take 1 capsule (50 mg total) by mouth 3 (three) times daily with meals.  Dispense: 30 capsule; Refill: 0  2. Needs flu shot  refused  3. Aortic atherosclerosis ([place])  He refused cholesterol medication , once he is done with indomethacin advised to go back on aspirin   4. Benign essential HTN  - COMPLETE METABOLIC PANEL WITH GFR - CBC with Differential/Platelet - hydrochlorothiazide (HYDRODIURIL) 12.5 MG tablet; Take 1 tablet (12.5 mg total) by mouth daily.  Dispense: 90 tablet; Refill: 0 He will take medication if bp is elevated at home, otherwise he will hold off for now since bp went down before he left   5. Lipid screening  - Lipid panel  6. Diabetes mellitus screening  - HgB A1c

## 2018-05-22 NOTE — Patient Instructions (Signed)
Indomethacin-Responsive Headache, Adult An indomethacin-responsive headache is a headache that gets better when you take indomethacin. Indomethacin is a kind of NSAID (nonsteroidal anti-inflammatory drug). Indomethacin can quickly stop the pain from some kinds of headaches, such as:  Paroxysmal hemicrania. This is a series of short, severe headaches, usually on just one side of the head.  Hemicrania continua. Pain is nonstop and on one side of the face.  Primary exertional headache. Exercise sets off these headaches.  Primary cough headache. Pain may come from pressure in the brain when coughing or straining. What are the causes? The exact cause of this condition is not known. Certain conditions may start (trigger) a headache. They include:  Moving the head in certain ways.  Stress.  Pressure on sensitive areas of the neck.  Drinking alcohol.  Exercise.  Coughing and sneezing. What increases the risk? The following factors may make you more likely to develop this condition:  Being 52 years of age or older.  Having a serious head injury.  Having migraine headaches.  Having a family history of this condition. What are the signs or symptoms? Symptoms of this condition depend on the kind of headache you have.  Paroxysmal hemicrania: ? Having about 10 headaches a day. Each may last from a few minutes to 2 hours. ? Severe, pounding pain. ? Pain usually on just one side of the head. It often centers around the eye or in the forehead. ? A watery eye, which may become red or swollen. ? A droopy or swollen eyelid. ? Sweating and having a red or pinkish face. ? A stuffy, runny nose.  Hemicrania continua: ? All-day headache. This may occur daily for at least 3 months. Then there may be no headaches for weeks or months. ? Pain that gets worse several times during the day. ? Pain in the face, on one side only. It almost always occurs on the same side. ? A watery eye. It may also  become droopy, red, and swollen. ? A stuffy, runny nose. ? Pain that gets worse with sound or light.  Primary exertional headache: ? Pain during physical activity. ? Pounding or throbbing pain. ? Pain that lasts for 5 minutes to 48 hours, or sometimes longer.  Primary cough headache: ? Pain that starts after coughing, sneezing, or straining. ? Sharp, stabbing pain. ? Pain on both sides of the head. It is often worse in the back of the head. ? Pain that is severe for a few minutes and then dull for several hours. How is this diagnosed? This condition may be diagnosed based on:  Symptoms and medical history. Your health care provider will ask you questions about your headaches.  Physical exam.  Tests that may include: ? Blood and urine tests. ? Spinal tap (lumbar puncture). This tests a sample of fluid from your spine. The test checks for infection, bleeding in your brain (brain hemorrhage), or extra pressure inside your skull. ? Ultrasound, MRI, CT scan, or other imaging tests. If no medical condition is causing your headaches, you will be given indomethacin. If yours is an indomethacin-responsive headache, your symptoms should go away quickly. How is this treated?  By taking indomethacin.  With other medicines: ? To prevent or treat stomach ulcers. ? To relieve stomachache or heartburn (antacids). ? For nausea. Follow these instructions at home: Lifestyle  Rest in a dark, quiet room.  Put a cool, damp washcloth on your head or face.  Get plenty of sleep. Most adults should get  at least 7-9 hours of sleep each night.  Eat on a regular schedule. Do not skip meals.  Limit alcohol intake to no more than 1 drink per day for non-pregnant women and 2 drinks per day for men. One drink equals 12 ounces of beer, 5 ounces of wine, or 1 ounces of hard liquor.  Do not use any products that contain nicotine or tobacco, such as cigarettes and e-cigarettes. If you need help quitting,  ask your health care provider. Headache diary Keep a headache diary. This will help you and your health care provider determine what is triggering your headaches. Each time you have a headache, write down:  When it started and stopped. Include the day and time.  How it felt.  Any triggers, such as noise, stress, or foods.  Any medicines you took.  General instructions  Take over-the-counter and prescription medicines only as told by your health care provider. ? Do not take other NSAIDs, such as ibuprofen, with indomethacin.  Tell your health care provider about all medicines you are taking, including vitamins, herbs, eye drops, creams, and over-the-counter medicines.  Keep all follow-up visits as told by your health care provider. This is important. Contact a health care provider if:  Your pain continues even with treatment.  You have nausea.  You have a fever. Get help right away if:  You have bad stomach pain or vomiting.  You vomit blood.  You have blood in your stool.  You have chest pain.  You have any symptoms of a stroke. "BE FAST" is an easy way to remember the main warning signs of a stroke: ? B - Balance. Signs are dizziness, sudden trouble walking, or loss of balance. ? E - Eyes. Signs are trouble seeing or a sudden change in vision. ? F - Face. Signs are sudden weakness or numbness of the face, or the face or eyelid drooping on one side. ? A - Arms. Signs are weakness or numbness in an arm. This happens suddenly and usually on one side of the body. ? S - Speech. Signs are sudden trouble speaking, slurred speech, or trouble understanding what people say. ? T - Time. Time to call emergency services. Write down what time symptoms started.  You have other signs of a stroke, such as: ? A sudden, severe headache. ? Nausea or vomiting. ? Seizure. Summary  An indomethacin-responsive headache is a headache that gets better when you take indomethacin, a medicine  that stops inflammation.  The exact cause of this condition is not known, but there are certain conditions that may start (trigger) a headache.  Keep a headache diary to help your health care provider determine your triggers.  Treatment of this condition includes indomethacin, but it may include other medicines to relieve other symptoms. This information is not intended to replace advice given to you by your health care provider. Make sure you discuss any questions you have with your health care provider. Document Released: 02/02/2009 Document Revised: 02/25/2017 Document Reviewed: 02/25/2017 Elsevier Interactive Patient Education  2019 [employer].

## 2018-05-23 LAB — CBC WITH DIFFERENTIAL/PLATELET
ABSOLUTE MONOCYTES: 338 {cells}/uL (ref 200–950)
BASOS ABS: 22 {cells}/uL (ref 0–200)
Basophils Relative: 0.6 %
EOS ABS: 29 {cells}/uL (ref 15–500)
Eosinophils Relative: 0.8 %
HEMATOCRIT: 44.7 % (ref 38.5–50.0)
Hemoglobin: 15.1 g/dL (ref 13.2–17.1)
LYMPHS ABS: 1195 {cells}/uL (ref 850–3900)
MCH: 31.3 pg (ref 27.0–33.0)
MCHC: 33.8 g/dL (ref 32.0–36.0)
MCV: 92.7 fL (ref 80.0–100.0)
MPV: 9.8 fL (ref 7.5–12.5)
Monocytes Relative: 9.4 %
NEUTROS PCT: 56 %
Neutro Abs: 2016 cells/uL (ref 1500–7800)
Platelets: 263 10*3/uL (ref 140–400)
RBC: 4.82 10*6/uL (ref 4.20–5.80)
RDW: 13 % (ref 11.0–15.0)
Total Lymphocyte: 33.2 %
WBC: 3.6 10*3/uL — ABNORMAL LOW (ref 3.8–10.8)

## 2018-05-23 LAB — HEMOGLOBIN A1C
EAG (MMOL/L): 5 (calc)
HEMOGLOBIN A1C: 4.8 %{Hb} (ref <5.7)
Mean Plasma Glucose: 91 (calc)

## 2018-05-23 LAB — COMPLETE METABOLIC PANEL WITH GFR
AG Ratio: 1.5 (calc) (ref 1.0–2.5)
ALBUMIN MSPROF: 4.1 g/dL (ref 3.6–5.1)
ALKALINE PHOSPHATASE (APISO): 54 U/L (ref 35–144)
ALT: 20 U/L (ref 9–46)
AST: 21 U/L (ref 10–35)
BILIRUBIN TOTAL: 0.8 mg/dL (ref 0.2–1.2)
BUN: 10 mg/dL (ref 7–25)
CHLORIDE: 105 mmol/L (ref 98–110)
CO2: 28 mmol/L (ref 20–32)
Calcium: 9.4 mg/dL (ref 8.6–10.3)
Creat: 1.22 mg/dL (ref 0.70–1.25)
GFR, EST AFRICAN AMERICAN: 73 mL/min/{1.73_m2} (ref >=60)
GFR, Est Non African American: 63 mL/min/{1.73_m2} (ref >=60)
Globulin: 2.7 g/dL (calc) (ref 1.9–3.7)
Glucose, Bld: 101 mg/dL — ABNORMAL HIGH (ref 65–99)
Potassium: 4.5 mmol/L (ref 3.5–5.3)
SODIUM: 140 mmol/L (ref 135–146)
TOTAL PROTEIN: 6.8 g/dL (ref 6.1–8.1)

## 2018-05-23 LAB — LIPID PANEL
CHOL/HDL RATIO: 2.2 (calc) (ref <5.0)
Cholesterol: 185 mg/dL (ref <200)
HDL: 83 mg/dL (ref >=40)
LDL Cholesterol (Calc): 88 mg/dL (calc)
Non-HDL Cholesterol (Calc): 102 mg/dL (calc) (ref <130)
Triglycerides: 47 mg/dL (ref <150)

## 2018-05-23 LAB — PROLACTIN: Prolactin: 5.7 ng/mL (ref 2.0–18.0)

## 2018-05-23 LAB — SEDIMENTATION RATE: Sed Rate: 6 mm/h (ref 0–20)

## 2018-05-23 LAB — C-REACTIVE PROTEIN: CRP: 1.4 mg/L (ref <8.0)

## 2018-06-25 ENCOUNTER — Ambulatory Visit: Payer: Self-pay | Admitting: Family Medicine

## 2018-06-25 NOTE — Telephone Encounter (Signed)
Pt is schedule for a virtual appointment for 06/27/2018.

## 2018-06-25 NOTE — Telephone Encounter (Signed)
Pt called in c/o having a rash from his BP medication (HCTZ 12.5 mg) that is itching.  I warm transferred the pt to Cassandra at Dr. Renard Hamper office to be scheduled for a virtual visit due to the COVID-19 pandemic.  I sent my notes to the office.   Reason for Disposition . Caller has NON-URGENT medication question about med that PCP prescribed and triager unable to answer question  Answer Assessment - Initial Assessment Questions 1. SYMPTOMS: "Do you have any symptoms?"     I was prescribed a BP medicine 1 year 1/2 ago.  I had black spots that came up on various places.    I went to another hospital and I have been off the BP medication for 2 months but I have resumed it.   Now I have a rash again.    I need my BP medication changed.    2. SEVERITY: If symptoms are present, ask "Are they mild, moderate or severe?"     Rash dark spots under both arms. down my legs and on my side.  The rash itches all day.  Protocols used: MEDICATION QUESTION CALL-A-AH

## 2018-06-27 ENCOUNTER — Ambulatory Visit (INDEPENDENT_AMBULATORY_CARE_PROVIDER_SITE_OTHER): Payer: BLUE CROSS/BLUE SHIELD | Admitting: Family Medicine

## 2018-06-27 ENCOUNTER — Encounter: Payer: Self-pay | Admitting: Family Medicine

## 2018-06-27 ENCOUNTER — Other Ambulatory Visit: Payer: Self-pay

## 2018-06-27 VITALS — BP 146/88 | HR 79

## 2018-06-27 DIAGNOSIS — I10 Essential (primary) hypertension: Secondary | ICD-10-CM | POA: Diagnosis not present

## 2018-06-27 DIAGNOSIS — R21 Rash and other nonspecific skin eruption: Secondary | ICD-10-CM

## 2018-06-27 MED ORDER — LOSARTAN POTASSIUM 50 MG PO TABS
50.0000 mg | ORAL_TABLET | Freq: Every day | ORAL | 0 refills | Status: DC
Start: 2018-06-27 — End: 2018-08-27

## 2018-06-27 MED ORDER — TRIAMCINOLONE ACETONIDE 0.1 % EX CREA: 1.0000 "application " | TOPICAL_CREAM | Freq: Two times a day (BID) | CUTANEOUS | 0 refills | Status: DC

## 2018-06-27 NOTE — Progress Notes (Signed)
Name: Kristopher Davis   MRN: [id number]    DOB: August 09, 1955   Date:06/27/2018       Progress Note  Subjective  Chief Complaint  Chief Complaint  Patient presents with  . Rash    Thinks rash is caused by bp medication recently. Evaulated at [facility] dermatologist. He has been off bp medication for about 6-8 months. Evaulated by PCP and started back on the same medication. Rash came back.    I connected with  Kristopher Davis  on 06/27/18 at 10:40 AM EDT by a video enabled telemedicine application and verified that I am speaking with the correct person using two identifiers.  I discussed the limitations of evaluation and management by telemedicine and the availability of in person appointments. The patient expressed understanding and agreed to proceed. Staff also discussed with the patient that there may be a patient responsible charge related to this service. Patient Location: at home  Provider Location: [facility] Other: wife   HPI  Rash: possible drug reaction, he states he was taking HCTZ last year and had similar rash, raised and dark like a bruise, no oozing or pilling, on upper arms and legs, not associated with SOB or wheezing. He stopped medication and symptoms resolved, however resumed taking it 05/22/2018 and rash it back again. We will stop HCTZ and change bp therapy. He took last pill one week ago , bp today slightly elevated. Rash has not changed since. He has tried topical medication given by dermatologist that he saw last year, on Dr. Sheliah Davis note he thought psoriasis that got worse with terbinafine    HTN: he denies chest pain or palpitation, bp at home today was slightly high, we will change from hctz to losartan, but explained Dr. Marolyn Davis felt it was psoriasis and reaction to terbinafine not HCTZ.   Patient Active Problem List   Diagnosis Date Noted  . Aortic atherosclerosis ([place]) 08/24/2017  . Pneumonia 08/23/2017  . Elevated AST (SGOT) 07/14/2015  .  Prostate cancer screening 07/09/2015  . Medication monitoring encounter 07/09/2015  . Need for hepatitis C screening test 07/09/2015  . Tinea 08/11/2014  . 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 Disease of hair and hair follicles 76/72/0947  . Benign essential HTN 01/07/2009  . Fatigue 01/18/2008  . Decreased libido 02/01/2007  . CD (contact dermatitis) 10/10/2006    Past Surgical History:  Procedure Laterality Date  . LUMBAR LAMINECTOMY      Family History  Problem Relation Age of Onset  . Cancer Mother   . Anemia Daughter     Social History   Socioeconomic History  . Marital status: Married    Spouse name: Not on file  . Number of children: Not on file  . Years of education: Not on file  . Highest education level: Not on file  Occupational History  . Not on file  Social Needs  . Financial resource strain: Not hard at all  . Food insecurity:    Worry: Never true    Inability: Never true  . Transportation needs:    Medical: No    Non-medical: No  Tobacco Use  . Smoking status: Former Smoker    Packs/day: 0.25    Years: 12.00    Pack years: 3.00    Types: Cigarettes    Start date: 08/28/2017  . Smokeless tobacco: Former Systems developer    Quit date: 08/21/2017  Substance and Sexual Activity  . Alcohol use: No    Alcohol/week: 0.0 standard drinks  .  Drug use: No  . Sexual activity: Yes    Partners: Female  Lifestyle  . Physical activity:    Days per week: 1 day    Minutes per session: 60 min  . Stress: Not at all  Relationships  . Social connections:    Talks on phone: Not on file    Gets together: Not on file    Attends religious service: Not on file    Active member of club or organization: Not on file    Attends meetings of clubs or organizations: Not on file    Relationship status: Not on file  . Intimate partner violence:    Fear of current or ex partner: No    Emotionally abused: No    Physically abused: No    Forced sexual activity: No  Other Topics Concern  . Not on  file  Social History Narrative  . Not on file     Current Outpatient Medications:  .  acetaminophen (TYLENOL) 500 MG tablet, Take 500 mg by mouth every 6 (six) hours as needed for moderate pain or headache., Disp: , Rfl:  .  aspirin EC 81 MG tablet, Take 1 tablet (81 mg total) by mouth daily., Disp: 30 tablet, Rfl: 11 .  indomethacin (INDOCIN) 50 MG capsule, Take 1 capsule (50 mg total) by mouth 3 (three) times daily with meals. (Patient not taking: Reported on 06/27/2018), Disp: 30 capsule, Rfl: 0 .  losartan (COZAAR) 50 MG tablet, Take 1 tablet (50 mg total) by mouth daily., Disp: 30 tablet, Rfl: 0 .  triamcinolone cream (KENALOG) 0.1 %, Apply 1 application topically 2 (two) times daily., Disp: 60 g, Rfl: 0  Allergies  Allergen Reactions  . Levofloxacin In D5w     hallucinations    I personally reviewed active problem list, medication list, allergies, family history with the patient/caregiver today.   ROS  Ten systems reviewed and is negative except as mentioned in HPI    Objective  Virtual encounter, vitals signs done at home   There is no height or weight on file to calculate BMI.  Physical Exam   Awake , alert and oriented Hyperpigmented rash on upper arms, no oozing, different sizes and shapes   PHQ2/9: Depression screen [facility] 2/9 06/27/2018 05/22/2018 01/17/2018 11/04/2016 04/12/2016  Decreased Interest 0 0 0 0 0  Down, Depressed, Hopeless 0 0 0 0 0  PHQ - 2 Score 0 0 0 0 0  Altered sleeping 0 0 0 - -  Tired, decreased energy 0 0 0 - -  Change in appetite 0 0 0 - -  Feeling bad or failure about yourself  0 0 0 - -  Trouble concentrating 0 0 0 - -  Moving slowly or fidgety/restless 0 0 0 - -  Suicidal thoughts 0 0 0 - -  PHQ-9 Score 0 0 0 - -  Difficult doing work/chores - Not difficult at all Not difficult at all - -   PHQ-2/9 Result is negative.    Fall Risk: Fall Risk  06/27/2018 05/22/2018 01/17/2018 08/11/2017 11/04/2016  Falls in the past year? 0 0 0 No No   Number falls in past yr: 0 0 - - -  Injury with Fall? 0 0 - - -     Assessment & Plan  1. Benign essential HTN  - losartan (COZAAR) 50 MG tablet; Take 1 tablet (50 mg total) by mouth daily.  Dispense: 30 tablet; Refill: 0 Discussed possible side effects of mediation, including  angioedema and cough, although less likely than ACE  2. Rash  - triamcinolone cream (KENALOG) 0.1 %; Apply 1 application topically 2 (two) times daily.  Dispense: 60 g; Refill: 0  I discussed the assessment and treatment plan with the patient. The patient was provided an opportunity to ask questions and all were answered. The patient agreed with the plan and demonstrated an understanding of the instructions.  The patient was advised to call back or seek an in-person evaluation if the symptoms worsen or if the condition fails to improve as anticipated.  I provided 25 minutes of non-face-to-face time during this encounter.

## 2018-07-07 IMAGING — CT CT ABD-PELV W/ CM
3 of 5 series · 14 of 36 positions shown, 17 images · IV contrast (iopamidol)
Comparison: None.

CLINICAL DATA: Pronounced weight loss over the last several weeks.
Fever.

EXAM:
CT CHEST, ABDOMEN, AND PELVIS WITH CONTRAST
TECHNIQUE: Multidetector CT imaging of the chest, abdomen and pelvis was
performed following the standard protocol during bolus
administration of intravenous contrast.
CONTRAST:  100mL 1PTM78-NWW IOPAMIDOL (1PTM78-NWW) INJECTION 61%

[Series 2: cap with · axial · 0.79mm/px · z∈[-657,-97]mm · 9 of 142 slices shown, 12 images]
[im 15/142  mediastinal]
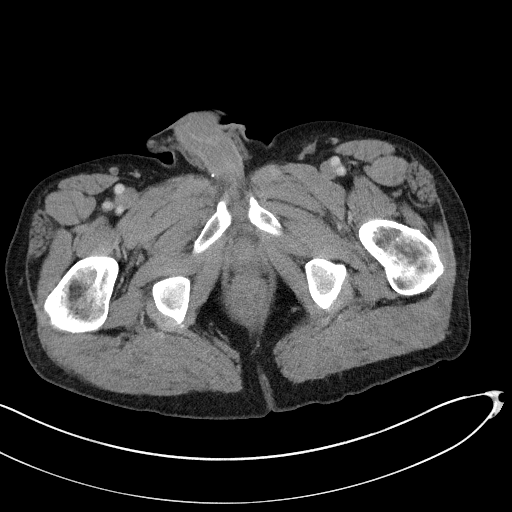
[im 15/142  lung]
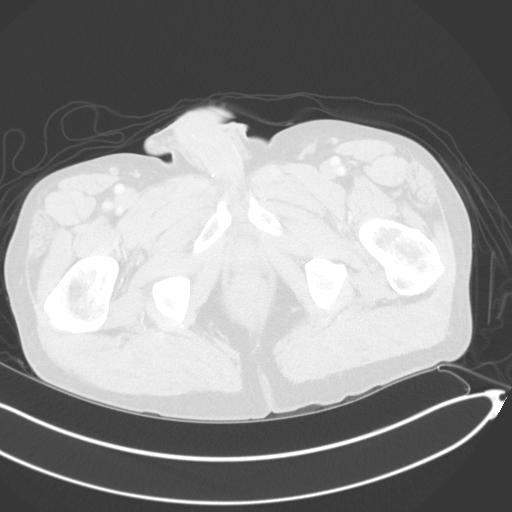
[im 29/142  lung]
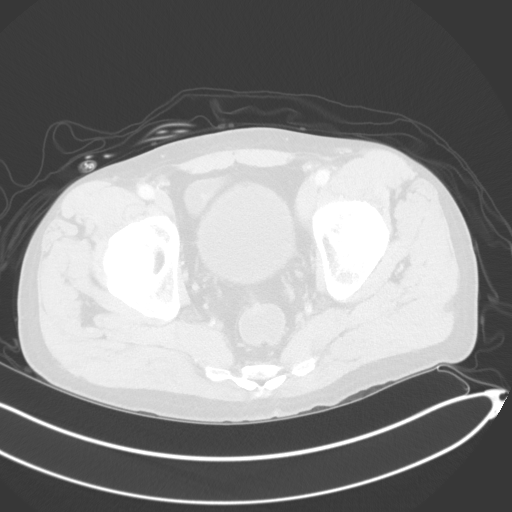
[im 43/142  lung]
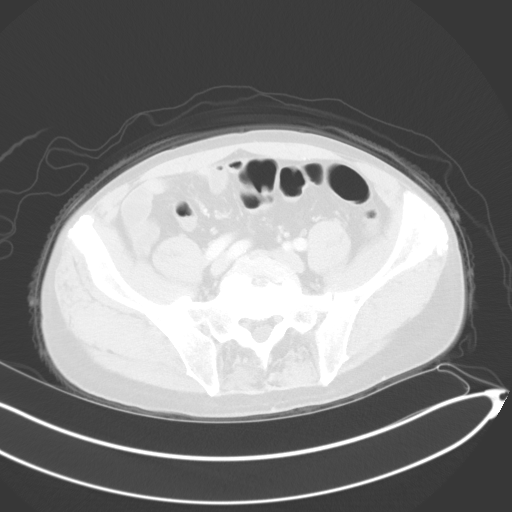
[im 57/142  lung]
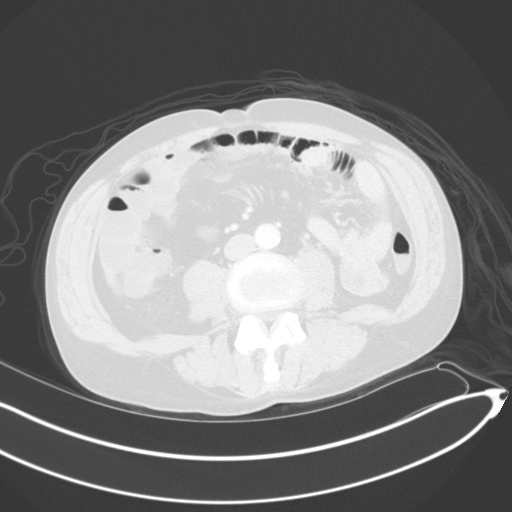
[im 71/142  mediastinal]
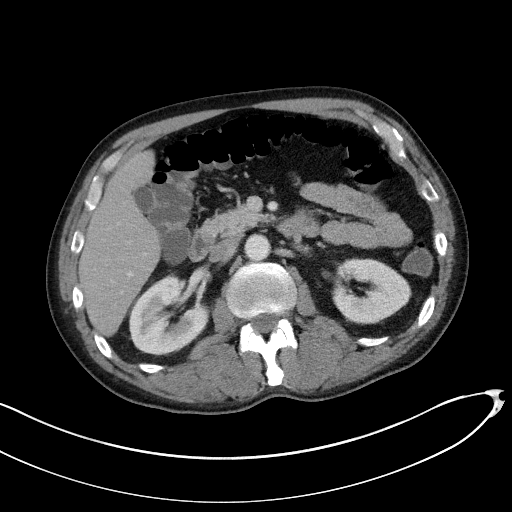
[im 71/142  lung]
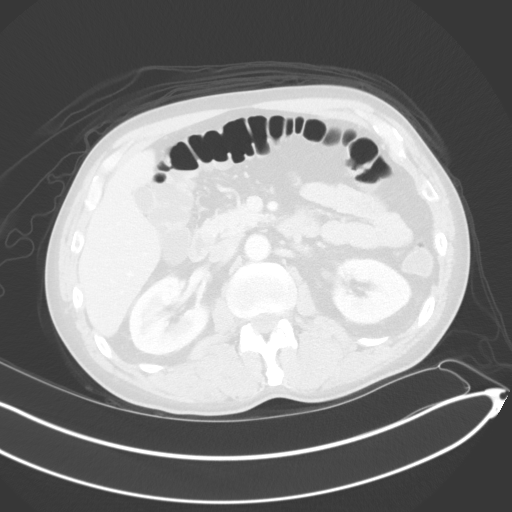
[im 85/142  lung]
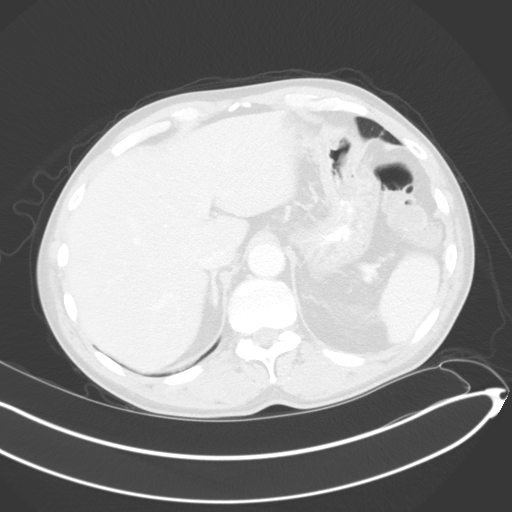
[im 99/142  lung]
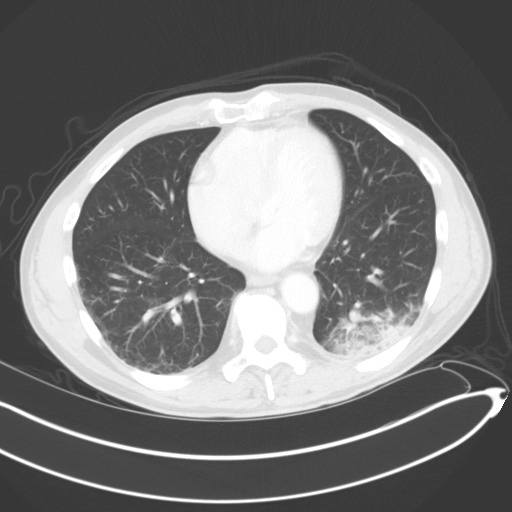
[im 113/142  lung]
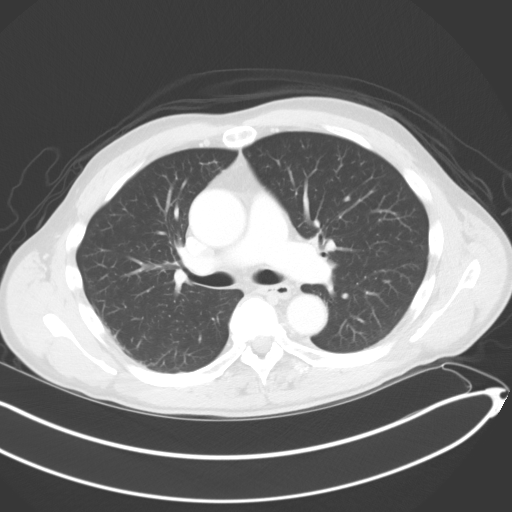
[im 127/142  mediastinal]
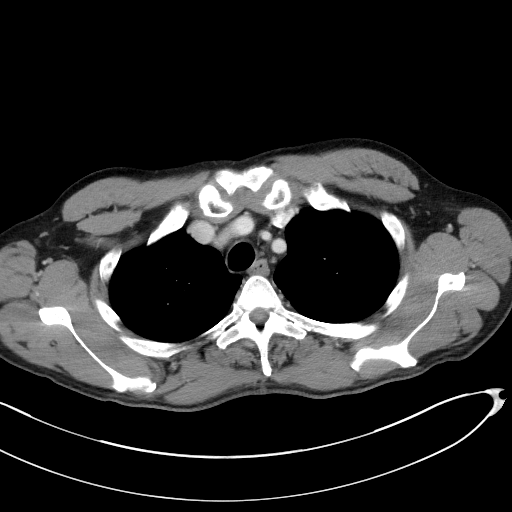
[im 127/142  lung]
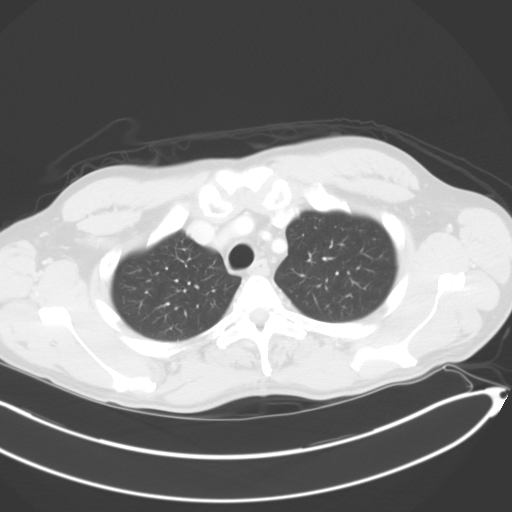

[Series 4: lung · axial · 0.79mm/px · z∈[-334,-282]mm · 2 of 170 slices shown]
[im 14/170  lung]
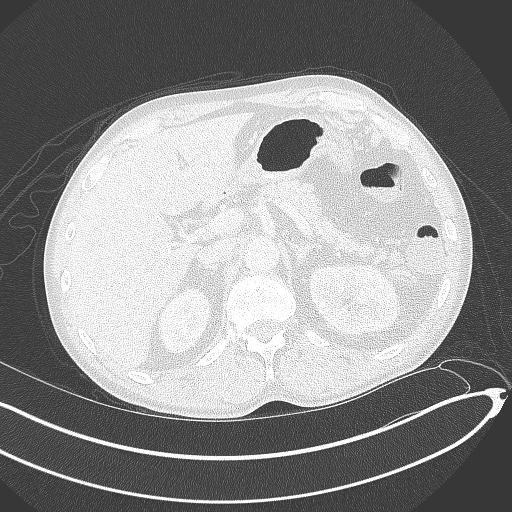
[im 40/170  lung]
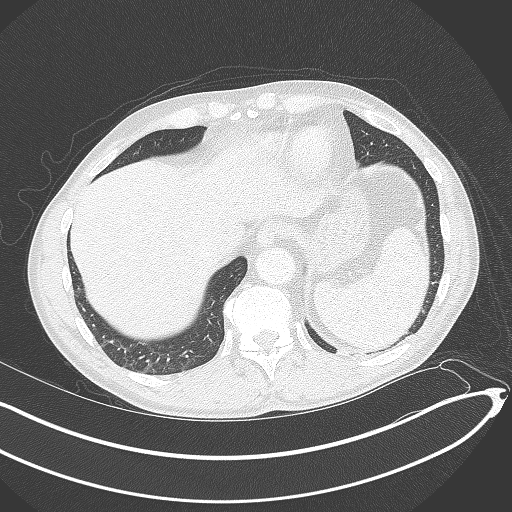

[Series 5: coronals · coronal · 0.84mm/px · 3 of 130 slices shown]
[im 26/130  lung]
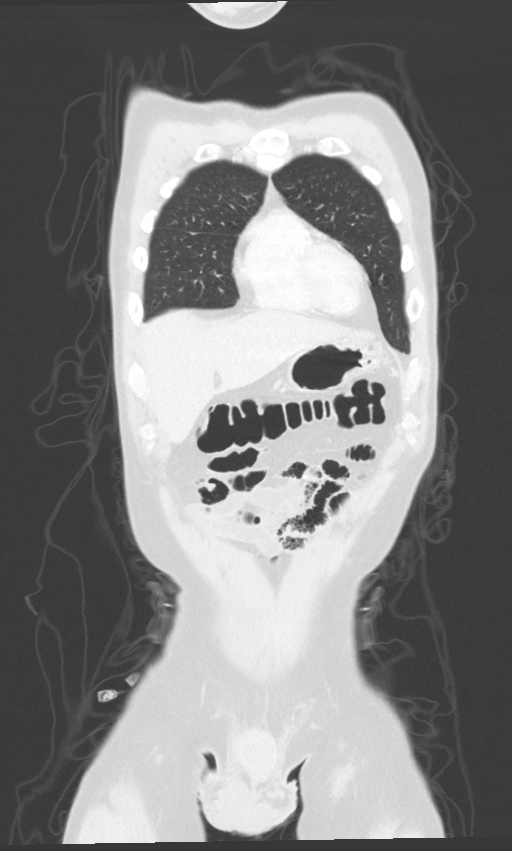
[im 52/130  lung]
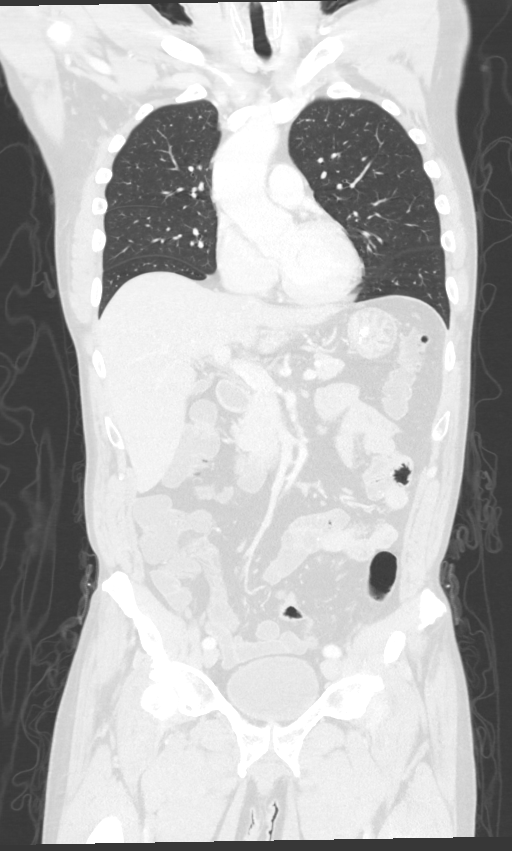
[im 78/130  lung]
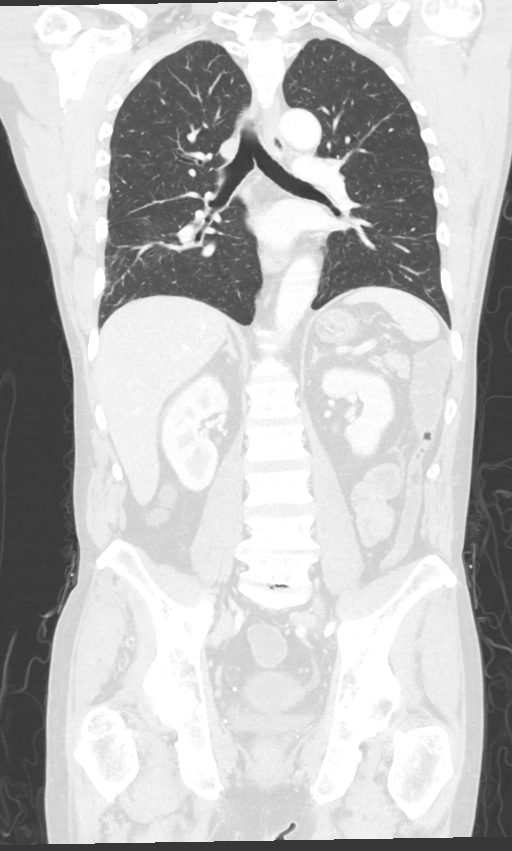

[14 of 36 positions shown; findings below may reference images not displayed]

FINDINGS: CT CHEST FINDINGS

Cardiovascular: Heart size is normal. Fleck like coronary artery
calcification is seen, without advanced finding. No pericardial
fluid. No thoracic aortic atherosclerosis. Pulmonary arteries as
seen appear normal.

Mediastinum/Nodes: No mediastinal or hilar mass or lymphadenopathy.

Lungs/Pleura: There is bilateral bronchopneumonia, most pronounced
in the posterior left lower lobe, but with small areas of patchy
involvement also in the right lower lobe. No dense consolidation or
lobar collapse. No pleural effusion.

Musculoskeletal: Negative

CT ABDOMEN PELVIS FINDINGS

Hepatobiliary: Liver parenchyma is normal. Insignificant
subcentimeter cyst at the caudal tip of the right lobe. No calcified
gallstones.

Pancreas: Normal

Spleen: Normal

Adrenals/Urinary Tract: Adrenal glands are normal. Kidneys are
normal. No cyst, mass, stone or hydronephrosis. Bladder is normal.

Stomach/Bowel: Liquid stool is noted. There is no evidence of
obstruction or focal lesion however. The appendix is normal.

Vascular/Lymphatic: Aortic atherosclerosis. No aneurysm. IVC is
normal. No retroperitoneal adenopathy.

Reproductive: Normal

Other: No free fluid or air.

Musculoskeletal: Ordinary lower lumbar degenerative changes.
IMPRESSION: Left lower lobe pneumonia. Minimal, patchy involvement also in the
right lower lobe. No dense consolidation or lobar collapse. No
pleural effusion.

Liquid stool, consistent with the clinical history of diarrhea. No
specific abdominal finding of acute significance.

Aortic atherosclerosis.

## 2018-08-05 IMAGING — CR DG CHEST 2V
1 series · 2 of 2 positions shown · non-contrast
Comparison: 08/23/2017

CLINICAL DATA: Pneumonia

EXAM:
CHEST - 2 VIEW

[Series 1: dg chest 2 view · 0.14mm/px · 2 of 2 slices shown]
[im 1/2]
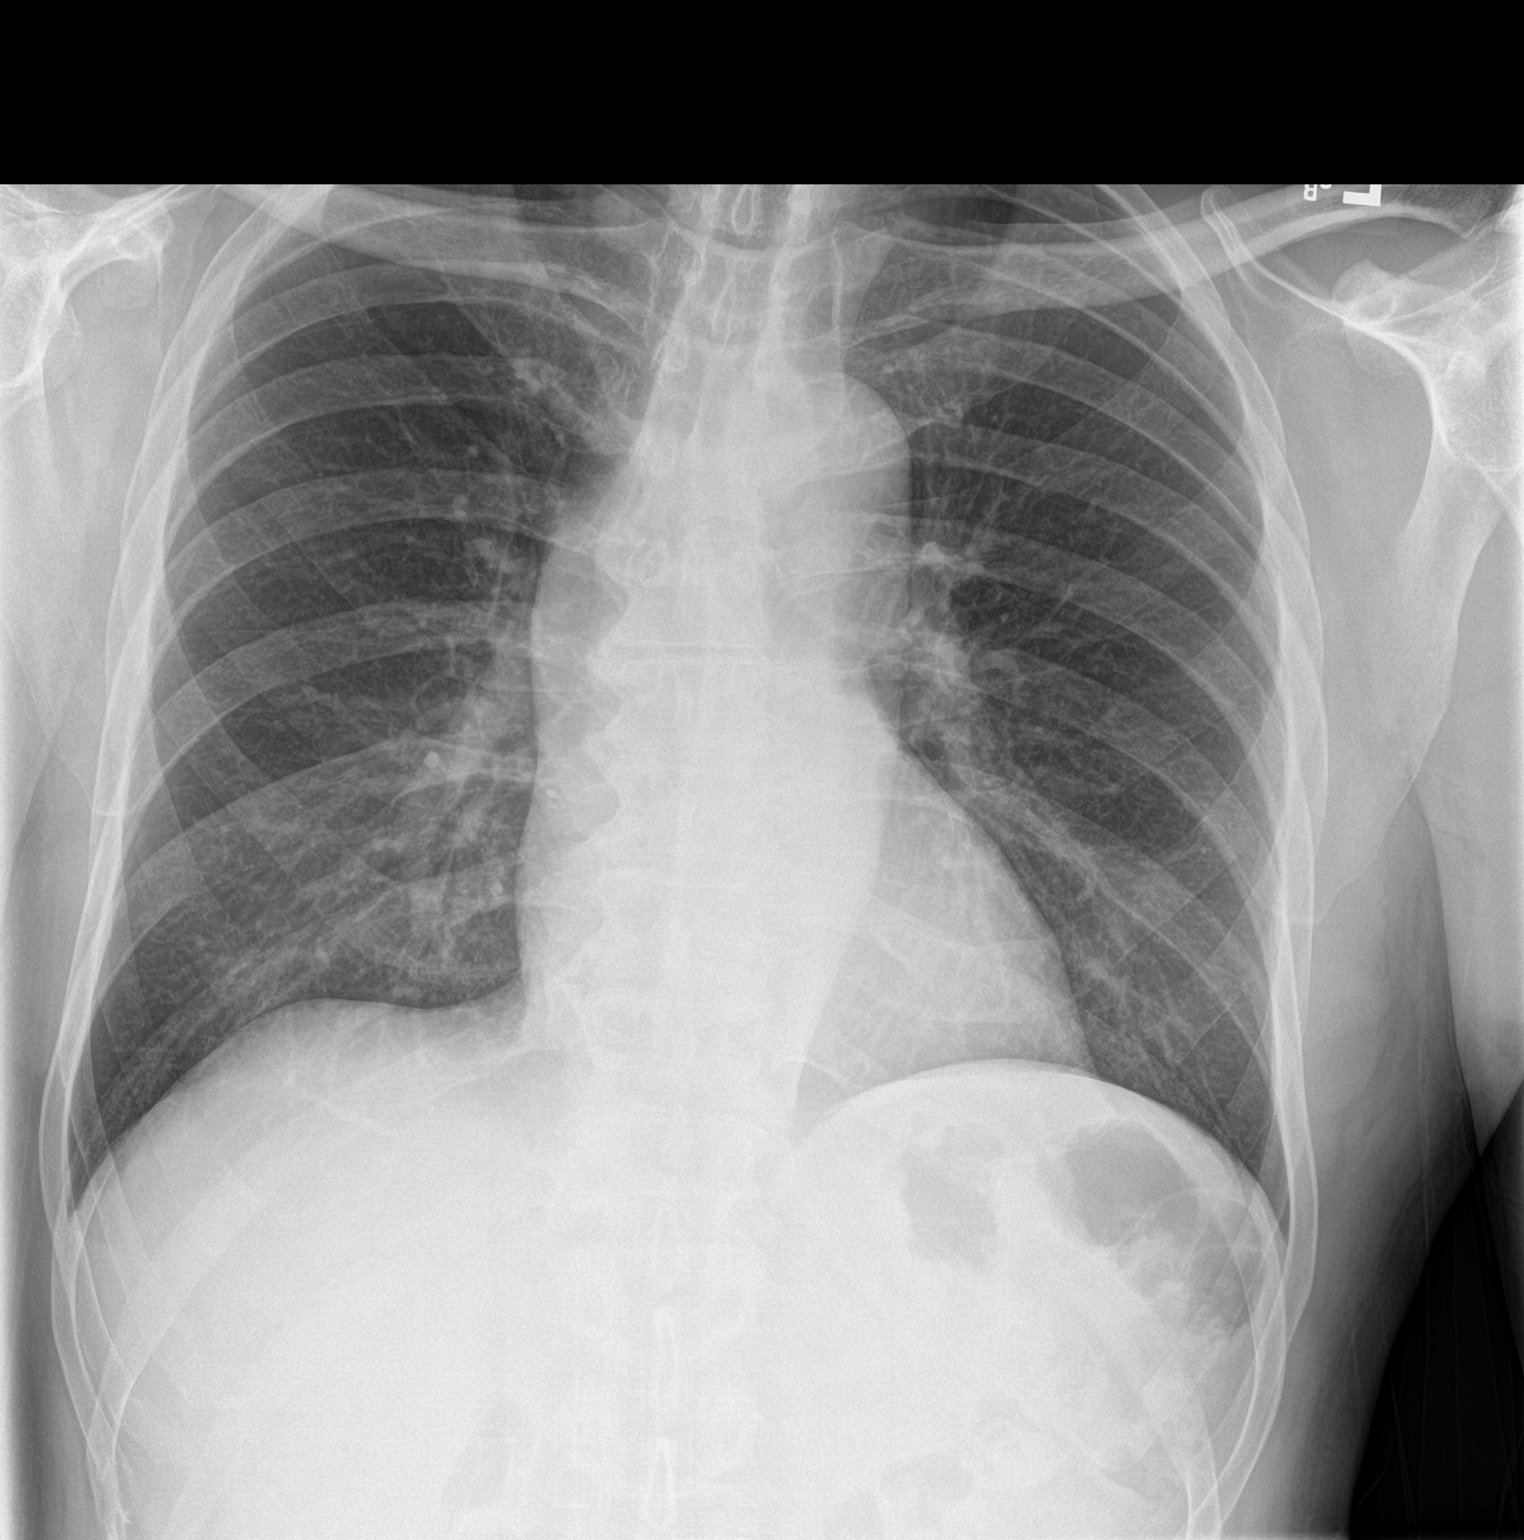
[im 2/2]
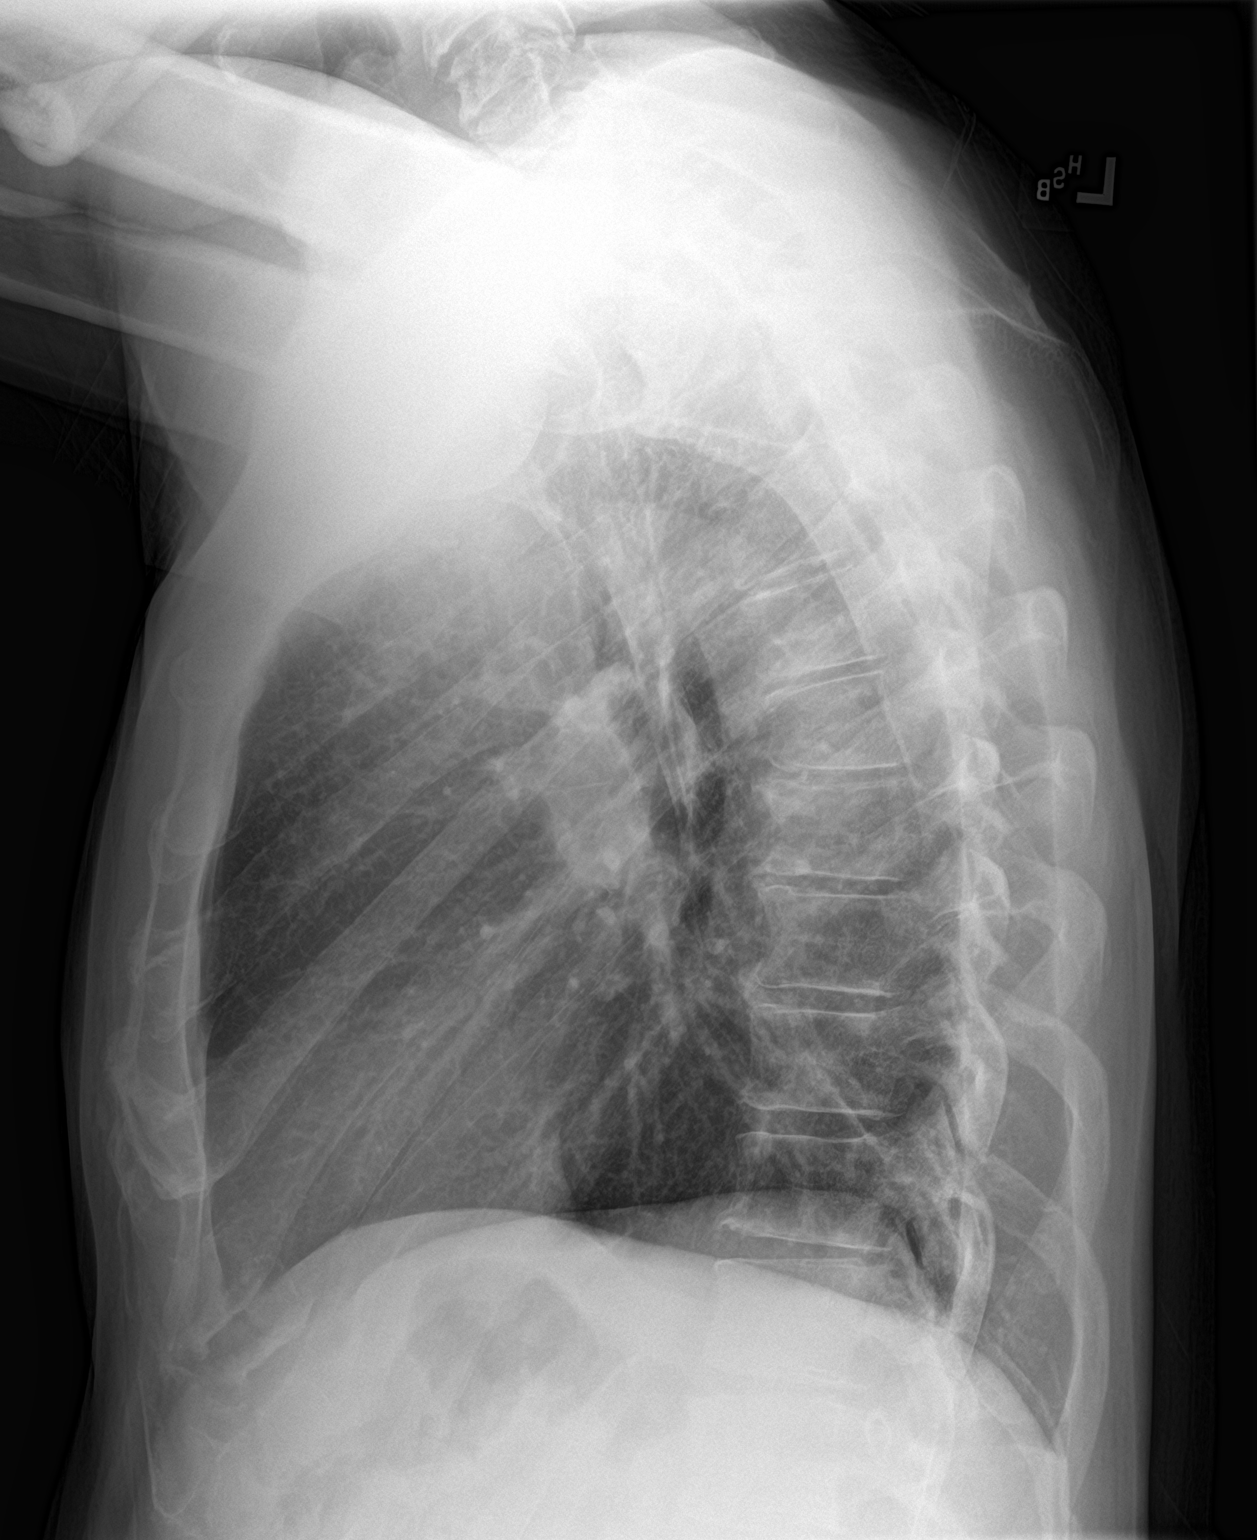

[2 of 2 positions shown; findings below may reference images not displayed]

FINDINGS: Patchy airspace opacities at the left base have improved. Right lung
is clear. No pneumothorax or pleural effusion. Normal heart size.
IMPRESSION: Improved left lower lobe pneumonia.

## 2018-08-27 ENCOUNTER — Other Ambulatory Visit: Payer: Self-pay | Admitting: Family Medicine

## 2018-08-27 DIAGNOSIS — I10 Essential (primary) hypertension: Secondary | ICD-10-CM

## 2018-09-25 ENCOUNTER — Other Ambulatory Visit: Payer: Self-pay

## 2018-09-25 ENCOUNTER — Encounter: Payer: Self-pay | Admitting: Family Medicine

## 2018-09-25 ENCOUNTER — Encounter: Payer: BLUE CROSS/BLUE SHIELD | Admitting: Family Medicine

## 2018-09-26 NOTE — Progress Notes (Signed)
Patient left upset. He was being triaged and when told by CMA Vonna Kotyk he was here for a CPE, he stated he did not need a CPE and left, I never had the opportunity to see him

## 2018-09-30 ENCOUNTER — Other Ambulatory Visit: Payer: Self-pay | Admitting: Family Medicine

## 2018-09-30 DIAGNOSIS — I10 Essential (primary) hypertension: Secondary | ICD-10-CM

## 2018-10-25 ENCOUNTER — Other Ambulatory Visit: Payer: Self-pay | Admitting: Family Medicine

## 2018-10-25 DIAGNOSIS — I10 Essential (primary) hypertension: Secondary | ICD-10-CM

## 2018-10-25 NOTE — Telephone Encounter (Signed)
Requested medication (s) are due for refill today: yes  Requested medication (s) are on the active medication list: yes  Last refill:  08/27/2018  Future visit scheduled: no  Notes to clinic:  Review for refill  Requested Prescriptions  Pending Prescriptions Disp Refills   losartan (COZAAR) 50 MG tablet [Pharmacy Med Name: LOSARTAN 50MG  TABLETS] 30 tablet 0    Sig: TAKE 1 TABLET(50 MG) BY MOUTH DAILY     Cardiovascular:  Angiotensin Receptor Blockers Failed - 10/25/2018  9:09 AM      Failed - Last BP in normal range    BP Readings from Last 1 Encounters:  09/25/18 (!) 146/82         Passed - Cr in normal range and within 180 days    Creat  Date Value Ref Range Status  05/22/2018 1.22 0.70 - 1.25 mg/dL Final    Comment:    For patients >33 years of age, the reference limit for Creatinine is approximately 13% higher for people identified as African-American. .          Passed - K in normal range and within 180 days    Potassium  Date Value Ref Range Status  05/22/2018 4.5 3.5 - 5.3 mmol/L Final         Passed - Patient is not pregnant      Passed - Valid encounter within last 6 months    Recent Outpatient Visits          4 months ago Benign essential HTN   [facility] [place], Drue Stager, MD   5 months ago Episodic paroxysmal hemicrania, not intractable   [facility] Steele Sizer, MD   9 months ago Abscess of back   [facility] [place], [place]   1 year ago Hospital discharge follow-up   [facility] Steele Sizer, MD   1 year ago Fever, unspecified fever cause   [place], [place], NP

## 2018-10-26 NOTE — Telephone Encounter (Signed)
Pt scheduled appt for your first late afternoon appt sept 14, 2020

## 2018-11-12 ENCOUNTER — Other Ambulatory Visit: Payer: Self-pay

## 2018-11-12 ENCOUNTER — Ambulatory Visit (INDEPENDENT_AMBULATORY_CARE_PROVIDER_SITE_OTHER): Payer: BC Managed Care – PPO | Admitting: Family Medicine

## 2018-11-12 ENCOUNTER — Encounter: Payer: Self-pay | Admitting: Family Medicine

## 2018-11-12 VITALS — BP 132/88 | HR 82 | Temp 97.1°F | Resp 16 | Ht 76.5 in | Wt 219.4 lb

## 2018-11-12 DIAGNOSIS — N529 Male erectile dysfunction, unspecified: Secondary | ICD-10-CM | POA: Diagnosis not present

## 2018-11-12 DIAGNOSIS — I10 Essential (primary) hypertension: Secondary | ICD-10-CM | POA: Diagnosis not present

## 2018-11-12 DIAGNOSIS — I700 Atherosclerosis of aorta: Secondary | ICD-10-CM | POA: Diagnosis not present

## 2018-11-12 MED ORDER — SILDENAFIL CITRATE 100 MG PO TABS
50.0000 mg | ORAL_TABLET | Freq: Every day | ORAL | 1 refills | Status: DC | PRN
Start: 2018-11-12 — End: 2020-04-12

## 2018-11-12 MED ORDER — LOSARTAN POTASSIUM 50 MG PO TABS: ORAL_TABLET | ORAL | 1 refills | Status: DC

## 2018-11-12 NOTE — Progress Notes (Signed)
Name: Ozan Mastro   MRN: [id number]    DOB: 1955-06-02   Date:11/12/2018       Progress Note  Subjective  Chief Complaint  Chief Complaint  Patient presents with  . Hypertension  . Medication Refill    HPI  HTN: he denies chest pain or palpitation, bp at home has been well controlled in the 130's/70's, today a little higher, but he does not want to adjust medication.   Atherosclerosis of aorta: advised statin therapy , but he wants to continue aspirin, last LDL was below 100  ED: he took viagra in the past, he has noticed difficulty maintaining and erection and would like a refill of Viagra . Discussed importance of letting EMS know if he took viagra because of risk of hypotension  Patient Active Problem List   Diagnosis Date Noted  . Aortic atherosclerosis ([place]) 08/24/2017  . Pneumonia 08/23/2017  . Elevated AST (SGOT) 07/14/2015  . Prostate cancer screening 07/09/2015  . Medication monitoring encounter 07/09/2015  . Need for hepatitis C screening test 07/09/2015  . Tinea 08/11/2014  . 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 Disease of hair and hair follicles 99991111  . Benign essential HTN 01/07/2009  . Fatigue 01/18/2008  . Decreased libido 02/01/2007  . CD (contact dermatitis) 10/10/2006    Past Surgical History:  Procedure Laterality Date  . LUMBAR LAMINECTOMY     3 surgeries    Family History  Problem Relation Age of Onset  . Cancer Mother   . Hypertension Sister   . Anemia Daughter     Social History   Socioeconomic History  . Marital status: Married    Spouse name: Not on file  . Number of children: 2  . Years of education: Not on file  . Highest education level: Not on file  Occupational History  . Occupation: verifies orders / inspection of products     Comment: makes Data processing manager   Social Needs  . Financial resource strain: Not hard at all  . Food insecurity    Worry: Never true    Inability: Never true  . Transportation needs    Medical: No     Non-medical: No  Tobacco Use  . Smoking status: Current Every Day Smoker    Packs/day: 0.25    Years: 12.00    Pack years: 3.00    Types: Cigarettes    Start date: 08/28/2017  . Smokeless tobacco: Former Systems developer    Quit date: 08/21/2017  . Tobacco comment: but states cutting down  Substance and Sexual Activity  . Alcohol use: Yes    Alcohol/week: 0.0 standard drinks    Comment: on the weekends  . Drug use: No  . Sexual activity: Yes    Partners: Female    Comment: married  Lifestyle  . Physical activity    Days per week: 0 days    Minutes per session: 0 min  . Stress: Not at all  Relationships  . Social connections    Talks on phone: More than three times a week    Gets together: More than three times a week    Attends religious service: More than 4 times per year    Active member of club or organization: Yes    Attends meetings of clubs or organizations: More than 4 times per year    Relationship status: Married  . Intimate partner violence    Fear of current or ex partner: No    Emotionally abused: No    Physically  abused: No    Forced sexual activity: No  Other Topics Concern  . Not on file  Social History Narrative   Works full time, not ready to quit smoking     Current Outpatient Medications:  .  aspirin EC 81 MG tablet, Take 1 tablet (81 mg total) by mouth daily., Disp: 30 tablet, Rfl: 11 .  losartan (COZAAR) 50 MG tablet, TAKE 1 TABLET(50 MG) BY MOUTH DAILY, Disp: 30 tablet, Rfl: 0 .  acetaminophen (TYLENOL) 500 MG tablet, Take 500 mg by mouth every 6 (six) hours as needed for moderate pain or headache., Disp: , Rfl:  .  indomethacin (INDOCIN) 50 MG capsule, Take 1 capsule (50 mg total) by mouth 3 (three) times daily with meals. (Patient not taking: Reported on 11/12/2018), Disp: 30 capsule, Rfl: 0 .  triamcinolone cream (KENALOG) 0.1 %, Apply 1 application topically 2 (two) times daily. (Patient not taking: Reported on 11/12/2018), Disp: 60 g, Rfl: 0  Allergies   Allergen Reactions  . Levofloxacin In D5w     hallucinations    I personally reviewed active problem list, medication list, allergies, family history, social history, health maintenance with the patient/caregiver today.   ROS  Constitutional: Negative for fever or weight change.  Respiratory: Negative for cough and shortness of breath.   Cardiovascular: Negative for chest pain or palpitations.  Gastrointestinal: Negative for abdominal pain, no bowel changes.  Musculoskeletal: Negative for gait problem or joint swelling.  Skin: Negative for rash.  Neurological: Negative for dizziness or headache.  No other specific complaints in a complete review of systems (except as listed in HPI above).  Objective  Vitals:   11/12/18 1529  BP: 130/90  Pulse: 82  Resp: 16  Temp: (!) 97.1 F (36.2 C)  TempSrc: Oral  SpO2: 99%  Weight: 219 lb 6.4 oz (99.5 kg)  Height: 6' 4.5" (1.943 m)    Body mass index is 26.36 kg/m.  Physical Exam  Constitutional: Patient appears well-developed and well-nourished. Overweight. No distress.  HEENT: head atraumatic, normocephalic, pupils equal and reactive to light Cardiovascular: Normal rate, regular rhythm and normal heart sounds.  No murmur heard. No BLE edema. Pulmonary/Chest: Effort normal and breath sounds normal. No respiratory distress. Abdominal: Soft.  There is no tenderness. Psychiatric: Patient has a normal mood and affect. behavior is normal. Judgment and thought content normal.  PHQ2/9: Depression screen [facility] 2/9 11/12/2018 09/25/2018 06/27/2018 05/22/2018 01/17/2018  Decreased Interest 0 0 0 0 0  Down, Depressed, Hopeless 0 0 0 0 0  PHQ - 2 Score 0 0 0 0 0  Altered sleeping 0 0 0 0 0  Tired, decreased energy 0 0 0 0 0  Change in appetite 0 0 0 0 0  Feeling bad or failure about yourself  0 0 0 0 0  Trouble concentrating 0 0 0 0 0  Moving slowly or fidgety/restless 0 0 0 0 0  Suicidal thoughts 0 0 0 0 0  PHQ-9 Score 0 0 0 0 0   Difficult doing work/chores - Not difficult at all - Not difficult at all Not difficult at all    phq 9 is negative   Fall Risk: Fall Risk  09/25/2018 06/27/2018 05/22/2018 01/17/2018 08/11/2017  Falls in the past year? 0 0 0 0 No  Number falls in past yr: 0 0 0 - -  Injury with Fall? 0 0 0 - -     Functional Status Survey: Is the patient deaf or have difficulty hearing?:  No Does the patient have difficulty seeing, even when wearing glasses/contacts?: No Does the patient have difficulty concentrating, remembering, or making decisions?: No Does the patient have difficulty walking or climbing stairs?: No Does the patient have difficulty dressing or bathing?: No Does the patient have difficulty doing errands alone such as visiting a doctor's office or shopping?: No    Assessment & Plan   1. Benign essential HTN  - losartan (COZAAR) 50 MG tablet; TAKE 1 TABLET(50 MG) BY MOUTH DAILY  Dispense: 90 tablet; Refill: 1  2. Aortic atherosclerosis ([facility])  Refuses statin, taking aspirin otc  3. Erectile dysfunction, unspecified erectile dysfunction type  - sildenafil (VIAGRA) 100 MG tablet; Take 0.5-1 tablets (50-100 mg total) by mouth daily as needed for erectile dysfunction.  Dispense: 30 tablet; Refill: 1

## 2019-04-11 ENCOUNTER — Ambulatory Visit: Payer: BLUE CROSS/BLUE SHIELD

## 2019-04-13 ENCOUNTER — Ambulatory Visit: Payer: BLUE CROSS/BLUE SHIELD

## 2019-04-19 ENCOUNTER — Encounter: Payer: Self-pay | Admitting: Family Medicine

## 2019-04-19 ENCOUNTER — Ambulatory Visit (INDEPENDENT_AMBULATORY_CARE_PROVIDER_SITE_OTHER): Payer: 59 | Admitting: Family Medicine

## 2019-04-19 ENCOUNTER — Other Ambulatory Visit: Payer: Self-pay

## 2019-04-19 VITALS — BP 128/88 | HR 87 | Temp 97.8°F | Resp 16 | Ht 76.5 in | Wt 221.0 lb

## 2019-04-19 DIAGNOSIS — M25572 Pain in left ankle and joints of left foot: Secondary | ICD-10-CM | POA: Diagnosis not present

## 2019-04-19 DIAGNOSIS — Z131 Encounter for screening for diabetes mellitus: Secondary | ICD-10-CM | POA: Diagnosis not present

## 2019-04-19 DIAGNOSIS — I700 Atherosclerosis of aorta: Secondary | ICD-10-CM

## 2019-04-19 DIAGNOSIS — I10 Essential (primary) hypertension: Secondary | ICD-10-CM

## 2019-04-19 DIAGNOSIS — G44039 Episodic paroxysmal hemicrania, not intractable: Secondary | ICD-10-CM

## 2019-04-19 MED ORDER — INDOMETHACIN 50 MG PO CAPS: 50.0000 mg | ORAL_CAPSULE | Freq: Three times a day (TID) | ORAL | 0 refills | Status: DC

## 2019-04-19 MED ORDER — LOSARTAN POTASSIUM 50 MG PO TABS: ORAL_TABLET | ORAL | 1 refills | Status: DC

## 2019-04-19 NOTE — Progress Notes (Signed)
Name: Kristopher Davis   MRN: [id number]    DOB: 1955/09/19   Date:04/19/2019       Progress Note  Subjective  Chief Complaint  Chief Complaint  Patient presents with  . Ankle Pain    onset Sunday morning    HPI  Acute left ankle pain: he states he has been working long hours , about 10 hours per day, he states that 6 days ago he was off work and noticed pain on left lateral ankle, while resting. The following day he noticed swelling while taking a shower and it was painful , he states on Monday he went to work and was able to wear his boot but towards the end of the day he noticed throbbing sensation, that got progressively worse and this past  Wednesday he could barely bear weight , yesterday he could only walk on the ball of his left foot, he states he has been able to sleep. It is a little warm, but does not seem red. There is not trauma, no previous history of gout. He has missed work since Thursday. He is not taking any NSAID's  HTN: taking Losartan daily and bp is at goal, no chest pain or palpitation  Aortic atherosclerosis:  Reviewed CT chest done at [facility] 07/2017, he still refuses statin therapy, we will recheck labs   Patient Active Problem List   Diagnosis Date Noted  . Aortic atherosclerosis ([place]) 08/24/2017  . Pneumonia 08/23/2017  . Elevated AST (SGOT) 07/14/2015  . Prostate cancer screening 07/09/2015  . Medication monitoring encounter 07/09/2015  . Need for hepatitis C screening test 07/09/2015  . Tinea 08/11/2014  . 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 Disease of hair and hair follicles 99991111  . Benign essential HTN 01/07/2009  . Fatigue 01/18/2008  . Decreased libido 02/01/2007  . CD (contact dermatitis) 10/10/2006    Past Surgical History:  Procedure Laterality Date  . LUMBAR LAMINECTOMY     3 surgeries    Family History  Problem Relation Age of Onset  . Cancer Mother   . Hypertension Sister   . Anemia Daughter     Social History   Tobacco Use  . Smoking status:  Current Every Day Smoker    Packs/day: 0.25    Years: 12.00    Pack years: 3.00    Types: Cigarettes    Start date: 08/28/2017  . Smokeless tobacco: Former Systems developer    Quit date: 08/21/2017  . Tobacco comment: but states cutting down  Substance Use Topics  . Alcohol use: Yes    Alcohol/week: 0.0 standard drinks    Comment: on the weekends  . Drug use: No     Current Outpatient Medications:  .  acetaminophen (TYLENOL) 500 MG tablet, Take 500 mg by mouth every 6 (six) hours as needed for moderate pain or headache., Disp: , Rfl:  .  aspirin EC 81 MG tablet, Take 1 tablet (81 mg total) by mouth daily., Disp: 30 tablet, Rfl: 11 .  indomethacin (INDOCIN) 50 MG capsule, Take 1 capsule (50 mg total) by mouth 3 (three) times daily with meals., Disp: 30 capsule, Rfl: 0 .  losartan (COZAAR) 50 MG tablet, TAKE 1 TABLET(50 MG) BY MOUTH DAILY, Disp: 90 tablet, Rfl: 1 .  sildenafil (VIAGRA) 100 MG tablet, Take 0.5-1 tablets (50-100 mg total) by mouth daily as needed for erectile dysfunction., Disp: 30 tablet, Rfl: 1 .  triamcinolone cream (KENALOG) 0.1 %, Apply 1 application topically 2 (two) times daily., Disp: 60 g, Rfl:  0  Allergies  Allergen Reactions  . Levofloxacin In D5w     hallucinations    I personally reviewed active problem list, medication list, allergies, family history, social history, health maintenance with the patient/caregiver today.   ROS  Constitutional: Negative for fever or weight change.  Respiratory: Negative for cough and shortness of breath.   Cardiovascular: Negative for chest pain or palpitations.  Gastrointestinal: Negative for abdominal pain, no bowel changes.  Musculoskeletal: Positive  for gait problem and left ankle  joint swelling.  Skin: Negative for rash.  Neurological: Negative for dizziness or headache.  No other specific complaints in a complete review of systems (except as listed in HPI above).  Objective  Vitals:   04/19/19 1501  BP: 128/88   Pulse: 87  Resp: 16  Temp: 97.8 F (36.6 C)  TempSrc: Temporal  SpO2: 99%  Weight: 221 lb (100.2 kg)  Height: 6' 4.5" (1.943 m)    Body mass index is 26.55 kg/m.  Physical Exam  Constitutional: Patient appears well-developed and well-nourished. Overweight.  No distress.  HEENT: head atraumatic, normocephalic, pupils equal and reactive to light Cardiovascular: Normal rate, regular rhythm and normal heart sounds.  No murmur heard. edema only on left ankle  Pulmonary/Chest: Effort normal and breath sounds normal. No respiratory distress. Abdominal: Soft.  There is no tenderness. Muscular skeletal: very tender on left lateral ankle, mild increase of warmth, no redness, normal rom of ankle. Psychiatric: Patient has a normal mood and affect. behavior is normal. Judgment and thought content normal.  PHQ2/9: Depression screen [facility] 2/9 04/19/2019 11/12/2018 09/25/2018 06/27/2018 05/22/2018  Decreased Interest 0 0 0 0 0  Down, Depressed, Hopeless 0 0 0 0 0  PHQ - 2 Score 0 0 0 0 0  Altered sleeping 0 0 0 0 0  Tired, decreased energy 0 0 0 0 0  Change in appetite 0 0 0 0 0  Feeling bad or failure about yourself  0 0 0 0 0  Trouble concentrating 0 0 0 0 0  Moving slowly or fidgety/restless 0 0 0 0 0  Suicidal thoughts 0 0 0 0 0  PHQ-9 Score 0 0 0 0 0  Difficult doing work/chores Not difficult at all - Not difficult at all - Not difficult at all    phq 9 is negative   Fall Risk: Fall Risk  04/19/2019 09/25/2018 06/27/2018 05/22/2018 01/17/2018  Falls in the past year? 0 0 0 0 0  Number falls in past yr: 0 0 0 0 -  Injury with Fall? 0 0 0 0 -     Functional Status Survey: Is the patient deaf or have difficulty hearing?: No Does the patient have difficulty seeing, even when wearing glasses/contacts?: No Does the patient have difficulty concentrating, remembering, or making decisions?: No Does the patient have difficulty walking or climbing stairs?: Yes(from ankle pain) Does the patient  have difficulty dressing or bathing?: No Does the patient have difficulty doing errands alone such as visiting a doctor's office or shopping?: No    Assessment & Plan  1. Acute left ankle pain  - Uric acid - COMPLETE METABOLIC PANEL WITH GFR - CBC with Differential/Platelet - indomethacin (INDOCIN) 50 MG capsule; Take 1 capsule (50 mg total) by mouth 3 (three) times daily with meals.  Dispense: 30 capsule; Refill: 0  2. Benign essential HTN  - losartan (COZAAR) 50 MG tablet; TAKE 1 TABLET(50 MG) BY MOUTH DAILY  Dispense: 90 tablet; Refill: 1  3. Aortic  atherosclerosis ([place])  - Lipid panel  4. Diabetes mellitus screening  - Hemoglobin A1c

## 2019-04-19 NOTE — Patient Instructions (Signed)
Gout  Gout is a condition that causes painful swelling of the joints. Gout is a type of inflammation of the joints (arthritis). This condition is caused by having too much uric acid in the body. Uric acid is a chemical that forms when the body breaks down substances called purines. Purines are important for building body proteins. When the body has too much uric acid, sharp crystals can form and build up inside the joints. This causes pain and swelling. Gout attacks can happen quickly and may be very painful (acute gout). Over time, the attacks can affect more joints and become more frequent (chronic gout). Gout can also cause uric acid to build up under the skin and inside the kidneys. What are the causes? This condition is caused by too much uric acid in your blood. This can happen because:  Your kidneys do not remove enough uric acid from your blood. This is the most common cause.  Your body makes too much uric acid. This can happen with some cancers and cancer treatments. It can also occur if your body is breaking down too many red blood cells (hemolytic anemia).  You eat too many foods that are high in purines. These foods include organ meats and some seafood. Alcohol, especially beer, is also high in purines. A gout attack may be triggered by trauma or stress. What increases the risk? You are more likely to develop this condition if you:  Have a family history of gout.  Are male and middle-aged.  Are male and have gone through menopause.  Are obese.  Frequently drink alcohol, especially beer.  Are dehydrated.  Lose weight too quickly.  Have an organ transplant.  Have lead poisoning.  Take certain medicines, including aspirin, cyclosporine, diuretics, levodopa, and niacin.  Have kidney disease.  Have a skin condition called psoriasis. What are the signs or symptoms? An attack of acute gout happens quickly. It usually occurs in just one joint. The most common place is  the big toe. Attacks often start at night. Other joints that may be affected include joints of the feet, ankle, knee, fingers, wrist, or elbow. Symptoms of this condition may include:  Severe pain.  Warmth.  Swelling.  Stiffness.  Tenderness. The affected joint may be very painful to touch.  Shiny, red, or purple skin.  Chills and fever. Chronic gout may cause symptoms more frequently. More joints may be involved. You may also have white or yellow lumps (tophi) on your hands or feet or in other areas near your joints. How is this diagnosed? This condition is diagnosed based on your symptoms, medical history, and physical exam. You may have tests, such as:  Blood tests to measure uric acid levels.  Removal of joint fluid with a thin needle (aspiration) to look for uric acid crystals.  X-rays to look for joint damage. How is this treated? Treatment for this condition has two phases: treating an acute attack and preventing future attacks. Acute gout treatment may include medicines to reduce pain and swelling, including:  NSAIDs.  Steroids. These are strong anti-inflammatory medicines that can be taken by mouth (orally) or injected into a joint.  Colchicine. This medicine relieves pain and swelling when it is taken soon after an attack. It can be given by mouth or through an IV. Preventive treatment may include:  Daily use of smaller doses of NSAIDs or colchicine.  Use of a medicine that reduces uric acid levels in your blood.  Changes to your diet. You may   need to see a dietitian about what to eat and drink to prevent gout. Follow these instructions at home: During a gout attack   If directed, put ice on the affected area: ? Put ice in a plastic bag. ? Place a towel between your skin and the bag. ? Leave the ice on for 20 minutes, 2-3 times a day.  Raise (elevate) the affected joint above the level of your heart as often as possible.  Rest the joint as much as possible.  If the affected joint is in your leg, you may be given crutches to use.  Follow instructions from your health care provider about eating or drinking restrictions. Avoiding future gout attacks  Follow a low-purine diet as told by your dietitian or health care provider. Avoid foods and drinks that are high in purines, including liver, kidney, anchovies, asparagus, herring, mushrooms, mussels, and beer.  Maintain a healthy weight or lose weight if you are overweight. If you want to lose weight, talk with your health care provider. It is important that you do not lose weight too quickly.  Start or maintain an exercise program as told by your health care provider. Eating and drinking  Drink enough fluids to keep your urine pale yellow.  If you drink alcohol: ? Limit how much you use to:  0-1 drink a day for women.  0-2 drinks a day for men. ? Be aware of how much alcohol is in your drink. In the [place] one drink equals one 12 oz bottle of beer (355 mL) one 5 oz glass of wine (148 mL), or one 1 oz glass of hard liquor (44 mL). General instructions  Take over-the-counter and prescription medicines only as told by your health care provider.  Do not drive or use heavy machinery while taking prescription pain medicine.  Return to your normal activities as told by your health care provider. Ask your health care provider what activities are safe for you.  Keep all follow-up visits as told by your health care provider. This is important. Contact a health care provider if you have:  Another gout attack.  Continuing symptoms of a gout attack after 10 days of treatment.  Side effects from your medicines.  Chills or a fever.  Burning pain when you urinate.  Pain in your lower back or belly. Get help right away if you:  Have severe or uncontrolled pain.  Cannot urinate. Summary  Gout is painful swelling of the joints caused by inflammation.  The most common site of pain is the big  toe, but it can affect other joints in the body.  Medicines and dietary changes can help to prevent and treat gout attacks. This information is not intended to replace advice given to you by your health care provider. Make sure you discuss any questions you have with your health care provider. Document Revised: 09/06/2017 Document Reviewed: 09/06/2017 Elsevier Patient Education  2020 [employer]  

## 2019-04-20 LAB — COMPLETE METABOLIC PANEL WITHOUT GFR
AG Ratio: 1.6 (calc) (ref 1.0–2.5)
ALT: 23 U/L (ref 9–46)
AST: 32 U/L (ref 10–35)
Albumin: 4.2 g/dL (ref 3.6–5.1)
Alkaline phosphatase (APISO): 56 U/L (ref 35–144)
BUN: 10 mg/dL (ref 7–25)
CO2: 26 mmol/L (ref 20–32)
Calcium: 9 mg/dL (ref 8.6–10.3)
Chloride: 105 mmol/L (ref 98–110)
Creat: 0.97 mg/dL (ref 0.70–1.25)
GFR, Est African American: 96 mL/min/1.73m2
GFR, Est Non African American: 83 mL/min/1.73m2
Globulin: 2.7 g/dL (ref 1.9–3.7)
Glucose, Bld: 91 mg/dL (ref 65–99)
Potassium: 4 mmol/L (ref 3.5–5.3)
Sodium: 139 mmol/L (ref 135–146)
Total Bilirubin: 0.8 mg/dL (ref 0.2–1.2)
Total Protein: 6.9 g/dL (ref 6.1–8.1)

## 2019-04-20 LAB — CBC WITH DIFFERENTIAL/PLATELET
Absolute Monocytes: 378 {cells}/uL (ref 200–950)
Basophils Absolute: 32 {cells}/uL (ref 0–200)
Basophils Relative: 0.6 %
Eosinophils Absolute: 38 {cells}/uL (ref 15–500)
Eosinophils Relative: 0.7 %
HCT: 45.3 % (ref 38.5–50.0)
Hemoglobin: 15.5 g/dL (ref 13.2–17.1)
Lymphs Abs: 1496 {cells}/uL (ref 850–3900)
MCH: 31.8 pg (ref 27.0–33.0)
MCHC: 34.2 g/dL (ref 32.0–36.0)
MCV: 93 fL (ref 80.0–100.0)
MPV: 9.4 fL (ref 7.5–12.5)
Monocytes Relative: 7 %
Neutro Abs: 3456 {cells}/uL (ref 1500–7800)
Neutrophils Relative %: 64 %
Platelets: 259 Thousand/uL (ref 140–400)
RBC: 4.87 Million/uL (ref 4.20–5.80)
RDW: 13 % (ref 11.0–15.0)
Total Lymphocyte: 27.7 %
WBC: 5.4 Thousand/uL (ref 3.8–10.8)

## 2019-04-20 LAB — HEMOGLOBIN A1C
Hgb A1c MFr Bld: 4.7 %{Hb}
Mean Plasma Glucose: 88 (calc)
eAG (mmol/L): 4.9 (calc)

## 2019-04-20 LAB — LIPID PANEL
Cholesterol: 212 mg/dL — ABNORMAL HIGH (ref <200)
HDL: 88 mg/dL (ref >=40)
LDL Cholesterol (Calc): 110 mg/dL (calc) — ABNORMAL HIGH
Non-HDL Cholesterol (Calc): 124 mg/dL (calc) (ref <130)
Total CHOL/HDL Ratio: 2.4 (calc) (ref <5.0)
Triglycerides: 47 mg/dL (ref <150)

## 2019-04-20 LAB — URIC ACID: Uric Acid, Serum: 4.9 mg/dL (ref 4.0–8.0)

## 2019-05-09 ENCOUNTER — Ambulatory Visit: Payer: 59 | Admitting: Family Medicine

## 2019-05-14 ENCOUNTER — Ambulatory Visit: Payer: BC Managed Care – PPO | Admitting: Family Medicine

## 2019-05-21 ENCOUNTER — Encounter: Payer: Self-pay | Admitting: Family Medicine

## 2019-05-21 ENCOUNTER — Other Ambulatory Visit: Payer: Self-pay

## 2019-05-21 ENCOUNTER — Ambulatory Visit (INDEPENDENT_AMBULATORY_CARE_PROVIDER_SITE_OTHER): Payer: 59 | Admitting: Family Medicine

## 2019-05-21 VITALS — BP 138/88 | HR 102 | Temp 97.5°F | Resp 16 | Ht 76.5 in | Wt 228.7 lb

## 2019-05-21 DIAGNOSIS — I700 Atherosclerosis of aorta: Secondary | ICD-10-CM | POA: Diagnosis not present

## 2019-05-21 DIAGNOSIS — I10 Essential (primary) hypertension: Secondary | ICD-10-CM

## 2019-05-21 DIAGNOSIS — N529 Male erectile dysfunction, unspecified: Secondary | ICD-10-CM

## 2019-05-21 NOTE — Progress Notes (Signed)
Name: Kristopher Davis   MRN: [id number]    DOB: April 30, 1955   Date:05/21/2019       Progress Note  Subjective  Chief Complaint  Chief Complaint  Patient presents with  . Hypertension  . Atherosclerosis of Aorta    HPI  Aorta Atherosclerosis : discussed statin therapy, he will continue baby aspirin for now and a healthy diet , he does not want to start statins at this time  HTN: bp today is elevated, it was at goal on his last visit, no chest pain or palpitation. He left work early to cut his grass and rushed here for the visit   Left ankle pain: resolved with indomethacin after about 5 days , no problems since, uric acid was normal, discussed rechecking it now but he would like to hold off .  ED: he takes it very seldom , only two pills so far and does not need a refill   Patient Active Problem List   Diagnosis Date Noted  . Aortic atherosclerosis ([place]) 08/24/2017  . Pneumonia 08/23/2017  . Elevated AST (SGOT) 07/14/2015  . Prostate cancer screening 07/09/2015  . Medication monitoring encounter 07/09/2015  . Need for hepatitis C screening test 07/09/2015  . Tinea 08/11/2014  . 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 Disease of hair and hair follicles 99991111  . Benign essential HTN 01/07/2009  . Fatigue 01/18/2008  . Decreased libido 02/01/2007  . CD (contact dermatitis) 10/10/2006    Past Surgical History:  Procedure Laterality Date  . LUMBAR LAMINECTOMY     3 surgeries    Family History  Problem Relation Age of Onset  . Cancer Mother   . Hypertension Sister   . Anemia Daughter     Social History   Tobacco Use  . Smoking status: Current Every Day Smoker    Packs/day: 0.25    Years: 12.00    Pack years: 3.00    Types: Cigarettes  . Smokeless tobacco: Former Systems developer    Quit date: 08/21/2017  Substance Use Topics  . Alcohol use: Yes    Alcohol/week: 0.0 standard drinks    Comment: on the weekends     Current Outpatient Medications:  .  acetaminophen (TYLENOL) 500 MG tablet,  Take 500 mg by mouth every 6 (six) hours as needed for moderate pain or headache., Disp: , Rfl:  .  aspirin EC 81 MG tablet, Take 1 tablet (81 mg total) by mouth daily., Disp: 30 tablet, Rfl: 11 .  indomethacin (INDOCIN) 50 MG capsule, Take 1 capsule (50 mg total) by mouth 3 (three) times daily with meals., Disp: 30 capsule, Rfl: 0 .  losartan (COZAAR) 50 MG tablet, TAKE 1 TABLET(50 MG) BY MOUTH DAILY, Disp: 90 tablet, Rfl: 1 .  sildenafil (VIAGRA) 100 MG tablet, Take 0.5-1 tablets (50-100 mg total) by mouth daily as needed for erectile dysfunction., Disp: 30 tablet, Rfl: 1 .  triamcinolone cream (KENALOG) 0.1 %, Apply 1 application topically 2 (two) times daily., Disp: 60 g, Rfl: 0  Allergies  Allergen Reactions  . Levofloxacin In D5w     hallucinations    I personally reviewed active problem list, medication list, allergies, family history, social history, health maintenance with the patient/caregiver today.   ROS  Constitutional: Negative for fever or weight change.  Respiratory: Negative for cough and shortness of breath.   Cardiovascular: Negative for chest pain or palpitations.  Gastrointestinal: Negative for abdominal pain, no bowel changes.  Musculoskeletal: Negative for gait problem or joint swelling.  Skin: Negative  for rash.  Neurological: Negative for dizziness or headache.  No other specific complaints in a complete review of systems (except as listed in HPI above).  Objective  Vitals:   05/21/19 1502 05/21/19 1513 05/21/19 1523  BP: (!) 140/94 (!) 140/92 138/88  Pulse: (!) 102    Resp: 16    Temp: (!) 97.5 F (36.4 C)    TempSrc: Temporal    SpO2: 97%    Weight: 228 lb 11.2 oz (103.7 kg)    Height: 6' 4.5" (1.943 m)      Body mass index is 27.48 kg/m.  Physical Exam  Constitutional: Patient appears well-developed and well-nourished. Overweight. No distress.  HEENT: head atraumatic, normocephalic, pupils equal and reactive to light Cardiovascular: Normal  rate, regular rhythm and normal heart sounds.  No murmur heard. No BLE edema. Pulmonary/Chest: Effort normal and breath sounds normal. No respiratory distress. Abdominal: Soft.  There is no tenderness. Psychiatric: Patient has a normal mood and affect. behavior is normal. Judgment and thought content normal.   Recent Results (from the past 2160 hour(s))  Uric acid     Status: None   Collection Time: 04/19/19  3:35 PM  Result Value Ref Range   Uric Acid, Serum 4.9 4.0 - 8.0 mg/dL    Comment: Therapeutic target for gout patients: <6.0 mg/dL .   COMPLETE METABOLIC PANEL WITH GFR     Status: None   Collection Time: 04/19/19  3:35 PM  Result Value Ref Range   Glucose, Bld 91 65 - 99 mg/dL    Comment: .            Fasting reference interval .    BUN 10 7 - 25 mg/dL   Creat 0.97 0.70 - 1.25 mg/dL    Comment: For patients >53 years of age, the reference limit for Creatinine is approximately 13% higher for people identified as African-American. .    GFR, Est Non African American 83 > OR = 60 mL/min/1.14m2   GFR, Est African American 96 > OR = 60 mL/min/1.32m2   BUN/Creatinine Ratio NOT APPLICABLE 6 - 22 (calc)   Sodium 139 135 - 146 mmol/L   Potassium 4.0 3.5 - 5.3 mmol/L   Chloride 105 98 - 110 mmol/L   CO2 26 20 - 32 mmol/L   Calcium 9.0 8.6 - 10.3 mg/dL   Total Protein 6.9 6.1 - 8.1 g/dL   Albumin 4.2 3.6 - 5.1 g/dL   Globulin 2.7 1.9 - 3.7 g/dL (calc)   AG Ratio 1.6 1.0 - 2.5 (calc)   Total Bilirubin 0.8 0.2 - 1.2 mg/dL   Alkaline phosphatase (APISO) 56 35 - 144 U/L   AST 32 10 - 35 U/L   ALT 23 9 - 46 U/L  CBC with Differential/Platelet     Status: None   Collection Time: 04/19/19  3:35 PM  Result Value Ref Range   WBC 5.4 3.8 - 10.8 Thousand/uL   RBC 4.87 4.20 - 5.80 Million/uL   Hemoglobin 15.5 13.2 - 17.1 g/dL   HCT 45.3 38.5 - 50.0 %   MCV 93.0 80.0 - 100.0 fL   MCH 31.8 27.0 - 33.0 pg   MCHC 34.2 32.0 - 36.0 g/dL   RDW 13.0 11.0 - 15.0 %   Platelets 259 140 -  400 Thousand/uL   MPV 9.4 7.5 - 12.5 fL   Neutro Abs 3,456 1,500 - 7,800 cells/uL   Lymphs Abs 1,496 850 - 3,900 cells/uL   Absolute Monocytes 378 200 -  950 cells/uL   Eosinophils Absolute 38 15 - 500 cells/uL   Basophils Absolute 32 0 - 200 cells/uL   Neutrophils Relative % 64 %   Total Lymphocyte 27.7 %   Monocytes Relative 7.0 %   Eosinophils Relative 0.7 %   Basophils Relative 0.6 %  Hemoglobin A1c     Status: None   Collection Time: 04/19/19  3:35 PM  Result Value Ref Range   Hgb A1c MFr Bld 4.7 <5.7 % of total Hgb    Comment: For the purpose of screening for the presence of diabetes: . <5.7%       Consistent with the absence of diabetes 5.7-6.4%    Consistent with increased risk for diabetes             (prediabetes) > or =6.5%  Consistent with diabetes . This assay result is consistent with a decreased risk of diabetes. . Currently, no consensus exists regarding use of hemoglobin A1c for diagnosis of diabetes in children. . According to American Diabetes Association (ADA) guidelines, hemoglobin A1c <7.0% represents optimal control in non-pregnant diabetic patients. Different metrics may apply to specific patient populations.  Standards of Medical Care in Diabetes(ADA). .    Mean Plasma Glucose 88 (calc)   eAG (mmol/L) 4.9 (calc)  Lipid panel     Status: Abnormal   Collection Time: 04/19/19  3:35 PM  Result Value Ref Range   Cholesterol 212 (H) <200 mg/dL   HDL 88 > OR = 40 mg/dL   Triglycerides 47 <150 mg/dL   LDL Cholesterol (Calc) 110 (H) mg/dL (calc)    Comment: Reference range: <100 . Desirable range <100 mg/dL for primary prevention;   <70 mg/dL for patients with CHD or diabetic patients  with > or = 2 CHD risk factors. Marland Kitchen LDL-C is now calculated using the Martin-Hopkins  calculation, which is a validated novel method providing  better accuracy than the Friedewald equation in the  estimation of LDL-C.  Cresenciano Genre et al. Annamaria Helling. [id number]): 2061-2068   (http://education.[facility].com/faq/FAQ164)    Total CHOL/HDL Ratio 2.4 <5.0 (calc)   Non-HDL Cholesterol (Calc) 124 <130 mg/dL (calc)    Comment: For patients with diabetes plus 1 major ASCVD risk  factor, treating to a non-HDL-C goal of <100 mg/dL  (LDL-C of <70 mg/dL) is considered a therapeutic  option.       PHQ2/9: Depression screen [facility] 2/9 05/21/2019 04/19/2019 11/12/2018 09/25/2018 06/27/2018  Decreased Interest 0 0 0 0 0  Down, Depressed, Hopeless 0 0 0 0 0  PHQ - 2 Score 0 0 0 0 0  Altered sleeping 0 0 0 0 0  Tired, decreased energy 0 0 0 0 0  Change in appetite 0 0 0 0 0  Feeling bad or failure about yourself  0 0 0 0 0  Trouble concentrating 0 0 0 0 0  Moving slowly or fidgety/restless 0 0 0 0 0  Suicidal thoughts 0 0 0 0 0  PHQ-9 Score 0 0 0 0 0  Difficult doing work/chores - Not difficult at all - Not difficult at all -    phq 9 is negative  Fall Risk: Fall Risk  05/21/2019 04/19/2019 09/25/2018 06/27/2018 05/22/2018  Falls in the past year? 0 0 0 0 0  Number falls in past yr: 0 0 0 0 0  Injury with Fall? 0 0 0 0 0    Functional Status Survey: Is the patient deaf or have difficulty hearing?: No Does the patient have difficulty  seeing, even when wearing glasses/contacts?: No Does the patient have difficulty concentrating, remembering, or making decisions?: No Does the patient have difficulty walking or climbing stairs?: No Does the patient have difficulty dressing or bathing?: No Does the patient have difficulty doing errands alone such as visiting a doctor's office or shopping?: No    Assessment & Plan  1. Benign essential HTN  bp improved with rest, continue current dose of medications  2. Aortic atherosclerosis ([facility])  Not interested in statin therapy at this time  3. Erectile dysfunction, unspecified erectile dysfunction type  On viagra prn and still has medication at home

## 2019-07-17 ENCOUNTER — Other Ambulatory Visit: Payer: Self-pay | Admitting: Family Medicine

## 2019-07-17 DIAGNOSIS — G44039 Episodic paroxysmal hemicrania, not intractable: Secondary | ICD-10-CM

## 2019-11-22 ENCOUNTER — Other Ambulatory Visit: Payer: Self-pay | Admitting: Family Medicine

## 2019-11-22 DIAGNOSIS — I10 Essential (primary) hypertension: Secondary | ICD-10-CM

## 2019-11-26 ENCOUNTER — Encounter: Payer: 59 | Admitting: Family Medicine

## 2019-12-03 DIAGNOSIS — E7849 Other hyperlipidemia: Secondary | ICD-10-CM | POA: Insufficient documentation

## 2019-12-03 DIAGNOSIS — E785 Hyperlipidemia, unspecified: Secondary | ICD-10-CM | POA: Insufficient documentation

## 2019-12-03 NOTE — Progress Notes (Signed)
Patient ID: Kristopher Davis, male    DOB: 11-Nov-1955, 64 y.o.   MRN: [id number]  PCP: Steele Sizer, MD  Chief Complaint  Patient presents with  . Hypertension  . Hyperlipidemia  . Allergic Rhinitis     Complains of runny nose and nose bleeds after cutting grass.    Subjective:   Kristopher Davis is a 64 y.o. male, presents to clinic with CC of the following:  Chief Complaint  Patient presents with  . Hypertension  . Hyperlipidemia  . Allergic Rhinitis     Complains of runny nose and nose bleeds after cutting grass.    HPI:  Patient is a 64 year old male patient of Dr. Ancil Boozer Last visit with her was in March 2021 Follows up today. He notes the last 3 months have been very hectic, running around a lot between [place] and here, and just starting to settle down some.  HTN:  Medication regimen- losartan 50mg  daily Some days forgets to take medicine, but not often, and tries to take regularly He does not check blood pressures at home in the recent past, noted previously used to try to check 2 times a week BP Readings from Last 3 Encounters:  12/04/19 140/90  05/21/19 138/88  04/19/19 128/88   Noted rushed here from work today  Denies chest pain, palpitations, shortness of breath, increased lower extremity swelling, increased headaches, vision changes   Aorta Atherosclerosis/hyperlipidemia: discussed statin therapy in the past with Dr. Ancil Boozer, he will continue baby aspirin for now and a healthy diet noted previous  Lab Results  Component Value Date   CHOL 212 (H) 04/19/2019   HDL 88 04/19/2019   LDLCALC 110 (H) 04/19/2019   TRIG 47 04/19/2019   CHOLHDL 2.4 04/19/2019    ED:  Medication regimen-sildenafil - 100mg , takes 1/2 to 1 prn he takes it very seldom (3-4 times a year), somewhat helpful when takes  Allergic rhinitis Patient has increased runny nose and occasionally some bleeding from the nose when blows his nose, seems to only occur after outside cutting  the grass. Not get nose bleeds otherwise. Cuts grass once a week. Not wear a mask when cuts grass.  Bleeding episodes are not prolonged. Denies bleeding from other sites with no dark or black stools, no bleeding per rectum noted  Tobacco-current every day smoker, one pack every three days Alcohol-social, drinks beers daily, noted to to 3 at most  Denies any recent weight changes  Patient Active Problem List   Diagnosis Date Noted  . Other hyperlipidemia 12/03/2019  . Aortic atherosclerosis ([place]) 08/24/2017  . Pneumonia 08/23/2017  . Elevated AST (SGOT) 07/14/2015  . Prostate cancer screening 07/09/2015  . Medication monitoring encounter 07/09/2015  . Need for hepatitis C screening test 07/09/2015  . Tinea 08/11/2014  . 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 Disease of hair and hair follicles 17/40/8144  . Benign essential HTN 01/07/2009  . Fatigue 01/18/2008  . Decreased libido 02/01/2007  . CD (contact dermatitis) 10/10/2006      Current Outpatient Medications:  .  aspirin EC 81 MG tablet, Take 1 tablet (81 mg total) by mouth daily., Disp: 30 tablet, Rfl: 11 .  losartan (COZAAR) 50 MG tablet, TAKE 1 TABLET(50 MG) BY MOUTH DAILY, Disp: 30 tablet, Rfl: 0 .  sildenafil (VIAGRA) 100 MG tablet, Take 0.5-1 tablets (50-100 mg total) by mouth daily as needed for erectile dysfunction., Disp: 30 tablet, Rfl: 1   Allergies  Allergen Reactions  . Levofloxacin In D5w  hallucinations     Past Surgical History:  Procedure Laterality Date  . LUMBAR LAMINECTOMY     3 surgeries     Family History  Problem Relation Age of Onset  . Cancer Mother   . Hypertension Sister   . Anemia Daughter      Social History   Tobacco Use  . Smoking status: Current Every Day Smoker    Packs/day: 0.25    Years: 12.00    Pack years: 3.00    Types: Cigarettes  . Smokeless tobacco: Former Systems developer    Quit date: 08/21/2017  Substance Use Topics  . Alcohol use: Yes    Alcohol/week: 0.0 standard drinks     Comment: on the weekends    With staff assistance, above reviewed with the patient today.  ROS: As per HPI, otherwise no specific complaints on a limited and focused system review   No results found for this or any previous visit (from the past 72 hour(s)).   PHQ2/9: Depression screen [facility] 2/9 12/04/2019 05/21/2019 04/19/2019 11/12/2018 09/25/2018  Decreased Interest 0 0 0 0 0  Down, Depressed, Hopeless 0 0 0 0 0  PHQ - 2 Score 0 0 0 0 0  Altered sleeping - 0 0 0 0  Tired, decreased energy - 0 0 0 0  Change in appetite - 0 0 0 0  Feeling bad or failure about yourself  - 0 0 0 0  Trouble concentrating - 0 0 0 0  Moving slowly or fidgety/restless - 0 0 0 0  Suicidal thoughts - 0 0 0 0  PHQ-9 Score - 0 0 0 0  Difficult doing work/chores - - Not difficult at all - Not difficult at all   PHQ-2/9 Result is neg  Fall Risk: Fall Risk  12/04/2019 05/21/2019 04/19/2019 09/25/2018 06/27/2018  Falls in the past year? 0 0 0 0 0  Number falls in past yr: 0 0 0 0 0  Injury with Fall? 0 0 0 0 0      Objective:   Vitals:   12/04/19 0802  BP: 140/90  Pulse: 88  Resp: 16  Temp: 98 F (36.7 C)  TempSrc: Oral  SpO2: 99%  Weight: 216 lb 1.6 oz (98 kg)  Height: 6\' 5"  (1.956 m)    Body mass index is 25.63 kg/m. Recheck blood pressure by myself was 142/88 with an adult size cuff  Physical Exam   NAD, masked, pleasant, had a [place] [employer] mask HEENT - [place]/AT, sclera anicteric, PERRL, EOMI, conj - non-inj'ed, nares patent, no bleeding or dried blood concern, no big swollen turbinates, pharynx clear Neck - supple, no adenopathy, carotids 2+ and = without bruits bilat Car - RRR without m/g/r Pulm- RR and effort normal at rest, CTA without wheeze or rales Abd - soft, NT diffusely, ND,  Back - no CVA tenderness Ext - no LE edema,  Neuro/psychiatric - affect was not flat, appropriate with conversation  Alert   Grossly non-focal - good strength on testing extremities, sensation intact to LT  in distal extremities  Speech normal   Results for orders placed or performed in visit on 04/19/19  Uric acid  Result Value Ref Range   Uric Acid, Serum 4.9 4.0 - 8.0 mg/dL  COMPLETE METABOLIC PANEL WITH GFR  Result Value Ref Range   Glucose, Bld 91 65 - 99 mg/dL   BUN 10 7 - 25 mg/dL   Creat 0.97 0.70 - 1.25 mg/dL   GFR, Est Non African  American 83 > OR = 60 mL/min/1.4m2   GFR, Est African American 96 > OR = 60 mL/min/1.31m2   BUN/Creatinine Ratio NOT APPLICABLE 6 - 22 (calc)   Sodium 139 135 - 146 mmol/L   Potassium 4.0 3.5 - 5.3 mmol/L   Chloride 105 98 - 110 mmol/L   CO2 26 20 - 32 mmol/L   Calcium 9.0 8.6 - 10.3 mg/dL   Total Protein 6.9 6.1 - 8.1 g/dL   Albumin 4.2 3.6 - 5.1 g/dL   Globulin 2.7 1.9 - 3.7 g/dL (calc)   AG Ratio 1.6 1.0 - 2.5 (calc)   Total Bilirubin 0.8 0.2 - 1.2 mg/dL   Alkaline phosphatase (APISO) 56 35 - 144 U/L   AST 32 10 - 35 U/L   ALT 23 9 - 46 U/L  CBC with Differential/Platelet  Result Value Ref Range   WBC 5.4 3.8 - 10.8 Thousand/uL   RBC 4.87 4.20 - 5.80 Million/uL   Hemoglobin 15.5 13.2 - 17.1 g/dL   HCT 45.3 38 - 50 %   MCV 93.0 80.0 - 100.0 fL   MCH 31.8 27.0 - 33.0 pg   MCHC 34.2 32.0 - 36.0 g/dL   RDW 13.0 11.0 - 15.0 %   Platelets 259 140 - 400 Thousand/uL   MPV 9.4 7.5 - 12.5 fL   Neutro Abs 3,456 1,500 - 7,800 cells/uL   Lymphs Abs 1,496 850 - 3,900 cells/uL   Absolute Monocytes 378 200 - 950 cells/uL   Eosinophils Absolute 38 15 - 500 cells/uL   Basophils Absolute 32 0 - 200 cells/uL   Neutrophils Relative % 64 %   Total Lymphocyte 27.7 %   Monocytes Relative 7.0 %   Eosinophils Relative 0.7 %   Basophils Relative 0.6 %  Hemoglobin A1c  Result Value Ref Range   Hgb A1c MFr Bld 4.7 <5.7 % of total Hgb   Mean Plasma Glucose 88 (calc)   eAG (mmol/L) 4.9 (calc)  Lipid panel  Result Value Ref Range   Cholesterol 212 (H) <200 mg/dL   HDL 88 > OR = 40 mg/dL   Triglycerides 47 <150 mg/dL   LDL Cholesterol (Calc) 110 (H)  mg/dL (calc)   Total CHOL/HDL Ratio 2.4 <5.0 (calc)   Non-HDL Cholesterol (Calc) 124 <130 mg/dL (calc)   Last labs above reviewed from February    Assessment & Plan:    1. Benign essential HTN Noted today his blood pressures were borderline high, and he noted he did rushed over here from work.  Also noted he has had a lot of stresses in the past 3 months with travel noted.  He has remained on the losartan product. He denied concerning symptoms, although has been having some recent nosebleeds, which I noted can be a sign of higher blood pressure.  He notes it only occurs after he is out cutting his grass and then due to increased drainage, blowing his nose a lot. Recommended he return to checking his blood pressures a couple times a week at home, and writing them down.  If the top number is greater than 140 or the bottom number greater than 90 with any regularity, he needs to call to follow-up at that point.  Discussed possibly increasing dose of his medication and possibly rechecking labs at that point.   2. Aortic atherosclerosis ([place]) Has not wanted to start a statin in the past, and his last lipid panel was reviewed. Do feel a statin would be a good addition, and  hope he will agree to this in the near future, especially if his lipid panel is concerning over time. Continues a baby aspirin a day  3. Erectile dysfunction, unspecified erectile dysfunction type Uses the sildenafil product only rarely.  4. Other hyperlipidemia As above,  5. Other rhinitis/epistaxis Probable allergic contribution to his rhinitis symptoms, and only occurring after cutting the grass once weekly. Recommended a nonsedating antihistamine like a generic Claritin or Allegra product on the morning he is cutting the grass, and may continue the next morning if still having some symptoms. Also recommended a nasal saline spray to use, as dryness can often contribute to the bleeding. Avoid a nasal steroid like a  fluticasone/Flonase product, as that can increase dryness and bleeding. Discussed management of the nosebleeds as they occur, including compression to help with cessation. If symptoms not improving or more problematic, should follow-up.  Discussed the follow-up sooner than the normal 6 months, and he preferred to keep the follow-up at 6 months, and follow-up sooner as needed as we discussed and noted above.     Towanda Malkin, MD 12/04/19 8:11 AM

## 2019-12-04 ENCOUNTER — Other Ambulatory Visit: Payer: Self-pay

## 2019-12-04 ENCOUNTER — Ambulatory Visit (INDEPENDENT_AMBULATORY_CARE_PROVIDER_SITE_OTHER): Payer: BC Managed Care – PPO | Admitting: Internal Medicine

## 2019-12-04 ENCOUNTER — Encounter: Payer: Self-pay | Admitting: Internal Medicine

## 2019-12-04 VITALS — BP 140/90 | HR 88 | Temp 98.0°F | Resp 16 | Ht 77.0 in | Wt 216.1 lb

## 2019-12-04 DIAGNOSIS — I10 Essential (primary) hypertension: Secondary | ICD-10-CM | POA: Diagnosis not present

## 2019-12-04 DIAGNOSIS — I700 Atherosclerosis of aorta: Secondary | ICD-10-CM | POA: Diagnosis not present

## 2019-12-04 DIAGNOSIS — N529 Male erectile dysfunction, unspecified: Secondary | ICD-10-CM | POA: Diagnosis not present

## 2019-12-04 DIAGNOSIS — J310 Chronic rhinitis: Secondary | ICD-10-CM | POA: Insufficient documentation

## 2019-12-04 DIAGNOSIS — E7849 Other hyperlipidemia: Secondary | ICD-10-CM

## 2019-12-04 DIAGNOSIS — R040 Epistaxis: Secondary | ICD-10-CM | POA: Insufficient documentation

## 2019-12-04 NOTE — Patient Instructions (Signed)
Recommend taking a generic claritin or allegra in the morning before mowing, also use a nasal saline spray as well to help.  Please check BP's at home twice weekly and record, if the top number is >140 or the bottom number is > 90 with and regularity, please follow-up.

## 2019-12-25 ENCOUNTER — Encounter: Payer: Self-pay | Admitting: Family Medicine

## 2019-12-26 ENCOUNTER — Emergency Department
Admission: EM | Admit: 2019-12-26 | Discharge: 2019-12-26 | Disposition: A | Discharge status: Home / Self Care | Payer: BC Managed Care – PPO | Attending: Emergency Medicine | Admitting: Emergency Medicine

## 2019-12-26 ENCOUNTER — Other Ambulatory Visit: Payer: Self-pay

## 2019-12-26 ENCOUNTER — Encounter: Payer: Self-pay | Admitting: Intensive Care

## 2019-12-26 ENCOUNTER — Emergency Department: Payer: BC Managed Care – PPO

## 2019-12-26 DIAGNOSIS — Z20822 Contact with and (suspected) exposure to covid-19: Secondary | ICD-10-CM | POA: Diagnosis not present

## 2019-12-26 DIAGNOSIS — R0602 Shortness of breath: Secondary | ICD-10-CM | POA: Diagnosis not present

## 2019-12-26 DIAGNOSIS — Z79899 Other long term (current) drug therapy: Secondary | ICD-10-CM | POA: Diagnosis not present

## 2019-12-26 DIAGNOSIS — Z86018 Personal history of other benign neoplasm: Secondary | ICD-10-CM | POA: Insufficient documentation

## 2019-12-26 DIAGNOSIS — Z87891 Personal history of nicotine dependence: Secondary | ICD-10-CM | POA: Insufficient documentation

## 2019-12-26 DIAGNOSIS — Z7982 Long term (current) use of aspirin: Secondary | ICD-10-CM | POA: Insufficient documentation

## 2019-12-26 DIAGNOSIS — I10 Essential (primary) hypertension: Secondary | ICD-10-CM | POA: Diagnosis not present

## 2019-12-26 DIAGNOSIS — R030 Elevated blood-pressure reading, without diagnosis of hypertension: Secondary | ICD-10-CM | POA: Diagnosis not present

## 2019-12-26 LAB — COMPREHENSIVE METABOLIC PANEL
ALT: 41 U/L (ref 0–44)
AST: 58 U/L — ABNORMAL HIGH (ref 15–41)
Albumin: 3.6 g/dL (ref 3.5–5.0)
Alkaline Phosphatase: 47 U/L (ref 38–126)
Anion gap: 10 (ref 5–15)
BUN: 8 mg/dL (ref 8–23)
CO2: 26 mmol/L (ref 22–32)
Calcium: 8.9 mg/dL (ref 8.9–10.3)
Chloride: 104 mmol/L (ref 98–111)
Creatinine, Ser: 0.87 mg/dL (ref 0.61–1.24)
GFR, Estimated: >60 mL/min (ref >60)
Glucose, Bld: 85 mg/dL (ref 70–99)
Potassium: 4.1 mmol/L (ref 3.5–5.1)
Sodium: 140 mmol/L (ref 135–145)
Total Bilirubin: 1.2 mg/dL (ref 0.3–1.2)
Total Protein: 6.7 g/dL (ref 6.5–8.1)

## 2019-12-26 LAB — CBC WITH DIFFERENTIAL/PLATELET
Abs Immature Granulocytes: 0.01 10*3/uL (ref 0.00–0.07)
Basophils Absolute: 0 10*3/uL (ref 0.0–0.1)
Basophils Relative: 1 %
Eosinophils Absolute: 0 10*3/uL (ref 0.0–0.5)
Eosinophils Relative: 1 %
HCT: 36.8 % — ABNORMAL LOW (ref 39.0–52.0)
Hemoglobin: 12.4 g/dL — ABNORMAL LOW (ref 13.0–17.0)
Immature Granulocytes: 0 %
Lymphocytes Relative: 26 %
Lymphs Abs: 1.1 10*3/uL (ref 0.7–4.0)
MCH: 32.6 pg (ref 26.0–34.0)
MCHC: 33.7 g/dL (ref 30.0–36.0)
MCV: 96.8 fL (ref 80.0–100.0)
Monocytes Absolute: 0.4 10*3/uL (ref 0.1–1.0)
Monocytes Relative: 9 %
Neutro Abs: 2.7 10*3/uL (ref 1.7–7.7)
Neutrophils Relative %: 63 %
Platelets: 200 10*3/uL (ref 150–400)
RBC: 3.8 MIL/uL — ABNORMAL LOW (ref 4.22–5.81)
RDW: 14.1 % (ref 11.5–15.5)
WBC: 4.3 10*3/uL (ref 4.0–10.5)
nRBC: 0 % (ref 0.0–0.2)

## 2019-12-26 LAB — BRAIN NATRIURETIC PEPTIDE: B Natriuretic Peptide: 47.3 pg/mL (ref 0.0–100.0)

## 2019-12-26 LAB — TROPONIN I (HIGH SENSITIVITY): Troponin I (High Sensitivity): 5 ng/L (ref <18)

## 2019-12-26 NOTE — ED Notes (Signed)
Pt declined Covid swab , provider aware. Dont need troponin per provider.  Pt calm , collective, denied pain or sob

## 2019-12-26 NOTE — ED Triage Notes (Signed)
Patient reports his blood pressure has been "somewhat high" and reports right calf pain from possible bee sting after working in yard a few days ago. NAD noted. Denies pain. Denies headache. Denies any symptoms

## 2019-12-26 NOTE — ED Notes (Signed)
Patient transported to X-ray 

## 2019-12-26 NOTE — ED Provider Notes (Signed)
ED ECG REPORT I, Arta Silence, the attending physician, personally viewed and interpreted this ECG.  Date: 12/26/2019 EKG Time: 1519 Rate: 70 Rhythm: normal sinus rhythm QRS Axis: normal Intervals: normal ST/T Wave abnormalities: normal Narrative Interpretation: LVH with no evidence of acute ischemia    Arta Silence, MD 12/26/19 1533

## 2019-12-26 NOTE — ED Notes (Signed)
Pt back from x-ray.

## 2019-12-26 NOTE — ED Provider Notes (Signed)
[facility] Provider Note ____________________________________________  Time seen: 1455  I have reviewed the triage vital signs and the nursing notes.  HISTORY  Chief Complaint  Hypertension and Leg Pain (right)   HPI Kristopher Davis is a 64 y.o. male presents to the ED accompanied by his wife, for evaluation of symptoms of "somewhat high" blood pressure and some intermittent fleeting posterior thigh burning.  Patient denies any frank chest pain, shortness of breath,, diaphoresis, nausea, vomiting, or syncope.  Patient apparently related to his PCP that he had been feeling fatigued over the last few days, citing shortness of breath at night when he was sleeping.  He also reports some ankle swelling for the last several months.  He additionally reported to his PCP that he was feeling dizzy, had decreased appetite, and break out in sweats.  Patient was advised by his primary provider to report to the ED to rule out congestive heart failure.  Patient presents to the ED for evaluation of his symptoms.  His past medical history is consistent with hypertension, and hypercholesteremia.  Patient was started on losartan about a month ago by his PCP, he reports taking the medication daily in the morning as prescribed.  Patient admits to being vaccinated with Covid vaccine.  Past Medical History:  Diagnosis Date  . Decreased libido 02/01/2007  . Hypertension     Patient Active Problem List   Diagnosis Date Noted  . Nasal inflammation 12/04/2019  . Epistaxis 12/04/2019  . Erectile dysfunction 12/04/2019  . Other hyperlipidemia 12/03/2019  . Aortic atherosclerosis ([place]) 08/24/2017  . Pneumonia 08/23/2017  . Elevated AST (SGOT) 07/14/2015  . Prostate cancer screening 07/09/2015  . Medication monitoring encounter 07/09/2015  . Need for hepatitis C screening test 07/09/2015  . Tinea 08/11/2014  . 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 Disease of hair and hair follicles  14/48/1856  . Benign essential HTN 01/07/2009  . Fatigue 01/18/2008  . Decreased libido 02/01/2007  . CD (contact dermatitis) 10/10/2006    Past Surgical History:  Procedure Laterality Date  . LUMBAR LAMINECTOMY     3 surgeries    Prior to Admission medications   Medication Sig Start Date End Date Taking? Authorizing Provider  aspirin EC 81 MG tablet Take 1 tablet (81 mg total) by mouth daily. 07/09/15   Arnetha Courser, MD  losartan (COZAAR) 50 MG tablet TAKE 1 TABLET(50 MG) BY MOUTH DAILY 11/22/19   Steele Sizer, MD  sildenafil (VIAGRA) 100 MG tablet Take 0.5-1 tablets (50-100 mg total) by mouth daily as needed for erectile dysfunction. 11/12/18   Steele Sizer, MD    Allergies Levofloxacin in d5w  Family History  Problem Relation Age of Onset  . Cancer Mother   . Hypertension Sister   . Anemia Daughter     Social History Social History   Tobacco Use  . Smoking status: Former Smoker    Packs/day: 0.25    Years: 12.00    Pack years: 3.00    Types: Cigarettes    Quit date: 12/24/2019  . Smokeless tobacco: Former Systems developer    Quit date: 08/21/2017  Vaping Use  . Vaping Use: Never used  Substance Use Topics  . Alcohol use: Yes    Alcohol/week: 2.0 standard drinks    Types: 2 Cans of beer per week  . Drug use: No    Review of Systems  Constitutional: Negative for fever.  reports fatigue. Eyes: Negative for visual changes. ENT: Negative for sore throat. Cardiovascular: Negative  for chest pain. Respiratory: Positive for shortness of breath. Gastrointestinal: Negative for abdominal pain, vomiting and diarrhea. Genitourinary: Negative for dysuria. Musculoskeletal: Negative for back pain. Skin: Negative for rash. Neurological: Negative for headaches, focal weakness or numbness. ____________________________________________  PHYSICAL EXAM:  VITAL SIGNS: ED Triage Vitals [12/26/19 1424]  Enc Vitals Group     BP (!) 156/90     Pulse Rate 77     Resp 16      Temp 99 F (37.2 C)     Temp Source Oral     SpO2 100 %     Weight 216 lb (98 kg)     Height 6' 4.5" (1.943 m)     Head Circumference      Peak Flow      Pain Score 0     Pain Loc      Pain Edu?      Excl. in [place]?     Constitutional: Alert and oriented. Well appearing and in no distress. Head: Normocephalic and atraumatic. Eyes: Conjunctivae are normal. PERRL. Normal extraocular movements Ears: Canals clear. TMs intact bilaterally. Nose: No congestion/rhinorrhea/epistaxis. Mouth/Throat: Mucous membranes are moist. Neck: Supple. No thyromegaly. Hematological/Lymphatic/Immunological: No cervical lymphadenopathy. Cardiovascular: Normal rate, regular rhythm. Normal distal pulses. Respiratory: Normal respiratory effort. No wheezes/rales/rhonchi. Gastrointestinal: Soft and nontender. No distention. Musculoskeletal: Nontender with normal range of motion in all extremities.  Neurologic:  Normal gait without ataxia. Normal speech and language. No gross focal neurologic deficits are appreciated. Skin:  Skin is warm, dry and intact. No rash noted. Psychiatric: Mood and affect are normal. Patient exhibits appropriate insight and judgment. ____________________________________________   LABS (pertinent positives/negatives) Labs Reviewed  COMPREHENSIVE METABOLIC PANEL - Abnormal; Notable for the following components:      Result Value   AST 58 (*)    All other components within normal limits  CBC WITH DIFFERENTIAL/PLATELET - Abnormal; Notable for the following components:   RBC 3.80 (*)    Hemoglobin 12.4 (*)    HCT 36.8 (*)    All other components within normal limits  RESPIRATORY PANEL BY RT PCR (FLU A&B, COVID)  BRAIN NATRIURETIC PEPTIDE  TROPONIN I (HIGH SENSITIVITY)  TROPONIN I (HIGH SENSITIVITY)  ____________________________________________  EKG  See EKG report  ____________________________________________   RADIOLOGY  CXR  Negative  IMPRESSION: No acute  cardiopulmonary disease.  ____________________________________________  PROCEDURES  Procedures ____________________________________________  INITIAL IMPRESSION / ASSESSMENT AND PLAN / ED COURSE  Differential diagnosis includes, but is not limited to, ACS, aortic dissection, pulmonary embolism, cardiac tamponade, pneumothorax, pneumonia, pericarditis, myocarditis, GI-related causes including esophagitis/gastritis, and musculoskeletal chest wall pain.    Patient with ED evaluation of 1 week complaint of intermittent shortness of breath primarily at night with sleeping.  Patient is also reporting some lower extremity swelling to his primary provider he was advised to report to the ED for further evaluation and work-up.  His work-up is overall benign reassuring at this time.  No red flags on exam.  No's indication of acute ACS or MI.  Patient chest x-ray reviewed by me is negative for any acute findings and is consistent with the radiologist review.  His labs also did not show any abnormalities and electrolytes or BNP.  Patient was reassured by his negative work-up, and was initially agreeing to a Covid swab to further evaluate his vague symptoms of fatigue, sweats, and dizziness.  He ultimately declined that swab at the time of disposition.  Patient is discharged to follow-up with primary provider for ongoing  symptoms.  He will continue with his home as as prescribed return precautions have been discussed.  Anthonio Mizzell was evaluated in Emergency Department on 12/26/2019 for the symptoms described in the history of present illness. He was evaluated in the context of the global COVID-19 pandemic, which necessitated consideration that the patient might be at risk for infection with the SARS-CoV-2 virus that causes COVID-19. Institutional protocols and algorithms that pertain to the evaluation of patients at risk for COVID-19 are in a state of rapid change based on information released by regulatory bodies  including the CDC and federal and state organizations. These policies and algorithms were followed during the patient's care in the ED. ___________________________________________  FINAL CLINICAL IMPRESSION(S) / ED DIAGNOSES  Final diagnoses:  Primary hypertension      Carmie End, Dannielle Karvonen, PA-C 12/26/19 1932    Arta Silence, MD 12/26/19 1958

## 2019-12-26 NOTE — Discharge Instructions (Addendum)
Your exam, labs, EKG, and chest x-ray are all normal and reassuring at this time.  There is no indication of any acute hypertensive emergency, no signs of any congestive heart failure, and no signs of any acute heart attack.  You should continue taking your blood pressure medicine as prescribed.  Follow-up with primary provider for ongoing symptoms peer return to the ED if necessary.

## 2019-12-30 ENCOUNTER — Telehealth: Payer: Self-pay | Admitting: Family Medicine

## 2019-12-30 NOTE — Telephone Encounter (Signed)
Patient is calling to schedule hospital follow up with Dr. Ancil Boozer. There are no available appts until 01/22. Patient is requesting a hospital follow up appt tomorrow 12/31/19 because he will be off from work. Please advise CB- [phone] or [phone]

## 2019-12-31 NOTE — Telephone Encounter (Signed)
Pt is off today and I advised him if Dr Kristopher Davis gets a cancellation we would call him, as of now he is scheduled for Thursday and he will call if he needs to r/s

## 2020-01-02 ENCOUNTER — Ambulatory Visit: Payer: BC Managed Care – PPO | Admitting: Family Medicine

## 2020-01-27 NOTE — Progress Notes (Addendum)
Name: Kristopher Davis   MRN: [id number]    DOB: March 31, 1955   Date:01/28/2020       Progress Note  [facility] Follow Up  HPI   Mr. Koval sent [place] a message the end of October stating that he was having some SOB at night/ orthopnea, having some episodes of dizziness and ankle edema. We replied advising him to go to [facility]. He only got a message late at night and went to [facility] for evaluation, his bp was very high . BNP negative, he was found to be anemic, he is due for colonoscopy explained the importance of having the study done Symptoms resolved , he states he is feeling fine now   He has been taking losartan 50 mg, he is under more stress at work, he states he does not like his supervisor, he feels like he does not give him enough personal space. He is thinking about quitting his job. We will adjust dose of losartan from 50 to 100 mg and recheck bp with CMA next week.  Facial rash: going on for a while, on forehead and nose, maxillary area, some redness and itching, using otc topical cream and seems to help but not resolving symptoms   Patient Active Problem List   Diagnosis Date Noted  . Nasal inflammation 12/04/2019  . Epistaxis 12/04/2019  . Erectile dysfunction 12/04/2019  . Other hyperlipidemia 12/03/2019  . Aortic atherosclerosis ([place]) 08/24/2017  . Pneumonia 08/23/2017  . Elevated AST (SGOT) 07/14/2015  . Prostate cancer screening 07/09/2015  . Medication monitoring encounter 07/09/2015  . Need for hepatitis C screening test 07/09/2015  . Tinea 08/11/2014  . 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 Disease of hair and hair follicles 35/70/1779  . Benign essential HTN 01/07/2009  . Fatigue 01/18/2008  . Decreased libido 02/01/2007  . CD (contact dermatitis) 10/10/2006    Past Surgical History:  Procedure Laterality Date  . LUMBAR LAMINECTOMY     3 surgeries    Family History  Problem Relation Age of Onset  . Cancer Mother   . Hypertension Sister   . Anemia Daughter      Social History   Tobacco Use  . Smoking status: Former Smoker    Packs/day: 0.25    Years: 12.00    Pack years: 3.00    Types: Cigarettes    Quit date: 12/24/2019    Years since quitting: 0.0  . Smokeless tobacco: Former Systems developer    Quit date: 08/21/2017  Substance Use Topics  . Alcohol use: Yes    Alcohol/week: 2.0 standard drinks    Types: 2 Cans of beer per week     Current Outpatient Medications:  .  aspirin EC 81 MG tablet, Take 1 tablet (81 mg total) by mouth daily., Disp: 30 tablet, Rfl: 11 .  losartan (COZAAR) 50 MG tablet, TAKE 1 TABLET(50 MG) BY MOUTH DAILY, Disp: 30 tablet, Rfl: 0 .  sildenafil (VIAGRA) 100 MG tablet, Take 0.5-1 tablets (50-100 mg total) by mouth daily as needed for erectile dysfunction., Disp: 30 tablet, Rfl: 1  Allergies  Allergen Reactions  . Levofloxacin In D5w     hallucinations    I personally reviewed active problem list, medication list, allergies, family history, social history, health maintenance with the patient/caregiver today.   ROS  Constitutional: Negative for fever, positive for  weight change gain.  Respiratory: Negative for cough and shortness of breath.   Cardiovascular: Negative for chest pain or palpitations.  Gastrointestinal: Negative for abdominal  pain, no bowel changes.  Musculoskeletal: Negative for gait problem or joint swelling.  Skin: positive  for rash.  Neurological: Negative for dizziness or headache.  No other specific complaints in a complete review of systems (except as listed in HPI above).  Objective  Vitals:   01/28/20 1320 01/28/20 1323  BP: (!) 160/94 (!) 158/92  Pulse: 87   Resp: 16   Temp: 98 F (36.7 C)   TempSrc: Oral   SpO2: 96%   Weight: 225 lb 1.6 oz (102.1 kg)   Height: 6\' 5"  (1.956 m)     Body mass index is 26.69 kg/m.  Physical Exam  Constitutional: Patient appears well-developed and well-nourished.  No distress.  HEENT: head atraumatic, normocephalic, pupils equal and  reactive to light,  neck supple Cardiovascular: Normal rate, regular rhythm and normal heart sounds.  No murmur heard. No BLE edema. Pulmonary/Chest: Effort normal and breath sounds normal. No respiratory distress. Abdominal: Soft.  There is no tenderness. Psychiatric: Patient has a normal mood and affect. behavior is normal. Judgment and thought content normal.   Recent Results (from the past 2160 hour(s))  Comprehensive metabolic panel     Status: Abnormal   Collection Time: 12/26/19  3:09 PM  Result Value Ref Range   Sodium 140 135 - 145 mmol/L   Potassium 4.1 3.5 - 5.1 mmol/L   Chloride 104 98 - 111 mmol/L   CO2 26 22 - 32 mmol/L   Glucose, Bld 85 70 - 99 mg/dL    Comment: Glucose reference range applies only to samples taken after fasting for at least 8 hours.   BUN 8 8 - 23 mg/dL   Creatinine, Ser 0.87 0.61 - 1.24 mg/dL   Calcium 8.9 8.9 - 10.3 mg/dL   Total Protein 6.7 6.5 - 8.1 g/dL   Albumin 3.6 3.5 - 5.0 g/dL   AST 58 (H) 15 - 41 U/L   ALT 41 0 - 44 U/L   Alkaline Phosphatase 47 38 - 126 U/L   Total Bilirubin 1.2 0.3 - 1.2 mg/dL   GFR, Estimated >60 >60 mL/min    Comment: (NOTE) Calculated using the CKD-EPI Creatinine Equation (2021)    Anion gap 10 5 - 15    Comment: Performed at [facility], [address] [place],  [address]  CBC with Differential     Status: Abnormal   Collection Time: 12/26/19  3:09 PM  Result Value Ref Range   WBC 4.3 4.0 - 10.5 K/uL   RBC 3.80 (L) 4.22 - 5.81 MIL/uL   Hemoglobin 12.4 (L) 13.0 - 17.0 g/dL   HCT 36.8 (L) 39 - 52 %   MCV 96.8 80.0 - 100.0 fL   MCH 32.6 26.0 - 34.0 pg   MCHC 33.7 30.0 - 36.0 g/dL   RDW 14.1 11.5 - 15.5 %   Platelets 200 150 - 400 K/uL   nRBC 0.0 0.0 - 0.2 %   Neutrophils Relative % 63 %   Neutro Abs 2.7 1.7 - 7.7 K/uL   Lymphocytes Relative 26 %   Lymphs Abs 1.1 0.7 - 4.0 K/uL   Monocytes Relative 9 %   Monocytes Absolute 0.4 0.1 - 1.0 K/uL   Eosinophils Relative 1 %   Eosinophils  Absolute 0.0 0.0 - 0.5 K/uL   Basophils Relative 1 %   Basophils Absolute 0.0 0.0 - 0.1 K/uL   Immature Granulocytes 0 %   Abs Immature Granulocytes 0.01 0.00 - 0.07 K/uL    Comment:  Performed at [facility], [place], [place] [address]  Troponin I (High Sensitivity)     Status: None   Collection Time: 12/26/19  3:09 PM  Result Value Ref Range   Troponin I (High Sensitivity) 5 <18 ng/L    Comment: (NOTE) Elevated high sensitivity troponin I (hsTnI) values and significant  changes across serial measurements may suggest ACS but many other  chronic and acute conditions are known to elevate hsTnI results.  Refer to the "Links" section for chest pain algorithms and additional  guidance. Performed at [facility], [address] [place], [place] [address]   Brain natriuretic peptide     Status: None   Collection Time: 12/26/19  3:09 PM  Result Value Ref Range   B Natriuretic Peptide 47.3 0.0 - 100.0 pg/mL    Comment: Performed at [facility], [address] [place], [place] [address]      PHQ2/9: Depression screen [facility] 2/9 01/28/2020 12/04/2019 05/21/2019 04/19/2019 11/12/2018  Decreased Interest 0 0 0 0 0  Down, Depressed, Hopeless 0 0 0 0 0  PHQ - 2 Score 0 0 0 0 0  Altered sleeping - - 0 0 0  Tired, decreased energy - - 0 0 0  Change in appetite - - 0 0 0  Feeling bad or failure about yourself  - - 0 0 0  Trouble concentrating - - 0 0 0  Moving slowly or fidgety/restless - - 0 0 0  Suicidal thoughts - - 0 0 0  PHQ-9 Score - - 0 0 0  Difficult doing work/chores - - - Not difficult at all -  Some recent data might be hidden    phq 9 is negative   Fall Risk: Fall Risk  01/28/2020 12/04/2019 05/21/2019 04/19/2019 09/25/2018  Falls in the past year? 0 0 0 0 0  Number falls in past yr: 0 0 0 0 0  Injury with Fall? 0 0 0 0 0    Functional Status Survey: Is the patient deaf or have difficulty hearing?: Yes Does the patient have  difficulty seeing, even when wearing glasses/contacts?: No Does the patient have difficulty concentrating, remembering, or making decisions?: No Does the patient have difficulty walking or climbing stairs?: No Does the patient have difficulty dressing or bathing?: No Does the patient have difficulty doing errands alone such as visiting a doctor's office or shopping?: No    Assessment & Plan  1. Benign essential HTN  - losartan (COZAAR) 50 MG tablet; Take 2 tablets (100 mg total) by mouth daily. Take two  Dispense: 30 tablet; Refill: 0  2. Anemia, unspecified type  - CBC with Differential/Platelet - Iron, TIBC and Ferritin Panel - Ambulatory referral to Gastroenterology  3. Colon cancer screening  - Ambulatory referral to Gastroenterology  4. Facial rash  Possible rosacea, offered empirical therapy or referral to Dermatologist, he would like to try medication and if no improvement referral  - clindamycin (CLINDAGEL) 1 % gel; Apply topically 2 (two) times daily.  Dispense: 30 g; Refill: 0

## 2020-01-28 ENCOUNTER — Other Ambulatory Visit: Payer: Self-pay

## 2020-01-28 ENCOUNTER — Encounter: Payer: Self-pay | Admitting: Family Medicine

## 2020-01-28 ENCOUNTER — Ambulatory Visit: Payer: BC Managed Care – PPO | Admitting: Family Medicine

## 2020-01-28 VITALS — BP 158/92 | HR 87 | Temp 98.0°F | Resp 16 | Ht 77.0 in | Wt 225.1 lb

## 2020-01-28 DIAGNOSIS — R21 Rash and other nonspecific skin eruption: Secondary | ICD-10-CM | POA: Diagnosis not present

## 2020-01-28 DIAGNOSIS — I10 Essential (primary) hypertension: Secondary | ICD-10-CM

## 2020-01-28 DIAGNOSIS — D649 Anemia, unspecified: Secondary | ICD-10-CM | POA: Diagnosis not present

## 2020-01-28 DIAGNOSIS — Z1211 Encounter for screening for malignant neoplasm of colon: Secondary | ICD-10-CM | POA: Diagnosis not present

## 2020-01-28 MED ORDER — CLINDAMYCIN PHOSPHATE 1 % EX GEL: Freq: Two times a day (BID) | CUTANEOUS | 0 refills | Status: DC

## 2020-01-28 MED ORDER — LOSARTAN POTASSIUM 50 MG PO TABS: 100.0000 mg | ORAL_TABLET | Freq: Every day | ORAL | 0 refills | Status: DC

## 2020-01-28 NOTE — Addendum Note (Signed)
Addended by: Steele Sizer F on: 01/28/2020 02:06 PM   Modules accepted: Orders

## 2020-02-07 ENCOUNTER — Encounter: Payer: Self-pay | Admitting: General Practice

## 2020-03-24 ENCOUNTER — Ambulatory Visit: Payer: BC Managed Care – PPO | Admitting: Family Medicine

## 2020-04-09 NOTE — Progress Notes (Signed)
Name: Kristopher Davis   MRN: [id number]    DOB: [birth year]   Date:04/10/2020       Progress Note  Subjective  Chief Complaint  Acute visit/Runny nose with blood streaks  HPI  HTN he is still only taking one losartan 50 mg per day, he never doubled the dose, explained importance of increasing the dose to decrease long term complications of uncontrolled HTN. He will return for bp check with CMA in one week, we may need to add HCTZ to get bp below 140/90. No chest pain or palpitation  Atherosclerosis of aorta: found on CT abdomen back in 2019, refuses statin therapy   Epistaxis: from right side only, has intermittent rhinorrhea, when he blows his nose it has a little bit of blood, but stops with pressure. It happens two to three times a week. No headaches, no eye problems on either side, never seen by ENT  Dermatitis: seen by Dr. Marolyn Hammock back in 2019 with the same rash, had a biopsy. 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 A photo related dermatitis cannot be excluded. The biopsy does not have characteristic changes of psoriasis or pityriasis rubra pilaris. No atypical lymphocytes are found. If the patient's condition persists or worsens additional biopsy may be helpful in further characterizing the process.  At the time he responded to Triamcinolone, he states applying the cream but not resolving, symptoms started about two months ago, it is itchy at night. We will add vistaril at night and referral back to Dr. Marolyn Hammock    Patient Active Problem List   Diagnosis Date Noted  . Nasal inflammation 12/04/2019  . Epistaxis 12/04/2019  . Erectile dysfunction 12/04/2019  . Other hyperlipidemia  12/03/2019  . Aortic atherosclerosis ([place]) 08/24/2017  . Pneumonia 08/23/2017  . Elevated AST (SGOT) 07/14/2015  . Prostate cancer screening 07/09/2015  . Medication monitoring encounter 07/09/2015  . Need for hepatitis C screening test 07/09/2015  . Tinea 08/11/2014  . 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 Disease of hair and hair follicles 68/09/8108  . Benign essential HTN 01/07/2009  . Fatigue 01/18/2008  . Decreased libido 02/01/2007  . CD (contact dermatitis) 10/10/2006    Past Surgical History:  Procedure Laterality Date  . LUMBAR LAMINECTOMY     3 surgeries    Family History  Problem Relation Age of Onset  . Cancer Mother   . Hypertension Sister   . Anemia Daughter     Social History   Tobacco Use  . Smoking status: Former Smoker    Packs/day: 0.25    Years: 12.00    Pack years: 3.00    Types: Cigarettes    Quit date: 12/24/2019    Years since quitting: 0.2  . Smokeless tobacco: Former Systems developer    Quit date: 08/21/2017  Substance Use Topics  . Alcohol use: Yes    Alcohol/week: 2.0 standard drinks    Types: 2 Cans of beer per week     Current Outpatient Medications:  .  aspirin EC 81 MG tablet, Take 1 tablet (81 mg total) by mouth daily., Disp: 30 tablet, Rfl: 11 .  clindamycin (CLINDAGEL) 1 % gel, Apply topically 2 (two) times daily., Disp: 30 g, Rfl: 0 .  hydrOXYzine (ATARAX/VISTARIL) 10 MG tablet,  Take 1-2 tablets (10-20 mg total) by mouth at bedtime., Disp: 90 tablet, Rfl: 0 .  losartan (COZAAR) 50 MG tablet, Take 2 tablets (100 mg total) by mouth daily. Take two, Disp: 30 tablet, Rfl: 0 .  sildenafil (VIAGRA) 100 MG tablet, Take 0.5-1 tablets (50-100 mg total) by mouth daily as needed for erectile dysfunction., Disp: 30 tablet, Rfl: 1  Allergies  Allergen Reactions  . Levofloxacin In D5w     hallucinations    I personally reviewed active problem list, medication list, allergies, family history, social history, health maintenance with the patient/caregiver  today.   ROS  Constitutional: Negative for fever or weight change.  Respiratory: Negative for cough and shortness of breath.   Cardiovascular: Negative for chest pain or palpitations.  Gastrointestinal: Negative for abdominal pain, no bowel changes.  Musculoskeletal: Negative for gait problem or joint swelling.  Skin: positive  for rash.  Neurological: Negative for dizziness or headache.  No other specific complaints in a complete review of systems (except as listed in HPI above).  Objective  Vitals:   04/10/20 0738 04/10/20 0745  BP: (!) 156/98 (!) 154/96  Pulse: 96   Resp: 16   Temp: 98.2 F (36.8 C)   TempSrc: Oral   SpO2: 98%   Weight: 220 lb 6.4 oz (100 kg)   Height: 6\' 5"  (1.956 m)     Body mass index is 26.14 kg/m.  Physical Exam  Constitutional: Patient appears well-developed and well-nourished. No distress.  HEENT: head atraumatic, normocephalic, pupils equal and reactive to light, eneck supple Cardiovascular: Normal rate, regular rhythm and normal heart sounds.  No murmur heard. No BLE edema. Pulmonary/Chest: Effort normal and breath sounds normal. No respiratory distress. Abdominal: Soft.  There is no tenderness. Skin: rash on trunk is violaceous with some desquamation  Psychiatric: Patient has a normal mood and affect. behavior is normal. Judgment and thought content normal.   PHQ2/9: Depression screen [facility] 2/9 04/10/2020 01/28/2020 12/04/2019 05/21/2019 04/19/2019  Decreased Interest 0 0 0 0 0  Down, Depressed, Hopeless 0 0 0 0 0  PHQ - 2 Score 0 0 0 0 0  Altered sleeping - - - 0 0  Tired, decreased energy - - - 0 0  Change in appetite - - - 0 0  Feeling bad or failure about yourself  - - - 0 0  Trouble concentrating - - - 0 0  Moving slowly or fidgety/restless - - - 0 0  Suicidal thoughts - - - 0 0  PHQ-9 Score - - - 0 0  Difficult doing work/chores - - - - Not difficult at all  Some recent data might be hidden    phq 9 is negative   Fall  Risk: Fall Risk  04/10/2020 01/28/2020 12/04/2019 05/21/2019 04/19/2019  Falls in the past year? 0 0 0 0 0  Number falls in past yr: 0 0 0 0 0  Injury with Fall? 0 0 0 0 0     Assessment & Plan  1. Benign essential HTN  Take 2 losartan 50 mg daily and return in one week for bp check   2. Aortic atherosclerosis (HCC)  - Lipid panel  3. Recurrent epistaxis  - Ambulatory referral to ENT  4. Long-term use of high-risk medication  - COMPLETE METABOLIC PANEL WITH GFR - CBC with Differential/Platelet  5. Colon cancer screening  - Fecal Globin By Immunochemistry  6. Rash  - Ambulatory referral to Dermatology - C-reactive protein - Sedimentation rate -  hydrOXYzine (ATARAX/VISTARIL) 10 MG tablet; Take 1-2 tablets (10-20 mg total) by mouth at bedtime.  Dispense: 90 tablet; Refill: 0  7. Pruritus  - hydrOXYzine (ATARAX/VISTARIL) 10 MG tablet; Take 1-2 tablets (10-20 mg total) by mouth at bedtime.  Dispense: 90 tablet; Refill: 0

## 2020-04-10 ENCOUNTER — Encounter: Payer: Self-pay | Admitting: Family Medicine

## 2020-04-10 ENCOUNTER — Other Ambulatory Visit: Payer: Self-pay

## 2020-04-10 ENCOUNTER — Ambulatory Visit: Payer: BC Managed Care – PPO | Admitting: Family Medicine

## 2020-04-10 VITALS — BP 154/96 | HR 96 | Temp 98.2°F | Resp 16 | Ht 77.0 in | Wt 220.4 lb

## 2020-04-10 DIAGNOSIS — Z1211 Encounter for screening for malignant neoplasm of colon: Secondary | ICD-10-CM

## 2020-04-10 DIAGNOSIS — R040 Epistaxis: Secondary | ICD-10-CM | POA: Diagnosis not present

## 2020-04-10 DIAGNOSIS — I10 Essential (primary) hypertension: Secondary | ICD-10-CM

## 2020-04-10 DIAGNOSIS — I700 Atherosclerosis of aorta: Secondary | ICD-10-CM | POA: Diagnosis not present

## 2020-04-10 DIAGNOSIS — R21 Rash and other nonspecific skin eruption: Secondary | ICD-10-CM

## 2020-04-10 DIAGNOSIS — Z79899 Other long term (current) drug therapy: Secondary | ICD-10-CM | POA: Diagnosis not present

## 2020-04-10 DIAGNOSIS — L299 Pruritus, unspecified: Secondary | ICD-10-CM

## 2020-04-10 MED ORDER — HYDROXYZINE HCL 10 MG PO TABS: 10.0000 mg | ORAL_TABLET | Freq: Every day | ORAL | 0 refills | Status: DC

## 2020-04-11 ENCOUNTER — Other Ambulatory Visit: Payer: Self-pay | Admitting: Family Medicine

## 2020-04-11 DIAGNOSIS — N529 Male erectile dysfunction, unspecified: Secondary | ICD-10-CM

## 2020-04-17 ENCOUNTER — Ambulatory Visit: Payer: BC Managed Care – PPO

## 2020-04-17 ENCOUNTER — Other Ambulatory Visit: Payer: Self-pay

## 2020-04-17 VITALS — BP 140/88

## 2020-04-17 DIAGNOSIS — Z0130 Encounter for examination of blood pressure without abnormal findings: Secondary | ICD-10-CM

## 2020-04-29 ENCOUNTER — Other Ambulatory Visit: Payer: Self-pay | Admitting: Family Medicine

## 2020-04-29 DIAGNOSIS — I10 Essential (primary) hypertension: Secondary | ICD-10-CM

## 2020-05-06 ENCOUNTER — Other Ambulatory Visit: Payer: Self-pay

## 2020-05-06 ENCOUNTER — Ambulatory Visit: Payer: BC Managed Care – PPO | Admitting: Family Medicine

## 2020-05-06 ENCOUNTER — Encounter: Payer: Self-pay | Admitting: Family Medicine

## 2020-05-06 VITALS — BP 148/94 | HR 90 | Temp 98.2°F | Resp 16 | Ht 77.0 in | Wt 218.0 lb

## 2020-05-06 DIAGNOSIS — R21 Rash and other nonspecific skin eruption: Secondary | ICD-10-CM | POA: Diagnosis not present

## 2020-05-06 DIAGNOSIS — Z730 Burn-out: Secondary | ICD-10-CM

## 2020-05-06 DIAGNOSIS — I10 Essential (primary) hypertension: Secondary | ICD-10-CM

## 2020-05-06 DIAGNOSIS — R000 Tachycardia, unspecified: Secondary | ICD-10-CM | POA: Diagnosis not present

## 2020-05-06 DIAGNOSIS — I700 Atherosclerosis of aorta: Secondary | ICD-10-CM | POA: Diagnosis not present

## 2020-05-06 DIAGNOSIS — Z79899 Other long term (current) drug therapy: Secondary | ICD-10-CM | POA: Diagnosis not present

## 2020-05-06 MED ORDER — ATENOLOL 25 MG PO TABS: 25.0000 mg | ORAL_TABLET | Freq: Every day | ORAL | 0 refills | Status: DC

## 2020-05-06 NOTE — Progress Notes (Signed)
Name: Kristopher Davis   MRN: [id number]    DOB: 07/31/1955   Date:05/06/2020       Progress Note  Subjective  Chief Complaint  Consult FMLA  HPI  He has been working for the past 18 years for the same company, but it was bought out by another company about one year ago. They are short staffed, he has been working in Sales executive. He was working 12 hours 7 days a week for over one year Since less Fall he is down to 8 hours per day, but he needs to do the work for extra people. The supervisors are new in the job and he feels liable to the work he is doing for the people that has not been trained. He does not like getting up in the mornings, always comes home stressed, bp is spiking , feeling tired all the time, lack of motivation, he is second guessing what he does at work, not sleeping well.   Patient Active Problem List   Diagnosis Date Noted  . Nasal inflammation 12/04/2019  . Epistaxis 12/04/2019  . Erectile dysfunction 12/04/2019  . Other hyperlipidemia 12/03/2019  . Aortic atherosclerosis ([place]) 08/24/2017  . Pneumonia 08/23/2017  . Elevated AST (SGOT) 07/14/2015  . Prostate cancer screening 07/09/2015  . Medication monitoring encounter 07/09/2015  . Need for hepatitis C screening test 07/09/2015  . Tinea 08/11/2014  . 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 Disease of hair and hair follicles 42/68/3419  . Benign essential HTN 01/07/2009  . Fatigue 01/18/2008  . Decreased libido 02/01/2007  . CD (contact dermatitis) 10/10/2006    Past Surgical History:  Procedure Laterality Date  . LUMBAR LAMINECTOMY     3 surgeries    Family History  Problem Relation Age of Onset  . Cancer Mother   . Hypertension Sister   . Anemia Daughter     Social History   Tobacco Use  . Smoking status: Former Smoker    Packs/day: 0.25    Years: 12.00    Pack years: 3.00    Types: Cigarettes    Quit date: 12/24/2019    Years since quitting: 0.3  . Smokeless tobacco: Former Systems developer    Quit date: 08/21/2017   Substance Use Topics  . Alcohol use: Yes    Alcohol/week: 2.0 standard drinks    Types: 2 Cans of beer per week     Current Outpatient Medications:  .  aspirin EC 81 MG tablet, Take 1 tablet (81 mg total) by mouth daily., Disp: 30 tablet, Rfl: 11 .  clindamycin (CLINDAGEL) 1 % gel, Apply topically 2 (two) times daily., Disp: 30 g, Rfl: 0 .  hydrOXYzine (ATARAX/VISTARIL) 10 MG tablet, Take 1-2 tablets (10-20 mg total) by mouth at bedtime., Disp: 90 tablet, Rfl: 0 .  losartan (COZAAR) 100 MG tablet, Take 1 tablet (100 mg total) by mouth daily., Disp: 90 tablet, Rfl: 0 .  sildenafil (VIAGRA) 100 MG tablet, TAKE 0.5-1 TABLETS BY MOUTH DAILY AS NEEDED FOR ERECTILE DYSFUNCTION, Disp: 30 tablet, Rfl: 1  Allergies  Allergen Reactions  . Levofloxacin In D5w     hallucinations    I personally reviewed active problem list, medication list, allergies, family history, social history, health maintenance with the patient/caregiver today.   ROS  Constitutional: Negative for fever or weight change.  Respiratory: Negative for cough and shortness of breath.   Cardiovascular: Negative for chest pain or palpitations.  Gastrointestinal: Negative for abdominal pain, no bowel changes.  Musculoskeletal: Negative for gait problem or  joint swelling.  Skin: Negative for rash.  Neurological: Negative for dizziness or headache.  No other specific complaints in a complete review of systems (except as listed in HPI above).   Objective  Vitals:   05/06/20 1343 05/06/20 1352  BP: (!) 150/102 (!) 148/94  Pulse: 90   Resp: 16   Temp: 98.2 F (36.8 C)   TempSrc: Oral   SpO2: 99%   Weight: 218 lb (98.9 kg)   Height: 6\' 5"  (1.956 m)     Body mass index is 25.85 kg/m.  Physical Exam  Constitutional: Patient appears well-developed and well-nourished.  No distress.  HEENT: head atraumatic, normocephalic, pupils equal and reactive to light,  neck supple Cardiovascular: Normal rate, regular rhythm and  normal heart sounds.  No murmur heard. No BLE edema. Pulmonary/Chest: Effort normal and breath sounds normal. No respiratory distress. Abdominal: Soft.  There is no tenderness. Psychiatric: Patient has a normal mood and affect. behavior is normal. Judgment and thought content normal.  PHQ2/9: Depression screen [facility] 2/9 05/06/2020 04/10/2020 01/28/2020 12/04/2019 05/21/2019  Decreased Interest 1 0 0 0 0  Down, Depressed, Hopeless 2 0 0 0 0  PHQ - 2 Score 3 0 0 0 0  Altered sleeping 3 - - - 0  Tired, decreased energy 3 - - - 0  Change in appetite 3 - - - 0  Feeling bad or failure about yourself  0 - - - 0  Trouble concentrating 1 - - - 0  Moving slowly or fidgety/restless 0 - - - 0  Suicidal thoughts 0 - - - 0  PHQ-9 Score 13 - - - 0  Difficult doing work/chores - - - - -  Some recent data might be hidden    phq 9 is positive    Fall Risk: Fall Risk  05/06/2020 04/10/2020 01/28/2020 12/04/2019 05/21/2019  Falls in the past year? 0 0 0 0 0  Number falls in past yr: 0 0 0 0 0  Injury with Fall? 0 0 0 0 0    Functional Status Survey: Is the patient deaf or have difficulty hearing?: Yes Does the patient have difficulty seeing, even when wearing glasses/contacts?: No Does the patient have difficulty concentrating, remembering, or making decisions?: No Does the patient have difficulty walking or climbing stairs?: No Does the patient have difficulty dressing or bathing?: No Does the patient have difficulty doing errands alone such as visiting a doctor's office or shopping?: No    Assessment & Plan  1. Benign essential HTN  - atenolol (TENORMIN) 25 MG tablet; Take 1 tablet (25 mg total) by mouth daily.  Dispense: 90 tablet; Refill: 0  2. Burn-out  We will give him time off work, explained he must see a therapist to implement changes prior to go back to work   3. Tachycardia  - TSH

## 2020-05-07 LAB — COMPLETE METABOLIC PANEL WITH GFR
AG Ratio: 1.4 (calc) (ref 1.0–2.5)
ALT: 29 U/L (ref 9–46)
AST: 48 U/L — ABNORMAL HIGH (ref 10–35)
Albumin: 3.9 g/dL (ref 3.6–5.1)
Alkaline phosphatase (APISO): 55 U/L (ref 35–144)
BUN: 11 mg/dL (ref 7–25)
CO2: 25 mmol/L (ref 20–32)
Calcium: 8.7 mg/dL (ref 8.6–10.3)
Chloride: 109 mmol/L (ref 98–110)
Creat: 1.13 mg/dL (ref 0.70–1.25)
GFR, Est African American: 79 mL/min/{1.73_m2} (ref >=60)
GFR, Est Non African American: 68 mL/min/{1.73_m2} (ref >=60)
Globulin: 2.7 g/dL (calc) (ref 1.9–3.7)
Glucose, Bld: 74 mg/dL (ref 65–99)
Potassium: 4.5 mmol/L (ref 3.5–5.3)
Sodium: 145 mmol/L (ref 135–146)
Total Bilirubin: 0.7 mg/dL (ref 0.2–1.2)
Total Protein: 6.6 g/dL (ref 6.1–8.1)

## 2020-05-07 LAB — CBC WITH DIFFERENTIAL/PLATELET
Absolute Monocytes: 357 cells/uL (ref 200–950)
Basophils Absolute: 19 cells/uL (ref 0–200)
Basophils Relative: 0.4 %
Eosinophils Absolute: 38 cells/uL (ref 15–500)
Eosinophils Relative: 0.8 %
HCT: 39.2 % (ref 38.5–50.0)
Hemoglobin: 13.6 g/dL (ref 13.2–17.1)
Lymphs Abs: 1147 cells/uL (ref 850–3900)
MCH: 33.5 pg — ABNORMAL HIGH (ref 27.0–33.0)
MCHC: 34.7 g/dL (ref 32.0–36.0)
MCV: 96.6 fL (ref 80.0–100.0)
MPV: 9.5 fL (ref 7.5–12.5)
Monocytes Relative: 7.6 %
Neutro Abs: 3140 cells/uL (ref 1500–7800)
Neutrophils Relative %: 66.8 %
Platelets: 227 10*3/uL (ref 140–400)
RBC: 4.06 10*6/uL — ABNORMAL LOW (ref 4.20–5.80)
RDW: 13 % (ref 11.0–15.0)
Total Lymphocyte: 24.4 %
WBC: 4.7 10*3/uL (ref 3.8–10.8)

## 2020-05-07 LAB — SEDIMENTATION RATE: Sed Rate: 9 mm/h (ref 0–20)

## 2020-05-07 LAB — LIPID PANEL
Cholesterol: 161 mg/dL (ref <200)
HDL: 98 mg/dL (ref >=40)
LDL Cholesterol (Calc): 48 mg/dL (calc)
Non-HDL Cholesterol (Calc): 63 mg/dL (calc) (ref <130)
Total CHOL/HDL Ratio: 1.6 (calc) (ref <5.0)
Triglycerides: 72 mg/dL (ref <150)

## 2020-05-07 LAB — C-REACTIVE PROTEIN: CRP: 6.8 mg/L (ref <8.0)

## 2020-05-07 LAB — TSH: TSH: 0.97 mIU/L (ref 0.40–4.50)

## 2020-05-14 DIAGNOSIS — R21 Rash and other nonspecific skin eruption: Secondary | ICD-10-CM | POA: Diagnosis not present

## 2020-05-22 DIAGNOSIS — R000 Tachycardia, unspecified: Secondary | ICD-10-CM | POA: Diagnosis not present

## 2020-05-22 DIAGNOSIS — I700 Atherosclerosis of aorta: Secondary | ICD-10-CM | POA: Diagnosis not present

## 2020-05-22 DIAGNOSIS — R21 Rash and other nonspecific skin eruption: Secondary | ICD-10-CM | POA: Diagnosis not present

## 2020-05-22 DIAGNOSIS — Z1211 Encounter for screening for malignant neoplasm of colon: Secondary | ICD-10-CM | POA: Diagnosis not present

## 2020-05-26 LAB — FECAL GLOBIN BY IMMUNOCHEMISTRY
FECAL GLOBIN RESULT:: NOT DETECTED
MICRO NUMBER:: 11699461
SPECIMEN QUALITY:: ADEQUATE

## 2020-06-16 ENCOUNTER — Ambulatory Visit: Payer: BC Managed Care – PPO | Admitting: Family Medicine

## 2020-06-16 ENCOUNTER — Encounter: Payer: Self-pay | Admitting: Family Medicine

## 2020-06-16 ENCOUNTER — Other Ambulatory Visit: Payer: Self-pay

## 2020-06-16 VITALS — BP 134/82 | HR 84 | Temp 98.2°F | Resp 16 | Ht 77.0 in | Wt 220.0 lb

## 2020-06-16 DIAGNOSIS — G4700 Insomnia, unspecified: Secondary | ICD-10-CM | POA: Diagnosis not present

## 2020-06-16 DIAGNOSIS — F09 Unspecified mental disorder due to known physiological condition: Secondary | ICD-10-CM | POA: Diagnosis not present

## 2020-06-16 DIAGNOSIS — Z730 Burn-out: Secondary | ICD-10-CM

## 2020-06-16 DIAGNOSIS — F341 Dysthymic disorder: Secondary | ICD-10-CM

## 2020-06-16 MED ORDER — TRAZODONE HCL 50 MG PO TABS: 25.0000 mg | ORAL_TABLET | Freq: Every evening | ORAL | 0 refills | Status: DC | PRN

## 2020-06-16 NOTE — Progress Notes (Signed)
Name: Kristopher Davis   MRN: [id number]    DOB: 03-Jun-1955   Date:06/16/2020       Progress Note  Subjective  Chief Complaint  Follow  Up  HPI  HTN bp is at goal, taking medication as prescribed , no chest pain or palpitation   Dysthymia/insomnia: He has been working for the past 18 years for the same company, but it was bought out by another company about in 2021.He came in in March with his wife because she noticed he was not acting your normal self.   He does not like getting up in the mornings, always comes home stressed, bp is spiking , feeling tired all the time, lack of motivation, he is second guessing what he does at work, not sleeping well. He states it started due to change in supervisors, being short staffed, he was doing the work for 4 people. He started to make mistakes at work because he was feeling overwhelmed and unable to keep up. He did not get reprimanded  at work but he felt like he could not keep up. I filled out short term disability forms and advised him to see therapist so he could get help before going back to work. He did not want to start medications. He states since he has been off he still has difficulty getting up early in am, still feels down, forgetful - sometimes forgets to take clothes out of the dryer, goes outside to get something from storage building but gets distracted and forgets what he was outside for. He has difficulty falling and staying asleep. He was walking outside during the night and his wife changed the alarm code because she was getting worried about him. Initially I thought it was burn out, but now seems more depression and possible mild cognitive impairment. 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 and after that went to [employer] - He was in the [employer] for 3 years from 1979 until 1982 - he said no concussions or injury while in the [employer]  Patient Active Problem List   Diagnosis Date Noted  . Nasal inflammation 12/04/2019  . Epistaxis 12/04/2019  . Erectile dysfunction 12/04/2019  . Other hyperlipidemia 12/03/2019  . Aortic atherosclerosis ([place]) 08/24/2017  . Pneumonia 08/23/2017  . Elevated AST (SGOT) 07/14/2015  . Prostate cancer screening 07/09/2015  . Medication monitoring encounter 07/09/2015  . Need for hepatitis C screening test 07/09/2015  . Tinea 08/11/2014  . 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 Disease of hair and hair follicles 50/35/4656  . Benign essential HTN 01/07/2009  . Fatigue 01/18/2008  . Decreased libido 02/01/2007  . CD (contact dermatitis) 10/10/2006    Past Surgical History:  Procedure Laterality Date  . LUMBAR LAMINECTOMY     3 surgeries    Family History  Problem Relation Age of Onset  . Cancer Mother   . Hypertension Sister   . Anemia Daughter     Social History   Tobacco Use  . Smoking status: Former Smoker    Packs/day: 0.25    Years: 12.00    Pack years: 3.00    Types: Cigarettes    Quit date: 12/24/2019  Years since quitting: 0.4  . Smokeless tobacco: Former Systems developer    Quit date: 08/21/2017  Substance Use Topics  . Alcohol use: Yes    Alcohol/week: 2.0 standard drinks    Types: 2 Cans of beer per week     Current Outpatient Medications:  .  aspirin EC 81 MG tablet, Take 1 tablet (81 mg total) by mouth daily., Disp: 30 tablet, Rfl: 11 .  atenolol (TENORMIN) 25 MG tablet, Take 1 tablet (25 mg total) by mouth daily., Disp: 90 tablet, Rfl: 0 .  clindamycin (CLINDAGEL) 1 % gel, Apply topically 2 (two) times daily., Disp: 30 g, Rfl: 0 .  clobetasol ointment (TEMOVATE) 0.05 %, Apply topically., Disp: , Rfl:  .  hydrOXYzine (ATARAX/VISTARIL) 10 MG tablet, Take 1-2 tablets (10-20 mg total) by mouth at bedtime., Disp: 90 tablet, Rfl:  0 .  losartan (COZAAR) 100 MG tablet, Take 1 tablet (100 mg total) by mouth daily., Disp: 90 tablet, Rfl: 0 .  sildenafil (VIAGRA) 100 MG tablet, TAKE 0.5-1 TABLETS BY MOUTH DAILY AS NEEDED FOR ERECTILE DYSFUNCTION, Disp: 30 tablet, Rfl: 1  Allergies  Allergen Reactions  . Levofloxacin In D5w     hallucinations    I personally reviewed active problem list, medication list, allergies, family history, social history, health maintenance with the patient/caregiver today.   ROS  Ten systems reviewed and is negative except as mentioned in HPI   Objective  Vitals:   06/16/20 1444  BP: 134/82  Pulse: 84  Resp: 16  Temp: 98.2 F (36.8 C)  TempSrc: Oral  SpO2: 99%  Weight: 220 lb (99.8 kg)  Height: 6\' 5"  (1.956 m)    Body mass index is 26.09 kg/m.  Physical Exam  Constitutional: Patient appears well-developed and well-nourished.  No distress.  HEENT: head atraumatic, normocephalic, pupils equal and reactive to light,  neck supple Cardiovascular: Normal rate, regular rhythm and normal heart sounds.  No murmur heard. No BLE edema. Pulmonary/Chest: Effort normal and breath sounds normal. No respiratory distress. Abdominal: Soft.  There is no tenderness. Psychiatric: Patient has a normal mood and affect. behavior is normal. Judgment and thought content normal. Neurological: no focal deficits   Recent Results (from the past 2160 hour(s))  Lipid panel     Status: None   Collection Time: 05/06/20  2:31 PM  Result Value Ref Range   Cholesterol 161 <200 mg/dL   HDL 98 > OR = 40 mg/dL   Triglycerides 72 <150 mg/dL   LDL Cholesterol (Calc) 48 mg/dL (calc)    Comment: Reference range: <100 . Desirable range <100 mg/dL for primary prevention;   <70 mg/dL for patients with CHD or diabetic patients  with > or = 2 CHD risk factors. Marland Kitchen LDL-C is now calculated using the Martin-Hopkins  calculation, which is a validated novel method providing  better accuracy than the Friedewald  equation in the  estimation of LDL-C.  Cresenciano Genre et al. Annamaria Helling. 1572;620(35): 2061-2068  (http://education.[facility].com/faq/FAQ164)    Total CHOL/HDL Ratio 1.6 <5.0 (calc)   Non-HDL Cholesterol (Calc) 63 <130 mg/dL (calc)    Comment: For patients with diabetes plus 1 major ASCVD risk  factor, treating to a non-HDL-C goal of <100 mg/dL  (LDL-C of <70 mg/dL) is considered a therapeutic  option.   COMPLETE METABOLIC PANEL WITH GFR     Status: Abnormal   Collection Time: 05/06/20  2:31 PM  Result Value Ref Range   Glucose, Bld 74 65 - 99 mg/dL    Comment: .  Fasting reference interval .    BUN 11 7 - 25 mg/dL   Creat 1.13 0.70 - 1.25 mg/dL    Comment: For patients >72 years of age, the reference limit for Creatinine is approximately 13% higher for people identified as African-American. .    GFR, Est Non African American 68 > OR = 60 mL/min/1.61m2   GFR, Est African American 79 > OR = 60 mL/min/1.53m2   BUN/Creatinine Ratio NOT APPLICABLE 6 - 22 (calc)   Sodium 145 135 - 146 mmol/L   Potassium 4.5 3.5 - 5.3 mmol/L   Chloride 109 98 - 110 mmol/L   CO2 25 20 - 32 mmol/L   Calcium 8.7 8.6 - 10.3 mg/dL   Total Protein 6.6 6.1 - 8.1 g/dL   Albumin 3.9 3.6 - 5.1 g/dL   Globulin 2.7 1.9 - 3.7 g/dL (calc)   AG Ratio 1.4 1.0 - 2.5 (calc)   Total Bilirubin 0.7 0.2 - 1.2 mg/dL   Alkaline phosphatase (APISO) 55 35 - 144 U/L   AST 48 (H) 10 - 35 U/L   ALT 29 9 - 46 U/L  CBC with Differential/Platelet     Status: Abnormal   Collection Time: 05/06/20  2:31 PM  Result Value Ref Range   WBC 4.7 3.8 - 10.8 Thousand/uL   RBC 4.06 (L) 4.20 - 5.80 Million/uL   Hemoglobin 13.6 13.2 - 17.1 g/dL   HCT 39.2 38.5 - 50.0 %   MCV 96.6 80.0 - 100.0 fL   MCH 33.5 (H) 27.0 - 33.0 pg   MCHC 34.7 32.0 - 36.0 g/dL   RDW 13.0 11.0 - 15.0 %   Platelets 227 140 - 400 Thousand/uL   MPV 9.5 7.5 - 12.5 fL   Neutro Abs 3,140 1,500 - 7,800 cells/uL   Lymphs Abs 1,147 850 - 3,900 cells/uL    Absolute Monocytes 357 200 - 950 cells/uL   Eosinophils Absolute 38 15 - 500 cells/uL   Basophils Absolute 19 0 - 200 cells/uL   Neutrophils Relative % 66.8 %   Total Lymphocyte 24.4 %   Monocytes Relative 7.6 %   Eosinophils Relative 0.8 %   Basophils Relative 0.4 %  C-reactive protein     Status: None   Collection Time: 05/06/20  2:31 PM  Result Value Ref Range   CRP 6.8 <8.0 mg/L  Sedimentation rate     Status: None   Collection Time: 05/06/20  2:31 PM  Result Value Ref Range   Sed Rate 9 0 - 20 mm/h  TSH     Status: None   Collection Time: 05/06/20  2:31 PM  Result Value Ref Range   TSH 0.97 0.40 - 4.50 mIU/L  Fecal Globin By Immunochemistry     Status: None   Collection Time: 05/22/20 12:00 AM  Result Value Ref Range   MICRO NUMBER: [id number]    SPECIMEN QUALITY: Adequate    Source: INSURE (TM) FOBT TEST CARD    STATUS: FINAL    FECAL GLOBIN RESULT: Not Detected      PHQ2/9: Depression screen [facility] 2/9 06/16/2020 05/06/2020 04/10/2020 01/28/2020 12/04/2019  Decreased Interest 1 1 0 0 0  Down, Depressed, Hopeless 1 2 0 0 0  PHQ - 2 Score 2 3 0 0 0  Altered sleeping 0 3 - - -  Tired, decreased energy 3 3 - - -  Change in appetite 3 3 - - -  Feeling bad or failure about yourself  0 0 - - -  Trouble concentrating 0 1 - - -  Moving slowly or fidgety/restless 0 0 - - -  Suicidal thoughts 0 0 - - -  PHQ-9 Score 8 13 - - -  Difficult doing work/chores - - - - -  Some recent data might be hidden    phq 9 is positive   Fall Risk: Fall Risk  06/16/2020 05/06/2020 04/10/2020 01/28/2020 12/04/2019  Falls in the past year? 0 0 0 0 0  Number falls in past yr: 0 0 0 0 0  Injury with Fall? 0 0 0 0 0     Functional Status Survey: Is the patient deaf or have difficulty hearing?: No Does the patient have difficulty seeing, even when wearing glasses/contacts?: No Does the patient have difficulty concentrating, remembering, or making decisions?: No Does the patient have difficulty  walking or climbing stairs?: No Does the patient have difficulty dressing or bathing?: No Does the patient have difficulty doing errands alone such as visiting a doctor's office or shopping?: No    Assessment & Plan  1. Dysthymia  - Ambulatory referral to Psychiatry  2. Burn-out  - Ambulatory referral to Psychiatry  3. Cognitive dysfunction  - B12 and Folate Panel - Vitamin B1  4. Insomnia, unspecified type  - traZODone (DESYREL) 50 MG tablet; Take 0.5-1 tablets (25-50 mg total) by mouth at bedtime as needed for sleep.  Dispense: 30 tablet; Refill: 0

## 2020-06-18 DIAGNOSIS — F09 Unspecified mental disorder due to known physiological condition: Secondary | ICD-10-CM | POA: Diagnosis not present

## 2020-06-18 DIAGNOSIS — Z730 Burn-out: Secondary | ICD-10-CM | POA: Diagnosis not present

## 2020-06-18 DIAGNOSIS — F341 Dysthymic disorder: Secondary | ICD-10-CM | POA: Diagnosis not present

## 2020-06-18 DIAGNOSIS — G4700 Insomnia, unspecified: Secondary | ICD-10-CM | POA: Diagnosis not present

## 2020-06-21 LAB — B12 AND FOLATE PANEL
Folate: 4.7 ng/mL — ABNORMAL LOW
Vitamin B-12: 226 pg/mL (ref 200–1100)

## 2020-06-21 LAB — VITAMIN B1: Vitamin B1 (Thiamine): 7 nmol/L — ABNORMAL LOW (ref 8–30)

## 2020-07-08 ENCOUNTER — Ambulatory Visit: Payer: BC Managed Care – PPO | Admitting: Family Medicine

## 2020-07-08 ENCOUNTER — Other Ambulatory Visit: Payer: Self-pay

## 2020-07-08 ENCOUNTER — Encounter: Payer: Self-pay | Admitting: Family Medicine

## 2020-07-08 VITALS — BP 136/84 | HR 92 | Temp 98.2°F | Resp 16 | Ht 77.0 in | Wt 217.0 lb

## 2020-07-08 DIAGNOSIS — I10 Essential (primary) hypertension: Secondary | ICD-10-CM | POA: Diagnosis not present

## 2020-07-08 DIAGNOSIS — F09 Unspecified mental disorder due to known physiological condition: Secondary | ICD-10-CM | POA: Diagnosis not present

## 2020-07-08 DIAGNOSIS — F341 Dysthymic disorder: Secondary | ICD-10-CM | POA: Diagnosis not present

## 2020-07-08 DIAGNOSIS — I700 Atherosclerosis of aorta: Secondary | ICD-10-CM

## 2020-07-08 DIAGNOSIS — G4700 Insomnia, unspecified: Secondary | ICD-10-CM

## 2020-07-08 DIAGNOSIS — E519 Thiamine deficiency, unspecified: Secondary | ICD-10-CM

## 2020-07-08 DIAGNOSIS — E538 Deficiency of other specified B group vitamins: Secondary | ICD-10-CM

## 2020-07-08 MED ORDER — LOSARTAN POTASSIUM 100 MG PO TABS: 100.0000 mg | ORAL_TABLET | Freq: Every day | ORAL | 0 refills | Status: DC

## 2020-07-08 MED ORDER — TRAZODONE HCL 100 MG PO TABS: 100.0000 mg | ORAL_TABLET | Freq: Every evening | ORAL | 0 refills | Status: DC | PRN

## 2020-07-08 MED ORDER — VITAMIN B-1 50 MG PO TABS: 50.0000 mg | ORAL_TABLET | Freq: Every day | ORAL | 0 refills | Status: DC

## 2020-07-08 MED ORDER — FOLIC ACID 1 MG PO TABS: 1.0000 mg | ORAL_TABLET | Freq: Every day | ORAL | 1 refills | Status: DC

## 2020-07-08 MED ORDER — ATENOLOL 25 MG PO TABS: 25.0000 mg | ORAL_TABLET | Freq: Every day | ORAL | 0 refills | Status: DC

## 2020-07-08 NOTE — Progress Notes (Signed)
Name: Kristopher Davis   MRN: [id number]    DOB: 1956/02/16   Date:07/08/2020       Progress Note  Subjective  Chief Complaint  Follow Up  HPI   Dysthymia/insomnia: He has been working for the past 18 years for the same company, but it was bought out by another company about in 2021.He came in in March 22  with his wife because she noticed he was not acting his normal self. He was not getting up in the mornings, he was coming home very stressed from work, bp was  spiking , feeling tired all the time, lack of motivation, he was  second guessing what he was doing at work, not sleeping well at night. He states it started due to change in supervisors, being short staffed, he was doing the work for 4 people. He started to make mistakes at work because he was feeling overwhelmed and unable to keep up. He did not get reprimanded  at work but he felt like he could not keep up. I filled out short term disability forms and advised him to see therapist so he could get help before going back to work. He did not want to start medications, he has been out of work since end of March 2022 . He states once he stopped work he was still feeling very tired and did not have motivation to get up in the mornings, however since April 1st, his wife retired and he started to go for walks with her, now he is walking 5 days a week, it is a speed walk for one hour. He states physical activity is helping him be more alert, however after lunch he feels tired, and has to stay busy to not fall asleep. Appetite is still poor and still does not sleep well. Gest up around 2 am and stays up for hours during the night. He has an appointment with therapist end of May , explained may also need to see a psychiatrist to be able to be assessed for when to go back to work   Cognitive dysfunction: B1 and folic acid were low, he is now taking folic acid and B1 he said does not have a history of alcoholism  HTN he is now taking losartan 100 mg and  Atenolol, no side effects, no chest pain or palpitation.   Atherosclerosis of aorta: found on CT abdomen back in 2019, refuses statin therapy. Unchanged    Dermatitis: seen by Dr. Marolyn Hammock back in 2019 with the same rash, had a biopsy. 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 A photo related dermatitis cannot be excluded. The biopsy does not have characteristic changes of psoriasis or pityriasis rubra pilaris. No atypical lymphocytes are found. If the patient's condition persists or worsens additional biopsy may be helpful in further characterizing the process.  He has a follow up with Dr. Marolyn Hammock tomorrow, rash is still present, still has triamcinolone and also hydroxyzine for itching, on right inner thigh, back, shoulders   Patient Active Problem List   Diagnosis Date Noted  . Nasal inflammation 12/04/2019  . Epistaxis 12/04/2019  . Erectile dysfunction 12/04/2019  . Other hyperlipidemia 12/03/2019  . Aortic atherosclerosis ([place]) 08/24/2017  . Pneumonia 08/23/2017  .  Elevated AST (SGOT) 07/14/2015  . Prostate cancer screening 07/09/2015  . Medication monitoring encounter 07/09/2015  . Need for hepatitis C screening test 07/09/2015  . Tinea 08/11/2014  . 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 Disease of hair and hair follicles 43/32/9518  . Benign essential HTN 01/07/2009  . Fatigue 01/18/2008  . Decreased libido 02/01/2007  . CD (contact dermatitis) 10/10/2006    Past Surgical History:  Procedure Laterality Date  . LUMBAR LAMINECTOMY     3 surgeries    Family History  Problem Relation Age of Onset  . Cancer Mother   . Hypertension Sister   . Anemia Daughter     Social History   Tobacco  Use  . Smoking status: Former Smoker    Packs/day: 0.25    Years: 12.00    Pack years: 3.00    Types: Cigarettes    Quit date: 12/24/2019    Years since quitting: 0.5  . Smokeless tobacco: Former Systems developer    Quit date: 08/21/2017  Substance Use Topics  . Alcohol use: Yes    Alcohol/week: 2.0 standard drinks    Types: 2 Cans of beer per week     Current Outpatient Medications:  .  aspirin EC 81 MG tablet, Take 1 tablet (81 mg total) by mouth daily., Disp: 30 tablet, Rfl: 11 .  atenolol (TENORMIN) 25 MG tablet, Take 1 tablet (25 mg total) by mouth daily., Disp: 90 tablet, Rfl: 0 .  clindamycin (CLINDAGEL) 1 % gel, Apply topically 2 (two) times daily., Disp: 30 g, Rfl: 0 .  clobetasol ointment (TEMOVATE) 0.05 %, Apply topically., Disp: , Rfl:  .  hydrOXYzine (ATARAX/VISTARIL) 10 MG tablet, Take 1-2 tablets (10-20 mg total) by mouth at bedtime., Disp: 90 tablet, Rfl: 0 .  losartan (COZAAR) 100 MG tablet, Take 1 tablet (100 mg total) by mouth daily., Disp: 90 tablet, Rfl: 0 .  sildenafil (VIAGRA) 100 MG tablet, TAKE 0.5-1 TABLETS BY MOUTH DAILY AS NEEDED FOR ERECTILE DYSFUNCTION, Disp: 30 tablet, Rfl: 1 .  traZODone (DESYREL) 50 MG tablet, Take 0.5-1 tablets (25-50 mg total) by mouth at bedtime as needed for sleep., Disp: 30 tablet, Rfl: 0  Allergies  Allergen Reactions  . Levofloxacin In D5w     hallucinations    I personally reviewed active problem list, medication list, allergies, family history, social history with the patient/caregiver today.   ROS  Ten systems reviewed and is negative except as mentioned in HPI   Objective  Vitals:   07/08/20 1535  BP: 136/84  Pulse: 92  Resp: 16  Temp: 98.2 F (36.8 C)  TempSrc: Oral  SpO2: 99%  Weight: 217 lb (98.4 kg)  Height: 6\' 5"  (1.956 m)    Body mass index is 25.73 kg/m.  Physical Exam  Constitutional: Patient appears well-developed and well-nourished. No distress.  HEENT: head atraumatic, normocephalic, pupils equal  and reactive to light,neck supple Cardiovascular: Normal rate, regular rhythm and normal heart sounds.  No murmur heard. No BLE edema. Pulmonary/Chest: Effort normal and breath sounds normal. No respiratory distress. Abdominal: Soft.  There is no tenderness. Psychiatric: Patient has a normal mood and affect. behavior is normal. Judgment and thought content normal. Skin: hyperpigmented rash   Recent Results (from the past 2160 hour(s))  Lipid panel     Status: None   Collection Time: 05/06/20  2:31 PM  Result Value Ref Range   Cholesterol 161 <200 mg/dL   HDL 98 > OR = 40 mg/dL   Triglycerides 72 <150 mg/dL  LDL Cholesterol (Calc) 48 mg/dL (calc)    Comment: Reference range: <100 . Desirable range <100 mg/dL for primary prevention;   <70 mg/dL for patients with CHD or diabetic patients  with > or = 2 CHD risk factors. Marland Kitchen LDL-C is now calculated using the Martin-Hopkins  calculation, which is a validated novel method providing  better accuracy than the Friedewald equation in the  estimation of LDL-C.  Cresenciano Genre et al. Annamaria Helling. 0867;619(50): 2061-2068  (http://education.[facility].com/faq/FAQ164)    Total CHOL/HDL Ratio 1.6 <5.0 (calc)   Non-HDL Cholesterol (Calc) 63 <130 mg/dL (calc)    Comment: For patients with diabetes plus 1 major ASCVD risk  factor, treating to a non-HDL-C goal of <100 mg/dL  (LDL-C of <70 mg/dL) is considered a therapeutic  option.   COMPLETE METABOLIC PANEL WITH GFR     Status: Abnormal   Collection Time: 05/06/20  2:31 PM  Result Value Ref Range   Glucose, Bld 74 65 - 99 mg/dL    Comment: .            Fasting reference interval .    BUN 11 7 - 25 mg/dL   Creat 1.13 0.70 - 1.25 mg/dL    Comment: For patients >14 years of age, the reference limit for Creatinine is approximately 13% higher for people identified as African-American. .    GFR, Est Non African American 68 > OR = 60 mL/min/1.50m2   GFR, Est African American 79 > OR = 60  mL/min/1.44m2   BUN/Creatinine Ratio NOT APPLICABLE 6 - 22 (calc)   Sodium 145 135 - 146 mmol/L   Potassium 4.5 3.5 - 5.3 mmol/L   Chloride 109 98 - 110 mmol/L   CO2 25 20 - 32 mmol/L   Calcium 8.7 8.6 - 10.3 mg/dL   Total Protein 6.6 6.1 - 8.1 g/dL   Albumin 3.9 3.6 - 5.1 g/dL   Globulin 2.7 1.9 - 3.7 g/dL (calc)   AG Ratio 1.4 1.0 - 2.5 (calc)   Total Bilirubin 0.7 0.2 - 1.2 mg/dL   Alkaline phosphatase (APISO) 55 35 - 144 U/L   AST 48 (H) 10 - 35 U/L   ALT 29 9 - 46 U/L  CBC with Differential/Platelet     Status: Abnormal   Collection Time: 05/06/20  2:31 PM  Result Value Ref Range   WBC 4.7 3.8 - 10.8 Thousand/uL   RBC 4.06 (L) 4.20 - 5.80 Million/uL   Hemoglobin 13.6 13.2 - 17.1 g/dL   HCT 39.2 38.5 - 50.0 %   MCV 96.6 80.0 - 100.0 fL   MCH 33.5 (H) 27.0 - 33.0 pg   MCHC 34.7 32.0 - 36.0 g/dL   RDW 13.0 11.0 - 15.0 %   Platelets 227 140 - 400 Thousand/uL   MPV 9.5 7.5 - 12.5 fL   Neutro Abs 3,140 1,500 - 7,800 cells/uL   Lymphs Abs 1,147 850 - 3,900 cells/uL   Absolute Monocytes 357 200 - 950 cells/uL   Eosinophils Absolute 38 15 - 500 cells/uL   Basophils Absolute 19 0 - 200 cells/uL   Neutrophils Relative % 66.8 %   Total Lymphocyte 24.4 %   Monocytes Relative 7.6 %   Eosinophils Relative 0.8 %   Basophils Relative 0.4 %  C-reactive protein     Status: None   Collection Time: 05/06/20  2:31 PM  Result Value Ref Range   CRP 6.8 <8.0 mg/L  Sedimentation rate     Status: None   Collection Time:  05/06/20  2:31 PM  Result Value Ref Range   Sed Rate 9 0 - 20 mm/h  TSH     Status: None   Collection Time: 05/06/20  2:31 PM  Result Value Ref Range   TSH 0.97 0.40 - 4.50 mIU/L  Fecal Globin By Immunochemistry     Status: None   Collection Time: 05/22/20 12:00 AM  Result Value Ref Range   MICRO NUMBER: [id number]    SPECIMEN QUALITY: Adequate    Source: INSURE (TM) FOBT TEST CARD    STATUS: FINAL    FECAL GLOBIN RESULT: Not Detected   B12 and Folate Panel      Status: Abnormal   Collection Time: 06/18/20  2:03 PM  Result Value Ref Range   Vitamin B-12 226 200 - 1,100 pg/mL    Comment: . Please Note: Although the reference range for vitamin B12 is [phone] pg/mL, it has been reported that between 5 and 10% of patients with values between 200 and 400 pg/mL may experience neuropsychiatric and hematologic abnormalities due to occult B12 deficiency; less than 1% of patients with values above 400 pg/mL will have symptoms. .    Folate 4.7 (L) ng/mL    Comment:                            Reference Range                            Low:           <3.4                            Borderline:    3.4-5.4                            Normal:        >5.4 .   Vitamin B1     Status: Abnormal   Collection Time: 06/18/20  2:03 PM  Result Value Ref Range   Vitamin B1 (Thiamine) 7 (L) 8 - 30 nmol/L    Comment: Marland Kitchen Vitamin supplementation within 24 hours prior to blood draw may affect the accuracy of the results. . This test was developed and its analytical performance characteristics have been determined by [place], [place]. It has not been cleared or approved by the U.S. Food and Drug Administration. This assay has been validated pursuant to the CLIA regulations and is used for clinical purposes. .       PHQ2/9: Depression screen [facility] 2/9 07/08/2020 06/16/2020 05/06/2020 04/10/2020 01/28/2020  Decreased Interest 3 1 1  0 0  Down, Depressed, Hopeless 1 1 2  0 0  PHQ - 2 Score 4 2 3  0 0  Altered sleeping 1 0 3 - -  Tired, decreased energy 2 3 3  - -  Change in appetite 3 3 3  - -  Feeling bad or failure about yourself  0 0 0 - -  Trouble concentrating 0 0 1 - -  Moving slowly or fidgety/restless 0 0 0 - -  Suicidal thoughts 0 0 0 - -  PHQ-9 Score 10 8 13  - -  Difficult doing work/chores - - - - -  Some recent data might be hidden    phq 9 is positive   Fall Risk: Fall Risk  07/08/2020 06/16/2020  05/06/2020 04/10/2020  01/28/2020  Falls in the past year? 0 0 0 0 0  Number falls in past yr: 0 0 0 0 0  Injury with Fall? 0 0 0 0 0      Functional Status Survey: Is the patient deaf or have difficulty hearing?: Yes Does the patient have difficulty seeing, even when wearing glasses/contacts?: No Does the patient have difficulty concentrating, remembering, or making decisions?: Yes Does the patient have difficulty walking or climbing stairs?: No Does the patient have difficulty dressing or bathing?: No Does the patient have difficulty doing errands alone such as visiting a doctor's office or shopping?: No   Assessment & Plan  1. Dysthymia  - Ambulatory referral to Psychiatry  2. Benign essential HTN  - losartan (COZAAR) 100 MG tablet; Take 1 tablet (100 mg total) by mouth daily.  Dispense: 90 tablet; Refill: 0 - atenolol (TENORMIN) 25 MG tablet; Take 1 tablet (25 mg total) by mouth daily.  Dispense: 90 tablet; Refill: 0  3. Aortic atherosclerosis ([place])   4. Cognitive dysfunction   5. Folic acid deficiency  - folic acid (FOLVITE) 1 MG tablet; Take 1 tablet (1 mg total) by mouth daily.  Dispense: 90 tablet; Refill: 1  6. Vitamin B1 deficiency  - thiamine (VITAMIN B-1) 50 MG tablet; Take 1 tablet (50 mg total) by mouth daily.  Dispense: 90 tablet; Refill: 0  7. Insomnia, unspecified type  - Ambulatory referral to Psychiatry - traZODone (DESYREL) 100 MG tablet; Take 1 tablet (100 mg total) by mouth at bedtime as needed for sleep.  Dispense: 30 tablet; Refill: 0

## 2020-07-09 DIAGNOSIS — R21 Rash and other nonspecific skin eruption: Secondary | ICD-10-CM | POA: Diagnosis not present

## 2020-07-11 ENCOUNTER — Other Ambulatory Visit: Payer: Self-pay | Admitting: Family Medicine

## 2020-07-11 DIAGNOSIS — R21 Rash and other nonspecific skin eruption: Secondary | ICD-10-CM

## 2020-07-11 DIAGNOSIS — I10 Essential (primary) hypertension: Secondary | ICD-10-CM

## 2020-07-11 DIAGNOSIS — G4700 Insomnia, unspecified: Secondary | ICD-10-CM

## 2020-07-11 DIAGNOSIS — L299 Pruritus, unspecified: Secondary | ICD-10-CM

## 2020-07-11 NOTE — Telephone Encounter (Signed)
Requested Prescriptions  Pending Prescriptions Disp Refills  . hydrOXYzine (ATARAX/VISTARIL) 10 MG tablet [Pharmacy Med Name: HYDROXYZINE HCL 10MG  TABLETS] 90 tablet 0    Sig: TAKE 1 TO 2 TABLETS(10 TO 20 MG) BY MOUTH AT BEDTIME     Ear, Nose, and Throat:  Antihistamines Passed - 07/11/2020  7:05 AM      Passed - Valid encounter within last 12 months    Recent Outpatient Visits          3 days ago [facility] Steele Sizer, MD   3 weeks ago [facility] Steele Sizer, MD   2 months ago Benign essential HTN   [facility] Steele Sizer, MD   3 months ago Aortic atherosclerosis [facility])   [facility] Steele Sizer, MD   5 months ago Benign essential HTN   [facility], Drue Stager, MD             . losartan (COZAAR) 100 MG tablet [Pharmacy Med Name: LOSARTAN 100MG  TABLETS] 90 tablet     Sig: TAKE 1 TABLET(100 MG) BY MOUTH DAILY     Cardiovascular:  Angiotensin Receptor Blockers Passed - 07/11/2020  7:05 AM      Passed - Cr in normal range and within 180 days    Creat  Date Value Ref Range Status  05/06/2020 1.13 0.70 - 1.25 mg/dL Final    Comment:    For patients >65 years of age, the reference limit for Creatinine is approximately 13% higher for people identified as African-American. .          Passed - K in normal range and within 180 days    Potassium  Date Value Ref Range Status  05/06/2020 4.5 3.5 - 5.3 mmol/L Final         Passed - Patient is not pregnant      Passed - Last BP in normal range    BP Readings from Last 1 Encounters:  07/08/20 136/84         Passed - Valid encounter within last 6 months    Recent Outpatient Visits          3 days ago [facility] Steele Sizer, MD   3 weeks ago [facility] Steele Sizer, MD   2 months ago Benign essential HTN    [facility] Steele Sizer, MD   3 months ago Aortic atherosclerosis [facility])   [facility] Steele Sizer, MD   5 months ago Benign essential HTN   [facility] [place], Drue Stager, MD             . traZODone (DESYREL) 50 MG tablet [Pharmacy Med Name: TRAZODONE 50MG  TABLETS] 30 tablet     Sig: TAKE 1/2 TO 1 TABLET(25 TO 50 MG) BY MOUTH AT BEDTIME AS NEEDED FOR SLEEP     Psychiatry: Antidepressants - Serotonin Modulator Passed - 07/11/2020  7:05 AM      Passed - Valid encounter within last 6 months    Recent Outpatient Visits          3 days ago [facility] Steele Sizer, MD   3 weeks ago [facility] Steele Sizer, MD   2 months ago Benign essential HTN   [facility] Steele Sizer, MD  3 months ago Aortic atherosclerosis [facility])   [facility] Steele Sizer, MD   5 months ago Benign essential HTN   [facility] Steele Sizer, MD

## 2020-07-20 DIAGNOSIS — F4323 Adjustment disorder with mixed anxiety and depressed mood: Secondary | ICD-10-CM | POA: Diagnosis not present

## 2020-07-21 ENCOUNTER — Other Ambulatory Visit: Payer: Self-pay | Admitting: Family Medicine

## 2020-07-21 DIAGNOSIS — F09 Unspecified mental disorder due to known physiological condition: Secondary | ICD-10-CM

## 2020-07-21 NOTE — Progress Notes (Signed)
Patient states: [phone] is his best contact number and he would prefer to stay in the [facility] area for care.

## 2020-08-05 ENCOUNTER — Encounter: Payer: Self-pay | Admitting: Family Medicine

## 2020-08-10 ENCOUNTER — Other Ambulatory Visit: Payer: Self-pay | Admitting: Family Medicine

## 2020-08-10 DIAGNOSIS — L299 Pruritus, unspecified: Secondary | ICD-10-CM

## 2020-08-10 DIAGNOSIS — R21 Rash and other nonspecific skin eruption: Secondary | ICD-10-CM

## 2020-08-17 ENCOUNTER — Telehealth: Payer: Self-pay

## 2020-08-17 NOTE — Telephone Encounter (Signed)
Looks like [place] faxed it on 08/10/2020 or 08/11/2020. She is currently OOO will touch base to verify when she returns on the 27th. Thanks

## 2020-08-17 NOTE — Telephone Encounter (Signed)
Copied from [place] [id number]. Topic: General - Other >> Aug 17, 2020 11:36 AM Tessa Lerner A wrote: Reason for CRM: Patient would like to be contacted regarding paperwork related to their short term disability   The patient shares that the paperwork was initially faxed to the office 08/07/20  The patient has plans to return to work on 09/07/20   The patient would like to be updated on the status of the paperwork and it's completion   Please contact when possible

## 2020-09-09 ENCOUNTER — Other Ambulatory Visit: Payer: Self-pay | Admitting: Family Medicine

## 2020-09-09 DIAGNOSIS — L299 Pruritus, unspecified: Secondary | ICD-10-CM

## 2020-09-09 DIAGNOSIS — R21 Rash and other nonspecific skin eruption: Secondary | ICD-10-CM

## 2020-09-09 DIAGNOSIS — I10 Essential (primary) hypertension: Secondary | ICD-10-CM

## 2020-11-24 ENCOUNTER — Encounter: Payer: Self-pay | Admitting: Family Medicine

## 2020-11-24 ENCOUNTER — Ambulatory Visit (INDEPENDENT_AMBULATORY_CARE_PROVIDER_SITE_OTHER): Payer: Medicare Other | Admitting: Family Medicine

## 2020-11-24 ENCOUNTER — Other Ambulatory Visit: Payer: Self-pay

## 2020-11-24 VITALS — BP 138/88 | HR 91 | Temp 98.2°F | Resp 16 | Ht 76.0 in | Wt 217.0 lb

## 2020-11-24 DIAGNOSIS — L309 Dermatitis, unspecified: Secondary | ICD-10-CM

## 2020-11-24 DIAGNOSIS — I700 Atherosclerosis of aorta: Secondary | ICD-10-CM

## 2020-11-24 DIAGNOSIS — E538 Deficiency of other specified B group vitamins: Secondary | ICD-10-CM | POA: Diagnosis not present

## 2020-11-24 DIAGNOSIS — I10 Essential (primary) hypertension: Secondary | ICD-10-CM | POA: Diagnosis not present

## 2020-11-24 DIAGNOSIS — Z23 Encounter for immunization: Secondary | ICD-10-CM

## 2020-11-24 DIAGNOSIS — E519 Thiamine deficiency, unspecified: Secondary | ICD-10-CM

## 2020-11-24 DIAGNOSIS — F09 Unspecified mental disorder due to known physiological condition: Secondary | ICD-10-CM

## 2020-11-24 DIAGNOSIS — R21 Rash and other nonspecific skin eruption: Secondary | ICD-10-CM

## 2020-11-24 MED ORDER — LOSARTAN POTASSIUM 100 MG PO TABS: 100.0000 mg | ORAL_TABLET | Freq: Every day | ORAL | 1 refills | Status: DC

## 2020-11-24 MED ORDER — ATENOLOL 25 MG PO TABS
25.0000 mg | ORAL_TABLET | Freq: Every day | ORAL | 1 refills | Status: DC
Start: 2020-11-24 — End: 2021-08-19

## 2020-11-24 MED ORDER — DESONIDE 0.05 % EX CREA: TOPICAL_CREAM | Freq: Two times a day (BID) | CUTANEOUS | 0 refills | Status: DC

## 2020-11-24 NOTE — Progress Notes (Signed)
Name: Kristopher Davis   MRN: [id number]    DOB: 01-27-1956   Date:11/24/2020       Progress Note  Subjective  Chief Complaint  Medication Refill  HPI  Dysthymia/insomnia: He has been working for the past 18 years for the same company, but it was bought out by another company about in 2021.He came in in March 22  with his wife because she noticed he was not acting his normal self. He was not getting up in the mornings, he was coming home very stressed from work, bp was  spiking , feeling tired all the time, lack of motivation, he was  second guessing what he was doing at work, not sleeping well at night.  He states it started due to change in supervisors, being short staffed, he was doing the work for 4 people. He started to make mistakes at work because he was feeling overwhelmed and unable to keep up. He did not get reprimanded  at work but he felt like he could not keep up. I filled out short term disability forms and advised him to see therapist so he could get help before going back to work. He did not want to start medications, he has been out of work since end of March 2022 . He states once he stopped work he was still feeling very tired and did not have motivation to get up in the mornings, however since April 1st, his wife retired and he started to go for walks with her. He quit his job August 2022. He is feeling well, he is not going back to psychiatrist. He goes to bed around 9:30 pm and  still wakes up around 2 -3am and  watches TV ,he falls back asleep around 4 am and gets out of bed around 7:30 and goes for a walk about 3 days a week. He feels great   Cognitive dysfunction: B1 and folic acid were low, he has not been taking supplementations, explained importance of resuming it today   HTN he is now taking losartan 100 mg but stopped  Atenolol on his own - bp towards high end of normal and he agrees on resuming therapy, no chest pain or palpitation.   Atherosclerosis of aorta: found on CT  abdomen back in 2019, refuses statin therapy. Unchanged.   Dermatitis: seen by Dr. Marolyn Hammock back in 2019 with the same rash, had a biopsy. Results below  Right flank, punch - Delicate parakeratosis, mild spongiosis and slight basal layer vacuolization, see comment Electronically signed by Jonn Shingles, MD on 08/01/2017 at [id number] Comment The mixed pattern of spongiosis and vacuolar change to the basal layer suggests a drug reaction. 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 A photo related dermatitis cannot be excluded. The biopsy does not have characteristic changes of psoriasis or pityriasis rubra pilaris. No atypical lymphocytes are found. If the patient's condition persists or worsens additional biopsy may be helpful in further characterizing the process.  He went back to see Dr. Marolyn Hammock back in May but forgot to show his facial rash, he has been using Cerave otc, and it helps with itching, it is bumpy but not using any steroids on his face. Per Dr. Marolyn Hammock nummular dermatitis vs psoriasis.   Patient Active Problem List   Diagnosis Date Noted   Nasal inflammation 12/04/2019   Epistaxis 12/04/2019   Erectile dysfunction 12/04/2019   Other hyperlipidemia 12/03/2019   Aortic atherosclerosis ([place]) 08/24/2017   Pneumonia 08/23/2017   Elevated AST (SGOT)  07/14/2015   Prostate cancer screening 07/09/2015   Medication monitoring encounter 07/09/2015   Need for hepatitis C screening test 07/09/2015   Tinea 08/11/2014   Lipoma 10/28/2009   Disease of hair and hair follicles 03/54/6568   Benign essential HTN 01/07/2009   Fatigue 01/18/2008   Decreased libido 02/01/2007   CD (contact dermatitis) 10/10/2006    Past Surgical History:  Procedure Laterality Date   LUMBAR LAMINECTOMY     3 surgeries    Family History  Problem Relation Age of Onset   Cancer Mother    Hypertension Sister    Anemia Daughter     Social History   Tobacco Use   Smoking status: Former    Packs/day:  0.25    Years: 12.00    Pack years: 3.00    Types: Cigarettes    Quit date: 12/24/2019    Years since quitting: 0.9   Smokeless tobacco: Former    Quit date: 08/21/2017  Substance Use Topics   Alcohol use: Yes    Alcohol/week: 2.0 standard drinks    Types: 2 Cans of beer per week     Current Outpatient Medications:    aspirin EC 81 MG tablet, Take 1 tablet (81 mg total) by mouth daily., Disp: 30 tablet, Rfl: 11   losartan (COZAAR) 100 MG tablet, Take 1 tablet (100 mg total) by mouth daily., Disp: 90 tablet, Rfl: 0   sildenafil (VIAGRA) 100 MG tablet, TAKE 0.5-1 TABLETS BY MOUTH DAILY AS NEEDED FOR ERECTILE DYSFUNCTION, Disp: 30 tablet, Rfl: 1   atenolol (TENORMIN) 25 MG tablet, TAKE 1 TABLET BY MOUTH EVERY DAY (Patient not taking: Reported on 11/24/2020), Disp: 90 tablet, Rfl: 0   clindamycin (CLINDAGEL) 1 % gel, Apply topically 2 (two) times daily. (Patient not taking: Reported on 11/24/2020), Disp: 30 g, Rfl: 0   clobetasol ointment (TEMOVATE) 0.05 %, Apply topically. (Patient not taking: Reported on 11/24/2020), Disp: , Rfl:    folic acid (FOLVITE) 1 MG tablet, Take 1 tablet (1 mg total) by mouth daily. (Patient not taking: Reported on 11/24/2020), Disp: 90 tablet, Rfl: 1   hydrOXYzine (ATARAX/VISTARIL) 10 MG tablet, TAKE 1 TO 2 TABLETS(10 TO 20 MG) BY MOUTH AT BEDTIME (Patient not taking: Reported on 11/24/2020), Disp: 90 tablet, Rfl: 0   thiamine (VITAMIN B-1) 50 MG tablet, Take 1 tablet (50 mg total) by mouth daily. (Patient not taking: Reported on 11/24/2020), Disp: 90 tablet, Rfl: 0   traZODone (DESYREL) 100 MG tablet, Take 1 tablet (100 mg total) by mouth at bedtime as needed for sleep. (Patient not taking: Reported on 11/24/2020), Disp: 30 tablet, Rfl: 0  Allergies  Allergen Reactions   Levofloxacin In D5w     hallucinations    I personally reviewed active problem list, medication list, allergies, family history, social history, health maintenance with the patient/caregiver  today.   ROS  Constitutional: Negative for fever or weight change.  Respiratory: Negative for cough and shortness of breath.   Cardiovascular: Negative for chest pain or palpitations.  Gastrointestinal: Negative for abdominal pain, no bowel changes.  Musculoskeletal: Negative for gait problem or joint swelling.  Skin:positive  for rash.  Neurological: Negative for dizziness or headache.  No other specific complaints in a complete review of systems (except as listed in HPI above).   Objective  Vitals:   11/24/20 1010  BP: 138/88  Pulse: 91  Resp: 16  Temp: 98.2 F (36.8 C)  SpO2: 99%  Weight: 217 lb (98.4 kg)  Height:  6\' 4"  (1.93 m)    Body mass index is 26.41 kg/m.  Physical Exam  Constitutional: Patient appears well-developed and well-nourished.  No distress.  HEENT: head atraumatic, normocephalic, pupils equal and reactive to light, neck supple Cardiovascular: Normal rate, regular rhythm and normal heart sounds.  No murmur heard. No BLE edema. Pulmonary/Chest: Effort normal and breath sounds normal. No respiratory distress. Abdominal: Soft.  There is no tenderness. Psychiatric: Patient has a normal mood and affect. behavior is normal. Judgment and thought content normal.  Skin: bumpy rash on bridge of nose and forehead   PHQ2/9: Depression screen [facility] 2/9 11/24/2020 07/08/2020 06/16/2020 05/06/2020 04/10/2020  Decreased Interest 0 3 1 1  0  Down, Depressed, Hopeless 0 1 1 2  0  PHQ - 2 Score 0 4 2 3  0  Altered sleeping - 1 0 3 -  Tired, decreased energy - 2 3 3  -  Change in appetite - 3 3 3  -  Feeling bad or failure about yourself  - 0 0 0 -  Trouble concentrating - 0 0 1 -  Moving slowly or fidgety/restless - 0 0 0 -  Suicidal thoughts - 0 0 0 -  PHQ-9 Score - 10 8 13  -  Difficult doing work/chores - - - - -  Some recent data might be hidden    phq 9 is negative   Fall Risk: Fall Risk  11/24/2020 07/08/2020 06/16/2020 05/06/2020 04/10/2020  Falls in the past year? 0  0 0 0 0  Number falls in past yr: 0 0 0 0 0  Injury with Fall? 0 0 0 0 0  Risk for fall due to : No Fall Risks - - - -  Follow up Falls prevention discussed - - - -      Functional Status Survey: Is the patient deaf or have difficulty hearing?: Yes Does the patient have difficulty seeing, even when wearing glasses/contacts?: No Does the patient have difficulty concentrating, remembering, or making decisions?: No Does the patient have difficulty walking or climbing stairs?: No Does the patient have difficulty dressing or bathing?: No Does the patient have difficulty doing errands alone such as visiting a doctor's office or shopping?: No    Assessment & Plan  1. Facial rash  - desonide (DESOWEN) 0.05 % cream; Apply topically 2 (two) times daily. For your face  Dispense: 30 g; Refill: 0  2. Chronic dermatitis  - desonide (DESOWEN) 0.05 % cream; Apply topically 2 (two) times daily. For your face  Dispense: 30 g; Refill: 0  3. Vitamin B1 deficiency  Discussed resuming supplementation   4. Folate deficiency  Resume supplementation   5. B12 deficiency  Resume supplementation   6. Aortic atherosclerosis (HCC)  Refuses statin therapy   7. Benign essential HTN  - losartan (COZAAR) 100 MG tablet; Take 1 tablet (100 mg total) by mouth daily.  Dispense: 90 tablet; Refill: 1 - atenolol (TENORMIN) 25 MG tablet; Take 1 tablet (25 mg total) by mouth daily.  Dispense: 90 tablet; Refill: 1  8. Cognitive dysfunction    9. Need for Tdap vaccination  Refused   10. Need for shingles vaccine   Refused

## 2021-07-18 ENCOUNTER — Other Ambulatory Visit: Payer: Self-pay | Admitting: Family Medicine

## 2021-07-18 DIAGNOSIS — I10 Essential (primary) hypertension: Secondary | ICD-10-CM

## 2021-07-20 ENCOUNTER — Other Ambulatory Visit: Payer: Self-pay | Admitting: Family Medicine

## 2021-07-20 DIAGNOSIS — I10 Essential (primary) hypertension: Secondary | ICD-10-CM

## 2021-07-20 NOTE — Telephone Encounter (Signed)
I have switched him over to your schedule for that day

## 2021-07-20 NOTE — Telephone Encounter (Signed)
You where completely booked therefore I scheduled him with Almyra Free for June 22nd

## 2021-08-18 NOTE — Progress Notes (Signed)
Name: Kristopher Davis   MRN: [id number]    DOB: November 06, 1955   Date:08/19/2021       Progress Note  Subjective  Chief Complaint  Medication Refill  HPI  Dysthymia/insomnia: He has been working for the past 18 years for the same company, but it was bought out by another company about in 2021.He came in in March 22  with his wife because she noticed he was not acting his normal self. He was not getting up in the mornings, he was coming home very stressed from work, bp was  spiking , feeling tired all the time, lack of motivation, he was  second guessing what he was doing at work, not sleeping well at night.  He states it started due to change in supervisors, being short staffed, he was doing the work for 4 people. He started to make mistakes at work because he was feeling overwhelmed and unable to keep up. He did not get reprimanded  at work but he felt like he could not keep up. I filled out short term disability forms and advised him to see therapist so he could get help before going back to work. He did not want to start medications. . He quit his job August 2022. He is feeling well, he is not going back to psychiatrist. He is working part time now, he is sleeping a little better, about 6 hours per night .   Cognitive dysfunction: B1 and folic acid were low, he has not been taking supplementations, explained importance of resuming it today . He is working part time now   Radiculitis: he states initially was a burning sensation on posterior thigh, that has resolved, but now when he goes to bad at night he noticed a burning under right foot, resolves when he removes sheets. It is not every night. He has a history of back surgery but no pain on his back.   HTN he is now taking losartan 100 mg but stopped  Atenolol on his own - bp is at goal today, continue medication. No chest pain or palpitation   Atherosclerosis of aorta: found on CT abdomen back in 2019, refuses statin therapy. We will recheck labs     Dermatitis: he had another flare on his face last year, seen by a Dermatologist in [place] and is using topical medication, we will try to get the records    seen by Dr. Marolyn Hammock back in 2019 with the same rash, had a biopsy. Results below  Right flank, punch - Delicate parakeratosis, mild spongiosis and slight basal layer vacuolization, see comment Electronically signed by Jonn Shingles, MD on 08/01/2017 at [phone] Comment The mixed pattern of spongiosis and vacuolar change to the basal layer suggests a drug reaction. 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 A photo related dermatitis cannot be excluded. The biopsy does not have characteristic changes of psoriasis or pityriasis rubra pilaris. No atypical lymphocytes are found. If the patient's condition persists or worsens additional biopsy may be helpful in further characterizing the process.    Patient Active Problem List   Diagnosis Date Noted   Cognitive dysfunction 08/19/2021   Vitamin B1 deficiency 08/19/2021   Folate deficiency 08/19/2021   Dysthymia 08/19/2021   Radiculitis of leg 08/19/2021   Erectile dysfunction 12/04/2019   Other hyperlipidemia 12/03/2019   Aortic atherosclerosis ([place]) 08/24/2017   Medication monitoring encounter 07/09/2015   Tinea 08/11/2014   Lipoma 10/28/2009   Disease of hair and hair follicles 99/37/1696  Benign essential HTN 01/07/2009   Decreased libido 02/01/2007   CD (contact dermatitis) 10/10/2006    Past Surgical History:  Procedure Laterality Date   LUMBAR LAMINECTOMY     3 surgeries    Family History  Problem Relation Age of Onset   Cancer Mother    Hypertension Sister    Anemia Daughter     Social History   Tobacco Use   Smoking status: Former    Packs/day: 0.25    Years: 12.00    Total pack years: 3.00    Types: Cigarettes    Quit date: 12/24/2019    Years since quitting: 1.6   Smokeless tobacco: Former    Quit date: 08/21/2017  Substance Use Topics    Alcohol use: Yes    Alcohol/week: 2.0 standard drinks of alcohol    Types: 2 Cans of beer per week     Current Outpatient Medications:    aspirin EC 81 MG tablet, Take 1 tablet (81 mg total) by mouth daily., Disp: 30 tablet, Rfl: 11   Multiple Vitamin (MULTIVITAMIN) tablet, Take 1 tablet by mouth 2 (two) times daily., Disp: , Rfl:    losartan (COZAAR) 100 MG tablet, TAKE 1 TABLET(100 MG) BY MOUTH DAILY, Disp: 90 tablet, Rfl: 1   sildenafil (VIAGRA) 100 MG tablet, TAKE 0.5-1 TABLETS BY MOUTH DAILY AS NEEDED FOR ERECTILE DYSFUNCTION, Disp: 30 tablet, Rfl: 1  Allergies  Allergen Reactions   Levofloxacin In D5w     hallucinations    I personally reviewed active problem list, medication list, allergies, family history, social history, health maintenance with the patient/caregiver today.   ROS  Constitutional: Negative for fever or weight change.  Respiratory: Negative for cough and shortness of breath.   Cardiovascular: Negative for chest pain or palpitations.  Gastrointestinal: Negative for abdominal pain, no bowel changes.  Musculoskeletal: Negative for gait problem or joint swelling.  Skin: positive  for rash.  Neurological: Negative for dizziness or headache.  No other specific complaints in a complete review of systems (except as listed in HPI above).   Objective  Vitals:   08/19/21 0826  BP: 136/88  Pulse: 96  Resp: 16  SpO2: 100%  Weight: 216 lb (98 kg)  Height: '6\' 4"'$  (1.93 m)    Body mass index is 26.29 kg/m.  Physical Exam  Constitutional: Patient appears well-developed and well-nourished.  No distress.  HEENT: head atraumatic, normocephalic, pupils equal and reactive to light, neck supple, throat within normal limits Cardiovascular: Normal rate, regular rhythm and normal heart sounds.  No murmur heard. No BLE edema. Pulmonary/Chest: Effort normal and breath sounds normal. No respiratory distress. Abdominal: Soft.  There is no tenderness. Psychiatric: Patient  has a normal mood and affect. behavior is normal. Judgment and thought content normal.    PHQ2/9:    08/19/2021    8:25 AM 11/24/2020   10:12 AM 07/08/2020    3:35 PM 06/16/2020    2:45 PM 05/06/2020    1:43 PM  Depression screen PHQ 2/9  Decreased Interest 0 0 '3 1 1  '$ Down, Depressed, Hopeless 0 0 '1 1 2  '$ PHQ - 2 Score 0 0 '4 2 3  '$ Altered sleeping 0  1 0 3  Tired, decreased energy 0  '2 3 3  '$ Change in appetite 0  '3 3 3  '$ Feeling bad or failure about yourself  0  0 0 0  Trouble concentrating 0  0 0 1  Moving slowly or fidgety/restless 0  0 0  0  Suicidal thoughts 0  0 0 0  PHQ-9 Score 0  '10 8 13    '$ phq 9 is negative   Fall Risk:    08/19/2021    8:25 AM 11/24/2020   10:12 AM 07/08/2020    3:29 PM 06/16/2020    2:44 PM 05/06/2020    1:43 PM  Fall Risk   Falls in the past year? 0 0 0 0 0  Number falls in past yr: 0 0 0 0 0  Injury with Fall? 0 0 0 0 0  Risk for fall due to : No Fall Risks No Fall Risks     Follow up Falls prevention discussed Falls prevention discussed         Functional Status Survey: Is the patient deaf or have difficulty hearing?: No Does the patient have difficulty seeing, even when wearing glasses/contacts?: No Does the patient have difficulty concentrating, remembering, or making decisions?: No Does the patient have difficulty walking or climbing stairs?: No Does the patient have difficulty dressing or bathing?: No Does the patient have difficulty doing errands alone such as visiting a doctor's office or shopping?: No    Assessment & Plan  Problem List Items Addressed This Visit     Benign essential HTN    Continue losartan       Relevant Medications   losartan (COZAAR) 100 MG tablet   sildenafil (VIAGRA) 100 MG tablet   Other Relevant Orders   COMPLETE METABOLIC PANEL WITH GFR   CBC with Differential/Platelet   Aortic atherosclerosis (HCC) - Primary    Recheck lipid panel       Relevant Medications   losartan (COZAAR) 100 MG tablet    sildenafil (VIAGRA) 100 MG tablet   Other Relevant Orders   Lipid panel   Cognitive dysfunction    Stable       Vitamin B1 deficiency    Recheck level       Relevant Orders   Vitamin B1   Folate deficiency    We will recheck labs, not taking supplementation except a MVI      Dysthymia   Radiculitis of leg    Usually at night, sometimes during the day, mild and down right lower leg , reassurance for now       Erectile dysfunction   Relevant Medications   sildenafil (VIAGRA) 100 MG tablet   Other Visit Diagnoses     Colon cancer screening       Relevant Orders   Fecal Globin By [place] [place]

## 2021-08-19 ENCOUNTER — Encounter: Payer: Self-pay | Admitting: Family Medicine

## 2021-08-19 ENCOUNTER — Ambulatory Visit (INDEPENDENT_AMBULATORY_CARE_PROVIDER_SITE_OTHER): Payer: Medicare Other | Admitting: Family Medicine

## 2021-08-19 VITALS — BP 136/88 | HR 96 | Resp 16 | Ht 76.0 in | Wt 216.0 lb

## 2021-08-19 DIAGNOSIS — I10 Essential (primary) hypertension: Secondary | ICD-10-CM | POA: Diagnosis not present

## 2021-08-19 DIAGNOSIS — E538 Deficiency of other specified B group vitamins: Secondary | ICD-10-CM

## 2021-08-19 DIAGNOSIS — Z23 Encounter for immunization: Secondary | ICD-10-CM

## 2021-08-19 DIAGNOSIS — F341 Dysthymic disorder: Secondary | ICD-10-CM | POA: Insufficient documentation

## 2021-08-19 DIAGNOSIS — M5410 Radiculopathy, site unspecified: Secondary | ICD-10-CM | POA: Insufficient documentation

## 2021-08-19 DIAGNOSIS — I700 Atherosclerosis of aorta: Secondary | ICD-10-CM

## 2021-08-19 DIAGNOSIS — Z1211 Encounter for screening for malignant neoplasm of colon: Secondary | ICD-10-CM

## 2021-08-19 DIAGNOSIS — F09 Unspecified mental disorder due to known physiological condition: Secondary | ICD-10-CM

## 2021-08-19 DIAGNOSIS — E519 Thiamine deficiency, unspecified: Secondary | ICD-10-CM | POA: Insufficient documentation

## 2021-08-19 DIAGNOSIS — N529 Male erectile dysfunction, unspecified: Secondary | ICD-10-CM

## 2021-08-19 MED ORDER — LOSARTAN POTASSIUM 100 MG PO TABS: ORAL_TABLET | ORAL | 1 refills | Status: DC

## 2021-08-19 MED ORDER — SILDENAFIL CITRATE 100 MG PO TABS: ORAL_TABLET | ORAL | 1 refills | Status: DC

## 2021-08-19 NOTE — Assessment & Plan Note (Signed)
We will recheck labs, not taking supplementation except a MVI

## 2021-08-19 NOTE — Assessment & Plan Note (Signed)
Continue losartan. 

## 2021-08-19 NOTE — Assessment & Plan Note (Signed)
Stable

## 2021-08-19 NOTE — Assessment & Plan Note (Signed)
Recheck level 

## 2021-08-19 NOTE — Assessment & Plan Note (Signed)
Recheck lipid panel 

## 2021-08-19 NOTE — Assessment & Plan Note (Signed)
Usually at night, sometimes during the day, mild and down right lower leg , reassurance for now

## 2021-08-25 LAB — B12 AND FOLATE PANEL
Folate: 10.7 ng/mL
Vitamin B-12: 221 pg/mL (ref 200–1100)

## 2021-08-25 LAB — LIPID PANEL
Cholesterol: 164 mg/dL (ref <200)
HDL: 93 mg/dL (ref >=40)
LDL Cholesterol (Calc): 59 mg/dL (calc)
Non-HDL Cholesterol (Calc): 71 mg/dL (calc) (ref <130)
Total CHOL/HDL Ratio: 1.8 (calc) (ref <5.0)
Triglycerides: 50 mg/dL (ref <150)

## 2021-08-25 LAB — COMPLETE METABOLIC PANEL WITH GFR
AG Ratio: 1.4 (calc) (ref 1.0–2.5)
ALT: 16 U/L (ref 9–46)
AST: 27 U/L (ref 10–35)
Albumin: 3.8 g/dL (ref 3.6–5.1)
Alkaline phosphatase (APISO): 61 U/L (ref 35–144)
BUN: 11 mg/dL (ref 7–25)
CO2: 26 mmol/L (ref 20–32)
Calcium: 9.1 mg/dL (ref 8.6–10.3)
Chloride: 109 mmol/L (ref 98–110)
Creat: 1.02 mg/dL (ref 0.70–1.35)
Globulin: 2.8 g/dL (calc) (ref 1.9–3.7)
Glucose, Bld: 105 mg/dL — ABNORMAL HIGH (ref 65–99)
Potassium: 4.2 mmol/L (ref 3.5–5.3)
Sodium: 145 mmol/L (ref 135–146)
Total Bilirubin: 0.6 mg/dL (ref 0.2–1.2)
Total Protein: 6.6 g/dL (ref 6.1–8.1)
eGFR: 82 mL/min/{1.73_m2} (ref >=60)

## 2021-08-25 LAB — CBC WITH DIFFERENTIAL/PLATELET
Absolute Monocytes: 421 cells/uL (ref 200–950)
Basophils Absolute: 21 cells/uL (ref 0–200)
Basophils Relative: 0.4 %
Eosinophils Absolute: 52 cells/uL (ref 15–500)
Eosinophils Relative: 1 %
HCT: 38 % — ABNORMAL LOW (ref 38.5–50.0)
Hemoglobin: 13.2 g/dL (ref 13.2–17.1)
Lymphs Abs: 1196 cells/uL (ref 850–3900)
MCH: 34.5 pg — ABNORMAL HIGH (ref 27.0–33.0)
MCHC: 34.7 g/dL (ref 32.0–36.0)
MCV: 99.2 fL (ref 80.0–100.0)
MPV: 9.6 fL (ref 7.5–12.5)
Monocytes Relative: 8.1 %
Neutro Abs: 3510 cells/uL (ref 1500–7800)
Neutrophils Relative %: 67.5 %
Platelets: 299 10*3/uL (ref 140–400)
RBC: 3.83 10*6/uL — ABNORMAL LOW (ref 4.20–5.80)
RDW: 13.1 % (ref 11.0–15.0)
Total Lymphocyte: 23 %
WBC: 5.2 10*3/uL (ref 3.8–10.8)

## 2021-08-25 LAB — VITAMIN B1: Vitamin B1 (Thiamine): 12 nmol/L (ref 8–30)

## 2021-08-30 ENCOUNTER — Ambulatory Visit: Payer: Self-pay | Admitting: *Deleted

## 2021-08-30 NOTE — Telephone Encounter (Signed)
Pt returned call and was given his lab result message from Dr. Ancil Boozer 08/30/2021 at 9:37 AM .  He prefers to take the B12 shots monthly.   He is taking the 1000 mcg pill and will continue it until he starts the shots. I let him know someone would be contacting him to set up the B12 injections.     Reason for Disposition  [1] Follow-up call to recent contact AND [2] information only call, no triage required  Answer Assessment - Initial Assessment Questions 1. REASON FOR CALL or QUESTION: "What is your reason for calling today?" or "How can I best help you?" or "What question do you have that I can help answer?"     Lab results dated 08/30/2021 at 9:37 AM from Dr. Ancil Boozer read to him.  Protocols used: Information Only Call - No Triage-A-AH

## 2021-08-30 NOTE — Telephone Encounter (Signed)
Patient called, aware he can come anytime M-F between 8am-4:30pm monthly for B-12 injection without an appointment or he is welcome to set up appointment if easier for him to remember.

## 2021-11-25 ENCOUNTER — Telehealth: Payer: Self-pay | Admitting: Family Medicine

## 2021-11-25 NOTE — Telephone Encounter (Signed)
Patient inquiring about having a prostate and colon exam and unsure if he has to go through his PCP or be referred, please advise

## 2021-11-25 NOTE — Telephone Encounter (Signed)
Lvm to inform pt that these test are normally done with CPE and if he wants to come in earlier than December he can

## 2021-11-25 NOTE — Telephone Encounter (Signed)
Please call patient and schedule a CPE if can be done w/ his ins.  This is when we do the screening he is asking for.

## 2021-11-26 NOTE — Progress Notes (Deleted)
Name: Kristopher Davis   MRN: [id number]    DOB: 10-17-1955   Date:11/26/2021       Progress Note  Subjective  Chief Complaint  Follow Up  HPI  Dysthymia/insomnia: He has been working for the past 18 years for the same company, but it was bought out by another company about in 2021.He came in in March 22  with his wife because she noticed he was not acting his normal self. He was not getting up in the mornings, he was coming home very stressed from work, bp was  spiking , feeling tired all the time, lack of motivation, he was  second guessing what he was doing at work, not sleeping well at night.  He states it started due to change in supervisors, being short staffed, he was doing the work for 4 people. He started to make mistakes at work because he was feeling overwhelmed and unable to keep up. He did not get reprimanded  at work but he felt like he could not keep up. I filled out short term disability forms and advised him to see therapist so he could get help before going back to work. He did not want to start medications. . He quit his job August 2022. He is feeling well, he is not going back to psychiatrist. He is working part time now, he is sleeping a little better, about 6 hours per night .   Cognitive dysfunction: B1 and folic acid were low, he has not been taking supplementations, explained importance of resuming it today . He is working part time now   Radiculitis: he states initially was a burning sensation on posterior thigh, that has resolved, but now when he goes to bad at night he noticed a burning under right foot, resolves when he removes sheets. It is not every night. He has a history of back surgery but no pain on his back.   HTN he is now taking losartan 100 mg but stopped  Atenolol on his own - bp is at goal today, continue medication. No chest pain or palpitation   Atherosclerosis of aorta: found on CT abdomen back in 2019, refuses statin therapy. We will recheck labs     Dermatitis: he had another flare on his face last year, seen by a Dermatologist in [place] and is using topical medication, we will try to get the records    seen by Dr. Marolyn Hammock back in 2019 with the same rash, had a biopsy. Results below  Right flank, punch - Delicate parakeratosis, mild spongiosis and slight basal layer vacuolization, see comment Electronically signed by Jonn Shingles, MD on 08/01/2017 at [phone] Comment The mixed pattern of spongiosis and vacuolar change to the basal layer suggests a drug reaction. 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 A photo related dermatitis cannot be excluded. The biopsy does not have characteristic changes of psoriasis or pityriasis rubra pilaris. No atypical lymphocytes are found. If the patient's condition persists or worsens additional biopsy may be helpful in further characterizing the process.  Patient Active Problem List   Diagnosis Date Noted   Cognitive dysfunction 08/19/2021   Vitamin B1 deficiency 08/19/2021   Folate deficiency 08/19/2021   Dysthymia 08/19/2021   Radiculitis of leg 08/19/2021   Erectile dysfunction 12/04/2019   Other hyperlipidemia 12/03/2019   Aortic atherosclerosis ([place]) 08/24/2017   Medication monitoring encounter 07/09/2015   Tinea 08/11/2014   Lipoma 10/28/2009   Disease of hair and hair follicles 74/25/9563   Benign essential  HTN 01/07/2009   Decreased libido 02/01/2007   CD (contact dermatitis) 10/10/2006    Past Surgical History:  Procedure Laterality Date   LUMBAR LAMINECTOMY     3 surgeries    Family History  Problem Relation Age of Onset   Cancer Mother    Hypertension Sister    Anemia Daughter     Social History   Tobacco Use   Smoking status: Former    Packs/day: 0.25    Years: 12.00    Total pack years: 3.00    Types: Cigarettes    Quit date: 12/24/2019    Years since quitting: 1.9   Smokeless tobacco: Former    Quit date: 08/21/2017  Substance Use Topics    Alcohol use: Yes    Alcohol/week: 2.0 standard drinks of alcohol    Types: 2 Cans of beer per week     Current Outpatient Medications:    aspirin EC 81 MG tablet, Take 1 tablet (81 mg total) by mouth daily., Disp: 30 tablet, Rfl: 11   losartan (COZAAR) 100 MG tablet, TAKE 1 TABLET(100 MG) BY MOUTH DAILY, Disp: 90 tablet, Rfl: 1   Multiple Vitamin (MULTIVITAMIN) tablet, Take 1 tablet by mouth 2 (two) times daily., Disp: , Rfl:    sildenafil (VIAGRA) 100 MG tablet, TAKE 0.5-1 TABLETS BY MOUTH DAILY AS NEEDED FOR ERECTILE DYSFUNCTION, Disp: 30 tablet, Rfl: 1  Allergies  Allergen Reactions   Levofloxacin In D5w     hallucinations    I personally reviewed active problem list, medication list, allergies, family history, social history, health maintenance with the patient/caregiver today.   ROS  ***  Objective  There were no vitals filed for this visit.  There is no height or weight on file to calculate BMI.  Physical Exam ***  No results found for this or any previous visit (from the past 2160 hour(s)).   PHQ2/9:    08/19/2021    8:25 AM 11/24/2020   10:12 AM 07/08/2020    3:35 PM 06/16/2020    2:45 PM 05/06/2020    1:43 PM  Depression screen PHQ 2/9  Decreased Interest 0 0 '3 1 1  '$ Down, Depressed, Hopeless 0 0 '1 1 2  '$ PHQ - 2 Score 0 0 '4 2 3  '$ Altered sleeping 0  1 0 3  Tired, decreased energy 0  '2 3 3  '$ Change in appetite 0  '3 3 3  '$ Feeling bad or failure about yourself  0  0 0 0  Trouble concentrating 0  0 0 1  Moving slowly or fidgety/restless 0  0 0 0  Suicidal thoughts 0  0 0 0  PHQ-9 Score 0  '10 8 13    '$ phq 9 is {gen pos [id number]}   Fall Risk:    08/19/2021    8:25 AM 11/24/2020   10:12 AM 07/08/2020    3:29 PM 06/16/2020    2:44 PM 05/06/2020    1:43 PM  Fall Risk   Falls in the past year? 0 0 0 0 0  Number falls in past yr: 0 0 0 0 0  Injury with Fall? 0 0 0 0 0  Risk for fall due to : No Fall Risks No Fall Risks     Follow up Falls prevention discussed  Falls prevention discussed         Functional Status Survey:      Assessment & Plan  *** There are no diagnoses linked to this encounter.

## 2021-11-29 ENCOUNTER — Ambulatory Visit: Payer: Medicare Other | Admitting: Family Medicine

## 2022-01-12 NOTE — Progress Notes (Unsigned)
Name: Kristopher Davis   MRN: [id number]    DOB: 12/15/1955   Date:01/13/2022       Progress Note  Subjective  Chief Complaint  Follow Up  HPI  Dysthymia/insomnia: He has been working for the past 18 years for the same company, but it was bought out by another company about in 2021.He came in in March 22  with his wife because she noticed he was not acting his normal self. He was not getting up in the mornings, he was coming home very stressed from work, bp was  spiking , feeling tired all the time, lack of motivation, he was  second guessing what he was doing at work, not sleeping well at night.  He states it started due to change in supervisors, being short staffed, he was doing the work for 4 people. He started to make mistakes at work because he was feeling overwhelmed and unable to keep up. He did not get reprimanded  at work but he felt like he could not keep up. I filled out short term disability forms and advised him to see therapist so he could get help before going back to work. He did not want to start medications. . He quit his job August 2022. He is feeling well, he is not going back to psychiatrist. He is working part time now, he is sleeping a little better, about 6 hours per night  Doing well since retirement .  Cognitive dysfunction: B1 and folic acid were low but normalized, his B12 is still low, offered B12 injections but he wants to wait until tomorrow . He is only taking a MVI at this time   Radiculitis: he states initially was a burning sensation on posterior thigh, that has resolved, he still has some burning on the bottom of his foot when he goes to goes to bed at night, he needs to take his foot from under the covers. He has a history of back surgery but no pain on his back. No weakness, bowel or bladder incontinence   HTN he is now taking losartan 100 mg but stopped  Atenolol on his own - BP was at goal on his last visit but today is very high again, we will add norvasc 2.5 mg  today , heart rate is okay. No chest pain , palpitation or SOB  Atherosclerosis of aorta: found on CT abdomen back in 2019, last LDL at goal , still explained he needs to have statin therapy   Dermatitis: he had another flare on his face last year, seen by a Dermatologist in [place] and is using topical medication, he is doing well, no problems at this time    seen by Dr. Marolyn Hammock back in 2019 with the same rash, had a biopsy. Results below  Right flank, punch - Delicate parakeratosis, mild spongiosis and slight basal layer vacuolization, see comment Electronically signed by Jonn Shingles, MD on 08/01/2017 at [phone] Comment The mixed pattern of spongiosis and vacuolar change to the basal layer suggests a drug reaction. 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 A photo related dermatitis cannot be excluded. The biopsy does not have characteristic changes of psoriasis or pityriasis rubra pilaris. No atypical lymphocytes are found. If the patient's condition persists or worsens additional biopsy may be helpful in further characterizing the process.  Patient Active Problem List   Diagnosis Date Noted   Cognitive dysfunction 08/19/2021   Vitamin B1 deficiency 08/19/2021   Folate deficiency 08/19/2021   Dysthymia 08/19/2021  Radiculitis of leg 08/19/2021   Erectile dysfunction 12/04/2019   Other hyperlipidemia 12/03/2019   Aortic atherosclerosis ([place]) 08/24/2017   Medication monitoring encounter 07/09/2015   Tinea 08/11/2014   Lipoma 10/28/2009   Disease of hair and hair follicles 91/47/8295   Benign essential HTN 01/07/2009   Decreased libido 02/01/2007   CD (contact dermatitis) 10/10/2006    Past Surgical History:  Procedure Laterality Date   LUMBAR LAMINECTOMY     3 surgeries    Family History  Problem Relation Age of Onset   Cancer Mother    Hypertension Sister    Anemia Daughter     Social History   Tobacco Use   Smoking status: Former    Packs/day: 0.25     Years: 12.00    Total pack years: 3.00    Types: Cigarettes    Quit date: 12/24/2019    Years since quitting: 2.0   Smokeless tobacco: Former    Quit date: 08/21/2017  Substance Use Topics   Alcohol use: Yes    Alcohol/week: 2.0 standard drinks of alcohol    Types: 2 Cans of beer per week     Current Outpatient Medications:    amLODipine (NORVASC) 2.5 MG tablet, Take 1 tablet (2.5 mg total) by mouth daily., Disp: 30 tablet, Rfl: 0   Cyanocobalamin (VITAMIN B-12) 1000 MCG SUBL, Place 1 tablet (1,000 mcg total) under the tongue daily at 2 PM., Disp: 100 tablet, Rfl: 1   aspirin EC 81 MG tablet, Take 1 tablet (81 mg total) by mouth daily., Disp: 30 tablet, Rfl: 11   losartan (COZAAR) 100 MG tablet, TAKE 1 TABLET(100 MG) BY MOUTH DAILY, Disp: 90 tablet, Rfl: 1   Multiple Vitamin (MULTIVITAMIN) tablet, Take 1 tablet by mouth 2 (two) times daily., Disp: , Rfl:    sildenafil (VIAGRA) 100 MG tablet, TAKE 0.5-1 TABLETS BY MOUTH DAILY AS NEEDED FOR ERECTILE DYSFUNCTION, Disp: 30 tablet, Rfl: 1  Allergies  Allergen Reactions   Levofloxacin In D5w     hallucinations    I personally reviewed active problem list, medication list, allergies, family history, social history, health maintenance with the patient/caregiver today.   ROS  Constitutional: Negative for fever or weight change.  Respiratory: Negative for cough and shortness of breath.   Cardiovascular: Negative for chest pain or palpitations.  Gastrointestinal: Negative for abdominal pain, no bowel changes.  Musculoskeletal: Negative for gait problem or joint swelling.  Skin: Negative for rash.  Neurological: Negative for dizziness or headache.  No other specific complaints in a complete review of systems (except as listed in HPI above).   Objective  Vitals:   01/13/22 1142 01/13/22 1149 01/13/22 1214  BP: (!) 180/110 (!) 180/106 (!) 172/100  Pulse: 92    Resp: 16    Temp: 99 F (37.2 C)    TempSrc: Oral    SpO2: 100%     Weight: 218 lb 8 oz (99.1 kg)    Height: '6\' 4"'$  (1.93 m)      Body mass index is 26.6 kg/m.  Physical Exam  Constitutional: Patient appears well-developed and well-nourished.  No distress.  HEENT: head atraumatic, normocephalic, pupils equal and reactive to light, neck supple Cardiovascular: Normal rate, regular rhythm and normal heart sounds.  No murmur heard. No BLE edema. Pulmonary/Chest: Effort normal and breath sounds normal. No respiratory distress. Abdominal: Soft.  There is no tenderness. Psychiatric: Patient has a normal mood and affect. behavior is normal. Judgment and thought content normal.   PHQ2/9:  01/13/2022   11:44 AM 08/19/2021    8:25 AM 11/24/2020   10:12 AM 07/08/2020    3:35 PM 06/16/2020    2:45 PM  Depression screen PHQ 2/9  Decreased Interest 0 0 0 3 1  Down, Depressed, Hopeless 0 0 0 1 1  PHQ - 2 Score 0 0 0 4 2  Altered sleeping 0 0  1 0  Tired, decreased energy 0 0  2 3  Change in appetite 0 0  3 3  Feeling bad or failure about yourself  0 0  0 0  Trouble concentrating 0 0  0 0  Moving slowly or fidgety/restless 0 0  0 0  Suicidal thoughts 0 0  0 0  PHQ-9 Score 0 0  10 8    phq 9 is negative   Fall Risk:    01/13/2022   11:43 AM 08/19/2021    8:25 AM 11/24/2020   10:12 AM 07/08/2020    3:29 PM 06/16/2020    2:44 PM  Fall Risk   Falls in the past year? 0 0 0 0 0  Number falls in past yr:  0 0 0 0  Injury with Fall?  0 0 0 0  Risk for fall due to : No Fall Risks No Fall Risks No Fall Risks    Follow up Education provided;Falls evaluation completed;Falls prevention discussed Falls prevention discussed Falls prevention discussed      Assessment & Plan   1. Benign essential HTN  - amLODipine (NORVASC) 2.5 MG tablet; Take 1 tablet (2.5 mg total) by mouth daily.  Dispense: 30 tablet; Refill: 0  2. B12 deficiency  - Cyanocobalamin (VITAMIN B-12) 1000 MCG SUBL; Place 1 tablet (1,000 mcg total) under the tongue daily at 2 PM.  Dispense:  100 tablet; Refill: 1  3. Vitamin B1 deficiency  Normalized   4. Aortic atherosclerosis ([facility])  Refuses statin therapy   5. Folate deficiency  Last level back to normal continue MVI  6. Dysthymia  Seems to have resolved   7. Cognitive dysfunction  Improving   8. Decreased libido  - Ambulatory referral to Urology  9. Erectile dysfunction, unspecified erectile dysfunction type  - Ambulatory referral to Urology  10. Colon cancer screening  - Cologuard

## 2022-01-13 ENCOUNTER — Other Ambulatory Visit: Payer: Self-pay | Admitting: Family Medicine

## 2022-01-13 ENCOUNTER — Ambulatory Visit (INDEPENDENT_AMBULATORY_CARE_PROVIDER_SITE_OTHER): Payer: Medicare Other | Admitting: Family Medicine

## 2022-01-13 ENCOUNTER — Encounter: Payer: Self-pay | Admitting: Family Medicine

## 2022-01-13 VITALS — BP 172/100 | HR 92 | Temp 99.0°F | Resp 16 | Ht 76.0 in | Wt 218.5 lb

## 2022-01-13 DIAGNOSIS — I10 Essential (primary) hypertension: Secondary | ICD-10-CM

## 2022-01-13 DIAGNOSIS — E538 Deficiency of other specified B group vitamins: Secondary | ICD-10-CM

## 2022-01-13 DIAGNOSIS — E519 Thiamine deficiency, unspecified: Secondary | ICD-10-CM | POA: Diagnosis not present

## 2022-01-13 DIAGNOSIS — R6882 Decreased libido: Secondary | ICD-10-CM

## 2022-01-13 DIAGNOSIS — F09 Unspecified mental disorder due to known physiological condition: Secondary | ICD-10-CM

## 2022-01-13 DIAGNOSIS — I700 Atherosclerosis of aorta: Secondary | ICD-10-CM | POA: Diagnosis not present

## 2022-01-13 DIAGNOSIS — N529 Male erectile dysfunction, unspecified: Secondary | ICD-10-CM

## 2022-01-13 DIAGNOSIS — Z1211 Encounter for screening for malignant neoplasm of colon: Secondary | ICD-10-CM

## 2022-01-13 DIAGNOSIS — F341 Dysthymic disorder: Secondary | ICD-10-CM

## 2022-01-13 MED ORDER — AMLODIPINE BESYLATE 2.5 MG PO TABS: 2.5000 mg | ORAL_TABLET | Freq: Every day | ORAL | 0 refills | Status: DC

## 2022-01-13 MED ORDER — VITAMIN B-12 1000 MCG SL SUBL
1.0000 | SUBLINGUAL_TABLET | Freq: Every day | SUBLINGUAL | 1 refills | Status: DC
Start: 2022-01-13 — End: 2022-08-23

## 2022-01-13 NOTE — Patient Instructions (Signed)
Take on amlodipine 2.5 mg as soon as you get it from the pharmacy today, take it again every morning with losartan   Return tomorrow afternoon for B12 shot and bp recheck

## 2022-01-14 ENCOUNTER — Ambulatory Visit (INDEPENDENT_AMBULATORY_CARE_PROVIDER_SITE_OTHER): Payer: Medicare Other

## 2022-01-14 DIAGNOSIS — E538 Deficiency of other specified B group vitamins: Secondary | ICD-10-CM | POA: Diagnosis not present

## 2022-01-14 MED ORDER — CYANOCOBALAMIN 1000 MCG/ML IJ SOLN
1000.0000 ug | Freq: Once | INTRAMUSCULAR | Status: AC
  Administered 2022-01-14: 1000 ug via INTRAMUSCULAR

## 2022-01-18 ENCOUNTER — Ambulatory Visit: Payer: Medicare Other

## 2022-01-18 VITALS — BP 172/110

## 2022-01-18 DIAGNOSIS — I10 Essential (primary) hypertension: Secondary | ICD-10-CM

## 2022-01-19 ENCOUNTER — Ambulatory Visit: Payer: Medicare Other

## 2022-01-19 VITALS — BP 164/98

## 2022-01-19 DIAGNOSIS — Z0130 Encounter for examination of blood pressure without abnormal findings: Secondary | ICD-10-CM

## 2022-01-26 ENCOUNTER — Other Ambulatory Visit: Payer: Self-pay

## 2022-01-26 ENCOUNTER — Telehealth: Payer: Self-pay | Admitting: Family Medicine

## 2022-01-26 ENCOUNTER — Telehealth: Payer: Self-pay

## 2022-01-26 DIAGNOSIS — Z1211 Encounter for screening for malignant neoplasm of colon: Secondary | ICD-10-CM

## 2022-01-26 MED ORDER — NA SULFATE-K SULFATE-MG SULF 17.5-3.13-1.6 GM/177ML PO SOLN: 1.0000 | Freq: Once | ORAL | 0 refills | Status: AC

## 2022-01-26 NOTE — Telephone Encounter (Signed)
Referral placed. Patient preferred colonoscopy over cologuard.

## 2022-01-26 NOTE — Telephone Encounter (Signed)
Copied from [place] [id number]. Topic: Referral - Request for Referral >> Jan 26, 2022  9:34 AM Everette C wrote: Has patient seen PCP for this complaint? Yes.   *If NO, is insurance requiring patient see PCP for this issue before PCP can refer them? Referral for which specialty: Gastroenterology  Preferred provider/office: Patient has no preference  Reason for referral: Patient would like a colonoscopy   The patient would like to know what to do with their cologuard box as well

## 2022-01-26 NOTE — Telephone Encounter (Signed)
Gastroenterology Pre-Procedure Review  Request Date: 02/08/22 Requesting Physician: Dr. Vicente Males  PATIENT REVIEW QUESTIONS: The patient responded to the following health history questions as indicated:    1. Are you having any GI issues? no 2. Do you have a personal history of Polyps? no 3. Do you have a family history of Colon Cancer or Polyps? no 4. Diabetes Mellitus? no 5. Joint replacements in the past 12 months?no 6. Major health problems in the past 3 months?no 7. Any artificial heart valves, MVP, or defibrillator?no    MEDICATIONS & ALLERGIES:    Patient reports the following regarding taking any anticoagulation/antiplatelet therapy:   Plavix, Coumadin, Eliquis, Xarelto, Lovenox, Pradaxa, Brilinta, or Effient? no Aspirin? no  Patient confirms/reports the following medications:  Current Outpatient Medications  Medication Sig Dispense Refill   amLODipine (NORVASC) 2.5 MG tablet Take 1 tablet (2.5 mg total) by mouth daily. 30 tablet 0   aspirin EC 81 MG tablet Take 1 tablet (81 mg total) by mouth daily. 30 tablet 11   Cyanocobalamin (VITAMIN B-12) 1000 MCG SUBL Place 1 tablet (1,000 mcg total) under the tongue daily at 2 PM. 100 tablet 1   losartan (COZAAR) 100 MG tablet TAKE 1 TABLET(100 MG) BY MOUTH DAILY 90 tablet 1   Multiple Vitamin (MULTIVITAMIN) tablet Take 1 tablet by mouth 2 (two) times daily.     sildenafil (VIAGRA) 100 MG tablet TAKE 0.5-1 TABLETS BY MOUTH DAILY AS NEEDED FOR ERECTILE DYSFUNCTION 30 tablet 1   No current facility-administered medications for this visit.    Patient confirms/reports the following allergies:  Allergies  Allergen Reactions   Levofloxacin In D5w     hallucinations    No orders of the defined types were placed in this encounter.   AUTHORIZATION INFORMATION Primary Insurance: 1D#: Group #:  Secondary Insurance: 1D#: Group #:  SCHEDULE INFORMATION: Date: 02/08/22 Time: Location: [facility]

## 2022-02-02 ENCOUNTER — Ambulatory Visit: Payer: Medicare Other | Admitting: Urology

## 2022-02-02 ENCOUNTER — Encounter: Payer: Self-pay | Admitting: Urology

## 2022-02-02 VITALS — BP 165/112 | HR 95 | Ht 76.0 in | Wt 204.0 lb

## 2022-02-02 DIAGNOSIS — Z125 Encounter for screening for malignant neoplasm of prostate: Secondary | ICD-10-CM | POA: Diagnosis not present

## 2022-02-02 DIAGNOSIS — N5201 Erectile dysfunction due to arterial insufficiency: Secondary | ICD-10-CM

## 2022-02-02 DIAGNOSIS — R6882 Decreased libido: Secondary | ICD-10-CM | POA: Diagnosis not present

## 2022-02-02 MED ORDER — TADALAFIL 20 MG PO TABS: ORAL_TABLET | ORAL | 0 refills | Status: DC

## 2022-02-02 NOTE — Progress Notes (Signed)
02/02/2022 9:23 AM   Kristopher Davis Jan 27, 1956 [id number]  Referring provider: Steele Sizer, MD [address] [place],  [place] [address]  Chief Complaint  Patient presents with   Other    HPI: Isack Lavalley is a 66 y.o. male referred for evaluation of erectile dysfunction and low libido.  Record review indicates erectile dysfunction since 2021 and decreased libido since 2008. Organic risk factors include hypertension, atherosclerosis, hyperlipidemia and antihypertensive medications Minimal erectile activity not firm enough for penetration. SHIM 5/25 indicating severe ED No pain or curvature with erections Prior treatments include sildenafil 100 mg-achieves a partial erection not firm enough for penetration States he has no sex drive Testosterone level has not been checked on record review Last PSA was in 2017 and it was 1.0   PMH: Past Medical History:  Diagnosis Date   Decreased libido 02/01/2007   Hypertension     Surgical History: Past Surgical History:  Procedure Laterality Date   LUMBAR LAMINECTOMY     3 surgeries    Home Medications:  Allergies as of 02/02/2022       Reactions   Levofloxacin In D5w    hallucinations        Medication List        Accurate as of February 02, 2022  9:23 AM. If you have any questions, ask your nurse or doctor.          amLODipine 2.5 MG tablet Commonly known as: NORVASC Take 1 tablet (2.5 mg total) by mouth daily.   aspirin EC 81 MG tablet Take 1 tablet (81 mg total) by mouth daily.   losartan 100 MG tablet Commonly known as: COZAAR TAKE 1 TABLET(100 MG) BY MOUTH DAILY   multivitamin tablet Take 1 tablet by mouth 2 (two) times daily.   sildenafil 100 MG tablet Commonly known as: VIAGRA TAKE 0.5-1 TABLETS BY MOUTH DAILY AS NEEDED FOR ERECTILE DYSFUNCTION   Vitamin B-12 1000 MCG Subl Place 1 tablet (1,000 mcg total) under the tongue daily at 2 PM.        Allergies:  Allergies  Allergen  Reactions   Levofloxacin In D5w     hallucinations    Family History: Family History  Problem Relation Age of Onset   Cancer Mother    Hypertension Sister    Anemia Daughter     Social History:  reports that he quit smoking about 2 years ago. His smoking use included cigarettes. He has a 3.00 pack-year smoking history. He quit smokeless tobacco use about 4 years ago. He reports current alcohol use of about 2.0 standard drinks of alcohol per week. He reports that he does not use drugs.   Physical Exam: BP (!) 165/112   Pulse 95   Ht '6\' 4"'$  (1.93 m)   Wt 204 lb (92.5 kg)   BMI 24.83 kg/m   Constitutional:  Alert, No acute distress. HEENT:  AT Respiratory: Normal respiratory effort, no increased work of breathing. GU: Phallus slightly.  Testes descended bilaterally without masses or tenderness.  Testes normal size bilaterally Psychiatric: Normal mood and affect.   Assessment & Plan:    1.  Erectile dysfunction Severe the most likely secondary to vascular insufficiency Second line options were discussed including intracavernosal injections and vacuum erection devices.  He does not desire ICI and was given literature on vacuum erection devices A second PDE 5 inhibitor most likely will not be effective however he did desire a trial off tadalafil and Rx 20 mg and  pharmacy  2.  Low libido Testosterone level drawn We discussed if testosterone level was low replacement would not resolve his ED however may increase the efficacy of PDE 5 inhibitors  3.  Prostate cancer screening Last PSA was in 2017 He did desire to have his PSA checked today   Abbie Sons, MD  [facility] [address] [place], [place] [address] [phone]

## 2022-02-03 LAB — PSA: Prostate Specific Ag, Serum: 1.9 ng/mL (ref 0.0–4.0)

## 2022-02-03 LAB — TESTOSTERONE: Testosterone: 807 ng/dL (ref 264–916)

## 2022-02-04 ENCOUNTER — Telehealth: Payer: Self-pay | Admitting: *Deleted

## 2022-02-04 NOTE — Telephone Encounter (Signed)
Notified patient as instructed.

## 2022-02-04 NOTE — Telephone Encounter (Signed)
-----   Message from Abbie Sons, MD sent at 02/04/2022  7:33 AM EST ----- Testosterone level was 807.  This would not be a cause of his low sex drive or ED

## 2022-02-07 ENCOUNTER — Encounter: Payer: Self-pay | Admitting: Gastroenterology

## 2022-02-08 ENCOUNTER — Encounter
Admission: RE | Disposition: A | Discharge status: Home / Self Care | Payer: Self-pay | Source: Home / Self Care | Attending: Gastroenterology

## 2022-02-08 ENCOUNTER — Ambulatory Visit
Admission: RE | Admit: 2022-02-08 | Discharge: 2022-02-08 | Disposition: A | Discharge status: Home / Self Care | Payer: Medicare Other | Attending: Gastroenterology | Admitting: Gastroenterology

## 2022-02-08 ENCOUNTER — Ambulatory Visit: Payer: Medicare Other | Admitting: Anesthesiology

## 2022-02-08 DIAGNOSIS — Z1211 Encounter for screening for malignant neoplasm of colon: Secondary | ICD-10-CM

## 2022-02-08 DIAGNOSIS — Z87891 Personal history of nicotine dependence: Secondary | ICD-10-CM | POA: Insufficient documentation

## 2022-02-08 DIAGNOSIS — I10 Essential (primary) hypertension: Secondary | ICD-10-CM | POA: Insufficient documentation

## 2022-02-08 DIAGNOSIS — D123 Benign neoplasm of transverse colon: Secondary | ICD-10-CM | POA: Insufficient documentation

## 2022-02-08 DIAGNOSIS — Z79899 Other long term (current) drug therapy: Secondary | ICD-10-CM | POA: Diagnosis not present

## 2022-02-08 DIAGNOSIS — D126 Benign neoplasm of colon, unspecified: Secondary | ICD-10-CM

## 2022-02-08 HISTORY — PX: COLONOSCOPY WITH PROPOFOL: SHX5780

## 2022-02-08 SURGERY — COLONOSCOPY WITH PROPOFOL
Anesthesia: General

## 2022-02-08 MED ORDER — PROPOFOL 10 MG/ML IV BOLUS
INTRAVENOUS | Status: DC | PRN
  Administered 2022-02-08: 70 mg via INTRAVENOUS

## 2022-02-08 MED ORDER — DEXMEDETOMIDINE HCL IN NACL 200 MCG/50ML IV SOLN
INTRAVENOUS | Status: DC | PRN
  Administered 2022-02-08: 8 ug via INTRAVENOUS

## 2022-02-08 MED ORDER — STERILE WATER FOR IRRIGATION IR SOLN
Status: DC | PRN
  Administered 2022-02-08: 60 mL

## 2022-02-08 MED ORDER — GLYCOPYRROLATE 0.2 MG/ML IJ SOLN
INTRAMUSCULAR | Status: DC | PRN
  Administered 2022-02-08: .2 mg via INTRAVENOUS

## 2022-02-08 MED ORDER — LIDOCAINE HCL (CARDIAC) PF 100 MG/5ML IV SOSY
PREFILLED_SYRINGE | INTRAVENOUS | Status: DC | PRN
  Administered 2022-02-08: 100 mg via INTRAVENOUS

## 2022-02-08 MED ORDER — SODIUM CHLORIDE 0.9 % IV SOLN: INTRAVENOUS | Status: DC

## 2022-02-08 MED ORDER — PROPOFOL 500 MG/50ML IV EMUL
INTRAVENOUS | Status: DC | PRN
  Administered 2022-02-08: 165 ug/kg/min via INTRAVENOUS

## 2022-02-08 NOTE — Anesthesia Postprocedure Evaluation (Signed)
Anesthesia Post Note  Patient: Warden/ranger  Procedure(s) Performed: COLONOSCOPY WITH PROPOFOL  Patient location during evaluation: Endoscopy Anesthesia Type: General Level of consciousness: awake and alert Pain management: pain level controlled Vital Signs Assessment: post-procedure vital signs reviewed and stable Respiratory status: spontaneous breathing, nonlabored ventilation, respiratory function stable and patient connected to nasal cannula oxygen Cardiovascular status: blood pressure returned to baseline and stable Postop Assessment: no apparent nausea or vomiting Anesthetic complications: no  No notable events documented.   Last Vitals:  Vitals:   02/08/22 1009 02/08/22 1019  BP: 104/73 (!) 116/91  Pulse: 74 81  Resp: 17 16  Temp:    SpO2: 100% 100%    Last Pain:  Vitals:   02/08/22 1019  TempSrc:   PainSc: 0-No pain                 Ilene Qua

## 2022-02-08 NOTE — Anesthesia Procedure Notes (Signed)
Procedure Name: General with mask airway Date/Time: 02/08/2022 9:50 AM  Performed by: Kelton Pillar, CRNAPre-anesthesia Checklist: Patient identified, Emergency Drugs available, Suction available and Patient being monitored Patient Re-evaluated:Patient Re-evaluated prior to induction Oxygen Delivery Method: Simple face mask Induction Type: IV induction Placement Confirmation: positive ETCO2, breath sounds checked- equal and bilateral and CO2 detector Tube secured with: Tape Dental Injury: Teeth and Oropharynx as per pre-operative assessment

## 2022-02-08 NOTE — Op Note (Signed)
[facility] Gastroenterology Patient Name: Kristopher Davis Procedure Date: 02/08/2022 9:41 AM MRN: [id number] Account #: [id number] Date of Birth: Jul 09, 1955 Admit Type: Outpatient Age: 66 Room: [facility] ENDO ROOM 2 Gender: Male Note Status: Finalized Instrument Name: Jasper Riling [id number] Procedure:             Colonoscopy Indications:           Screening for colorectal malignant neoplasm Providers:             Jonathon Bellows MD, MD Referring MD:          Bethena Roys. Sowles, MD (Referring MD) Medicines:             Monitored Anesthesia Care Complications:         No immediate complications. Procedure:             Pre-Anesthesia Assessment:                        - Prior to the procedure, a History and Physical was                         performed, and patient medications, allergies and                         sensitivities were reviewed. The patient's tolerance                         of previous anesthesia was reviewed.                        - The risks and benefits of the procedure and the                         sedation options and risks were discussed with the                         patient. All questions were answered and informed                         consent was obtained.                        - ASA Grade Assessment: II - A patient with mild                         systemic disease.                        - After reviewing the risks and benefits, the patient                         was deemed in satisfactory condition to undergo the                         procedure.                        After obtaining informed consent, the colonoscope was                         passed under direct vision.  Throughout the procedure,                         the patient's blood pressure, pulse, and oxygen                         saturations were monitored continuously. The                         Colonoscope was introduced through the anus and                         advanced  to the the cecum, identified by the                         appendiceal orifice. The colonoscopy was performed                         with ease. The patient tolerated the procedure well.                         The quality of the bowel preparation was excellent.                         The ileocecal valve, appendiceal orifice, and rectum                         were photographed. Findings:      The perianal and digital rectal examinations were normal.      A 3 mm polyp was found in the transverse colon. The polyp was sessile.       The polyp was removed with a cold biopsy forceps. Resection and       retrieval were complete.      The entire examined colon appeared normal on direct and retroflexion       views. Impression:            - One 3 mm polyp in the transverse colon, removed with                         a cold biopsy forceps. Resected and retrieved.                        - The entire examined colon is normal on direct and                         retroflexion views. Recommendation:        - Discharge patient to home (with escort).                        - Resume previous diet.                        - Continue present medications.                        - Await pathology results.                        - Repeat colonoscopy for surveillance based on  pathology results. Procedure Code(s):     --- Professional ---                        [phone], Colonoscopy, flexible; with biopsy, single or                         multiple Diagnosis Code(s):     --- Professional ---                        Z12.11, Encounter for screening for malignant neoplasm                         of colon                        D12.3, Benign neoplasm of transverse colon (hepatic                         flexure or splenic flexure) CPT copyright 2022 American Medical Association. All rights reserved. The codes documented in this report are preliminary and upon coder review may  be revised  to meet current compliance requirements. Jonathon Bellows, MD Jonathon Bellows MD, MD 02/08/2022 10:08:12 AM This report has been signed electronically. Number of Addenda: 0 Note Initiated On: 02/08/2022 9:41 AM Scope Withdrawal Time: 0 hours 9 minutes 41 seconds  Total Procedure Duration: 0 hours 12 minutes 38 seconds  Estimated Blood Loss:  Estimated blood loss: none.      [facility]

## 2022-02-08 NOTE — Transfer of Care (Signed)
Immediate Anesthesia Transfer of Care Note  Patient: Kristopher Davis  Procedure(s) Performed: COLONOSCOPY WITH PROPOFOL  Patient Location: Endoscopy Unit  Anesthesia Type:General  Level of Consciousness: drowsy and patient cooperative  Airway & Oxygen Therapy: Patient Spontanous Breathing and Patient connected to face mask oxygen  Post-op Assessment: Report given to RN and Post -op Vital signs reviewed and stable  Post vital signs: Reviewed and stable  Last Vitals:  Vitals Value Taken Time  BP 104/73 02/08/22 1009  Temp    Pulse 74 02/08/22 1009  Resp 17 02/08/22 1009  SpO2 100 % 02/08/22 1009    Last Pain:  Vitals:   02/08/22 1009  TempSrc:   PainSc: Asleep         Complications: No notable events documented.

## 2022-02-08 NOTE — Anesthesia Preprocedure Evaluation (Addendum)
Anesthesia Evaluation  Patient identified by MRN, date of birth, ID band Patient awake    Reviewed: Allergy & Precautions, NPO status , Patient's Chart, lab work & pertinent test results  History of Anesthesia Complications Negative for: history of anesthetic complications  Airway Mallampati: II  TM Distance: >3 FB Neck ROM: full    Dental  (+) Teeth Intact   Pulmonary former smoker   Pulmonary exam normal        Cardiovascular hypertension, On Medications Normal cardiovascular exam     Neuro/Psych negative neurological ROS  negative psych ROS   GI/Hepatic negative GI ROS, Neg liver ROS,,,  Endo/Other  negative endocrine ROS    Renal/GU negative Renal ROS  negative genitourinary   Musculoskeletal   Abdominal   Peds  Hematology negative hematology ROS (+)   Anesthesia Other Findings Past Medical History: 02/01/2007: Decreased libido No date: Hypertension  Past Surgical History: No date: LUMBAR LAMINECTOMY     Comment:  3 surgeries  BMI    Body Mass Index: 25.31 kg/m      Reproductive/Obstetrics negative OB ROS                             Anesthesia Physical Anesthesia Plan  ASA: 2  Anesthesia Plan: General   Post-op Pain Management: Minimal or no pain anticipated   Induction: Intravenous  PONV Risk Score and Plan: Propofol infusion and TIVA  Airway Management Planned: Natural Airway and Nasal Cannula  Additional Equipment:   Intra-op Plan:   Post-operative Plan:   Informed Consent: I have reviewed the patients History and Physical, chart, labs and discussed the procedure including the risks, benefits and alternatives for the proposed anesthesia with the patient or authorized representative who has indicated his/her understanding and acceptance.     Dental Advisory Given  Plan Discussed with: Anesthesiologist, CRNA and Surgeon  Anesthesia Plan Comments:  (Patient consented for risks of anesthesia including but not limited to:  - adverse reactions to medications - risk of airway placement if required - damage to eyes, teeth, lips or other oral mucosa - nerve damage due to positioning  - sore throat or hoarseness - Damage to heart, brain, nerves, lungs, other parts of body or loss of life  Patient voiced understanding.)       Anesthesia Quick Evaluation

## 2022-02-08 NOTE — H&P (Signed)
Kristopher Bellows, MD [address] [place], [place], [place], [address] [address], [place], [place], [place], [address] Phone: [phone]  Fax: [phone]  Primary Care Physician:  Steele Sizer, MD   Pre-Procedure History & Physical: HPI:  Kristopher Davis is a 66 y.o. male is here for an colonoscopy.   Past Medical History:  Diagnosis Date   Decreased libido 02/01/2007   Hypertension     Past Surgical History:  Procedure Laterality Date   LUMBAR LAMINECTOMY     3 surgeries    Prior to Admission medications   Medication Sig Start Date End Date Taking? Authorizing Provider  amLODipine (NORVASC) 2.5 MG tablet Take 1 tablet (2.5 mg total) by mouth daily. 01/13/22  Yes Steele Sizer, MD  aspirin EC 81 MG tablet Take 1 tablet (81 mg total) by mouth daily. 07/09/15  Yes Lada, Satira Anis, MD  losartan (COZAAR) 100 MG tablet TAKE 1 TABLET(100 MG) BY MOUTH DAILY 08/19/21  Yes Sowles, Drue Stager, MD  Cyanocobalamin (VITAMIN B-12) 1000 MCG SUBL Place 1 tablet (1,000 mcg total) under the tongue daily at 2 PM. 01/13/22   Steele Sizer, MD  Multiple Vitamin (MULTIVITAMIN) tablet Take 1 tablet by mouth 2 (two) times daily.    [provider]  tadalafil (CIALIS) 20 MG tablet Take 1 tab 1 hour prior to intercourse 02/02/22   Abbie Sons, MD    Allergies as of 01/26/2022 - Review Complete 01/13/2022  Allergen Reaction Noted   Levofloxacin in d5w  08/24/2017    Family History  Problem Relation Age of Onset   Cancer Mother    Hypertension Sister    Anemia Daughter     Social History   Socioeconomic History   Marital status: Married    Spouse name: Not on file   Number of children: 2   Years of education: Not on file   Highest education level: Not on file  Occupational History   Occupation: verifies orders / inspection of products     Comment: makes Data processing manager   Tobacco Use   Smoking status: Former    Packs/day: 0.25    Years: 12.00    Total pack  years: 3.00    Types: Cigarettes    Quit date: 12/24/2019    Years since quitting: 2.1   Smokeless tobacco: Former    Quit date: 08/21/2017  Vaping Use   Vaping Use: Never used  Substance and Sexual Activity   Alcohol use: Yes    Alcohol/week: 2.0 standard drinks of alcohol    Types: 2 Cans of beer per week   Drug use: No   Sexual activity: Yes    Partners: Female    Comment: married  Other Topics Concern   Not on file  Social History Narrative   He was a Programmer, 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[employer], 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   He works    Scientist, physiological Strain: [place]  (06/16/2020)   Overall Financial Resource Strain (CARDIA)    Difficulty of Paying Living Expenses: Not hard at all  Food Insecurity: No Food Insecurity (06/16/2020)   Hunger Vital Sign    Worried About Running Out of Food in the Last Year: Never true    [place] in the Last Year: Never true  Transportation Needs: No Transportation Needs (06/16/2020)   PRAPARE - Hydrologist (Medical): No    Lack of Transportation (Non-Medical): No  Physical Activity:  Sufficiently Active (06/16/2020)   Exercise Vital Sign    Days of Exercise per Week: 6 days    Minutes of Exercise per Session: 60 min  Stress: Stress Concern Present (06/16/2020)   [place]    Feeling of Stress : Very much  Social Connections: Socially Integrated (06/16/2020)   Social Connection and Isolation Panel [NHANES]    Frequency of Communication with Friends and Family: More than three times a week    Frequency of Social Gatherings with Friends and Family: More than three times a week    Attends Religious Services: More than 4 times per year    Active Member of [employer] or Organizations: Yes    Attends Music therapist: More than 4 times per year    Marital Status: Married  Human resources officer Violence: Not At Risk (06/16/2020)   Humiliation,  Afraid, Rape, and Kick questionnaire    Fear of Current or Ex-Partner: No    Emotionally Abused: No    Physically Abused: No    Sexually Abused: No    Review of Systems: See HPI, otherwise negative ROS  Physical Exam: BP (!) 178/106   Pulse 71   Temp (!) 97.1 F (36.2 C) (Tympanic)   Resp 17   Ht '6\' 6"'$  (1.981 m)   Wt 99.3 kg   SpO2 100%   BMI 25.31 kg/m  General:   Alert,  pleasant and cooperative in NAD Head:  Normocephalic and atraumatic. Neck:  Supple; no masses or thyromegaly. Lungs:  Clear throughout to auscultation, normal respiratory effort.    Heart:  +S1, +S2, Regular rate and rhythm, No edema. Abdomen:  Soft, nontender and nondistended. Normal bowel sounds, without guarding, and without rebound.   Neurologic:  Alert and  oriented x4;  grossly normal neurologically.  Impression/Plan: Kristopher Davis is here for an colonoscopy to be performed for Screening colonoscopy average risk   Risks, benefits, limitations, and alternatives regarding  colonoscopy have been reviewed with the patient.  Questions have been answered.  All parties agreeable.   Kristopher Bellows, MD  02/08/2022, 9:38 AM

## 2022-02-09 ENCOUNTER — Encounter: Payer: Self-pay | Admitting: Gastroenterology

## 2022-02-09 ENCOUNTER — Other Ambulatory Visit: Payer: Self-pay | Admitting: Family Medicine

## 2022-02-09 DIAGNOSIS — I10 Essential (primary) hypertension: Secondary | ICD-10-CM

## 2022-02-09 LAB — SURGICAL PATHOLOGY

## 2022-02-09 NOTE — Telephone Encounter (Signed)
Requested Prescriptions  Pending Prescriptions Disp Refills   amLODipine (NORVASC) 2.5 MG tablet [Pharmacy Med Name: AMLODIPINE BESYLATE 2.'5MG'$  TABLETS] 90 tablet 0    Sig: TAKE 1 TABLET(2.5 MG) BY MOUTH DAILY     Cardiovascular: Calcium Channel Blockers 2 Failed - 02/09/2022  5:16 PM      Failed - Last BP in normal range    BP Readings from Last 1 Encounters:  02/08/22 (!) 136/96         Passed - Last Heart Rate in normal range    Pulse Readings from Last 1 Encounters:  02/08/22 74         Passed - Valid encounter within last 6 months    Recent Outpatient Visits           3 weeks ago Benign essential HTN   [facility] Steele Sizer, MD   5 months ago Aortic atherosclerosis [facility])   [facility] Steele Sizer, MD   1 year ago Facial rash   [facility] Steele Sizer, MD   1 year ago [facility] Steele Sizer, MD   1 year ago [facility] Steele Sizer, MD       Future Appointments             In 1 week Steele Sizer, MD [facility], [place]   In 6 months Steele Sizer, MD [facility], [facility]

## 2022-02-18 NOTE — Progress Notes (Unsigned)
Name: Kristopher Davis   MRN: [id number]    DOB: [birth year]   Date:02/22/2022       Progress Note  Subjective  Chief Complaint  Annual Exam  HPI  Patient presents for annual CPE.  IPSS Questionnaire (AUA-7): Over the past month.   1)  How often have you had a sensation of not emptying your bladder completely after you finish urinating?  0 - Not at all  2)  How often have you had to urinate again less than two hours after you finished urinating? 0 - Not at all  3)  How often have you found you stopped and started again several times when you urinated?  0 - Not at all  4) How difficult have you found it to postpone urination?  0 - Not at all  5) How often have you had a weak urinary stream?  0 - Not at all  6) How often have you had to push or strain to begin urination?  0 - Not at all  7) How many times did you most typically get up to urinate from the time you went to bed until the time you got up in the morning?  1 - 1 time  Total score:  0-7 mildly symptomatic   8-19 moderately symptomatic   20-35 severely symptomatic     Diet: eats mostly at home Exercise: walking twice a week , discussed 150 minutes a week Last Dental Exam: up to date  Last Eye Exam: up to date   Depression: phq 9 is negative    02/22/2022    7:56 AM 01/13/2022   11:44 AM 08/19/2021    8:25 AM 11/24/2020   10:12 AM 07/08/2020    3:35 PM  Depression screen PHQ 2/9  Decreased Interest 0 0 0 0 3  Down, Depressed, Hopeless 0 0 0 0 1  PHQ - 2 Score 0 0 0 0 4  Altered sleeping 0 0 0  1  Tired, decreased energy 0 0 0  2  Change in appetite 0 0 0  3  Feeling bad or failure about yourself  0 0 0  0  Trouble concentrating 0 0 0  0  Moving slowly or fidgety/restless 0 0 0  0  Suicidal thoughts 0 0 0  0  PHQ-9 Score 0 0 0  10    Hypertension:  BP Readings from Last 3 Encounters:  02/22/22 (!) 150/86  02/08/22 (!) 136/96  02/02/22 (!) 165/112    Obesity: Wt Readings from Last 3 Encounters:  02/22/22 222  lb (100.7 kg)  02/08/22 219 lb (99.3 kg)  02/02/22 204 lb (92.5 kg)   BMI Readings from Last 3 Encounters:  02/22/22 27.02 kg/m  02/08/22 25.31 kg/m  02/02/22 24.83 kg/m     Lipids:  Lab Results  Component Value Date   CHOL 164 08/19/2021   CHOL 161 05/06/2020   CHOL 212 (H) 04/19/2019   Lab Results  Component Value Date   HDL 93 08/19/2021   HDL 98 05/06/2020   HDL 88 04/19/2019   Lab Results  Component Value Date   LDLCALC 59 08/19/2021   LDLCALC 48 05/06/2020   LDLCALC 110 (H) 04/19/2019   Lab Results  Component Value Date   TRIG 50 08/19/2021   TRIG 72 05/06/2020   TRIG 47 04/19/2019   Lab Results  Component Value Date   CHOLHDL 1.8 08/19/2021   CHOLHDL 1.6 05/06/2020   CHOLHDL 2.4 04/19/2019   No results  found for: "LDLDIRECT" Glucose:  Glucose, Bld  Date Value Ref Range Status  08/19/2021 105 (H) 65 - 99 mg/dL Final    Comment:    .            Fasting reference interval . For someone without known diabetes, a glucose value between 100 and 125 mg/dL is consistent with prediabetes and should be confirmed with a follow-up test. .   05/06/2020 74 65 - 99 mg/dL Final    Comment:    .            Fasting reference interval .   12/26/2019 85 70 - 99 mg/dL Final    Comment:    Glucose reference range applies only to samples taken after fasting for at least 8 hours.    [place] Office Visit from 02/22/2022 in [facility]  AUDIT-C Score 4       Married STD testing and prevention (HIV/chl/gon/syphilis): N/A Sexual history: one partner, wife, he  sees Dealer and was given Cialis but has not picked yet  Hep C Screening: 07/13/15 Skin cancer: Discussed monitoring for atypical lesions Colorectal cancer: 02/08/22 - one polyp  Prostate cancer:  monitored by Dr. Bernardo Heater   Lung cancer:  Low Dose CT Chest recommended if Age 36-80 years, 30 pack-year currently smoking OR have quit w/in 15years. Patient  no a candidate for  screening   AAA: The USPSTF recommends one-time screening with ultrasonography in men ages 55 to 41 years who have ever smoked. Patient   no, a candidate for screening  ECG:  12/27/19  Vaccines:   RSV: refused  Tdap: refused  Shingrix: refused  Pneumonia:refused  Flu: refused  COVID-19: up to date  Advanced Care Planning: A voluntary discussion about advance care planning including the explanation and discussion of advance directives.  Discussed health care proxy and Living will, and the patient was able to identify a health care proxy as wife.  Patient does not have a living will and power of attorney of health care   Patient Active Problem List   Diagnosis Date Noted   Adenomatous polyp of colon 02/08/2022   Cognitive dysfunction 08/19/2021   Vitamin B1 deficiency 08/19/2021   Folate deficiency 08/19/2021   Dysthymia 08/19/2021   Radiculitis of leg 08/19/2021   Erectile dysfunction 12/04/2019   Other hyperlipidemia 12/03/2019   Aortic atherosclerosis ([place]) 08/24/2017   Encounter for screening colonoscopy 07/09/2015   Tinea 08/11/2014   Lipoma 10/28/2009   Disease of hair and hair follicles 61/95/0932   Benign essential HTN 01/07/2009   Decreased libido 02/01/2007   CD (contact dermatitis) 10/10/2006    Past Surgical History:  Procedure Laterality Date   COLONOSCOPY WITH PROPOFOL N/A 02/08/2022   Procedure: COLONOSCOPY WITH PROPOFOL;  Surgeon: Jonathon Bellows, MD;  Location: [facility] END[facility]COPY;  Service: Gastroenterology;  Laterality: N/A;   LUMBAR LAMINECTOMY     3 surgeries    Family History  Problem Relation Age of Onset   Cancer Mother    Hypertension Sister    Anemia Daughter     Social History   Socioeconomic History   Marital status: Married    Spouse name: Not on file   Number of children: 2   Years of education: Not on file   Highest education level: Not on file  Occupational History   Occupation: verifies orders / inspection of products     Comment:  makes Data processing manager   Tobacco Use   Smoking status: Former  Packs/day: 0.25    Years: 12.00    Total pack years: 3.00    Types: Cigarettes    Quit date: 12/24/2019    Years since quitting: 2.1   Smokeless tobacco: Former    Quit date: 08/21/2017  Vaping Use   Vaping Use: Never used  Substance and Sexual Activity   Alcohol use: Yes    Alcohol/week: 2.0 standard drinks of alcohol    Types: 2 Cans of beer per week   Drug use: No   Sexual activity: Yes    Partners: Female    Comment: married  Other Topics Concern   Not on file  Social History Narrative   He was a Programmer, 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[employer], 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   He works    Scientist, physiological Strain: [place]  (02/22/2022)   Overall Financial Resource Strain (CARDIA)    Difficulty of Paying Living Expenses: Not hard at all  Food Insecurity: No Food Insecurity (02/22/2022)   Hunger Vital Sign    Worried About [place] in the Last Year: Never true    [place] in the Last Year: Never true  Transportation Needs: No Transportation Needs (02/22/2022)   PRAPARE - Hydrologist (Medical): No    Lack of Transportation (Non-Medical): No  Physical Activity: Inactive (02/22/2022)   Exercise Vital Sign    Days of Exercise per Week: 0 days    Minutes of Exercise per Session: 0 min  Stress: No Stress Concern Present (02/22/2022)   [place]    Feeling of Stress : Not at all  Social Connections: Moderately Integrated (02/22/2022)   Social Connection and Isolation Panel [NHANES]    Frequency of Communication with Friends and Family: More than three times a week    Frequency of Social Gatherings with Friends and Family: Never    Attends Religious Services: More than 4 times per year    Active Member of [employer] or Organizations: No    Attends Archivist Meetings: Never    Marital Status:  Married  Human resources officer Violence: Not At Risk (02/22/2022)   Humiliation, Afraid, Rape, and Kick questionnaire    Fear of Current or Ex-Partner: No    Emotionally Abused: No    Physically Abused: No    Sexually Abused: No     Current Outpatient Medications:    amLODipine (NORVASC) 2.5 MG tablet, TAKE 1 TABLET(2.5 MG) BY MOUTH DAILY, Disp: 90 tablet, Rfl: 0   aspirin EC 81 MG tablet, Take 1 tablet (81 mg total) by mouth daily., Disp: 30 tablet, Rfl: 11   Cyanocobalamin (VITAMIN B-12) 1000 MCG SUBL, Place 1 tablet (1,000 mcg total) under the tongue daily at 2 PM., Disp: 100 tablet, Rfl: 1   losartan (COZAAR) 100 MG tablet, TAKE 1 TABLET(100 MG) BY MOUTH DAILY, Disp: 90 tablet, Rfl: 1   Multiple Vitamin (MULTIVITAMIN) tablet, Take 1 tablet by mouth 2 (two) times daily., Disp: , Rfl:    tadalafil (CIALIS) 20 MG tablet, Take 1 tab 1 hour prior to intercourse, Disp: 6 tablet, Rfl: 0  Allergies  Allergen Reactions   Levofloxacin In D5w     hallucinations     ROS  Constitutional: Negative for fever or weight change.  Respiratory: Negative for cough and shortness of breath.   Cardiovascular: Negative for chest pain or palpitations.  Gastrointestinal: Negative for abdominal pain, no bowel changes.  Musculoskeletal: Negative for gait problem or joint swelling.  Skin: Negative for rash.  Neurological: Negative for dizziness or headache.  No other specific complaints in a complete review of systems (except as listed in HPI above).    Objective  Vitals:   02/22/22 0756 02/22/22 0806  BP: (!) 152/88 (!) 150/86  Pulse: 91   Resp: 16   SpO2: 98%   Weight: 222 lb (100.7 kg)   Height: '6\' 4"'$  (1.93 m)     Body mass index is 27.02 kg/m.  Physical Exam  Constitutional: Patient appears well-developed and well-nourished. No distress.  HENT: Head: Normocephalic and atraumatic. Ears: B TMs ok, no erythema or effusion; Nose: Nose normal. Mouth/Throat: Oropharynx is clear and moist. No  oropharyngeal exudate.  Eyes: Conjunctivae and EOM are normal. Pupils are equal, round, and reactive to light. No scleral icterus.  Neck: Normal range of motion. Neck supple. No JVD present. No thyromegaly present.  Cardiovascular: Normal rate, regular rhythm and normal heart sounds.  No murmur heard. No BLE edema. Pulmonary/Chest: Effort normal and breath sounds normal. No respiratory distress. Abdominal: Soft. Bowel sounds are normal, no distension. There is no tenderness. no masses MALE GENITALIA:not done - did not get undressed  RECTAL: not done  Musculoskeletal: Normal range of motion, no joint effusions. No gross deformities Neurological: he is alert and oriented to person, place, and time. No cranial nerve deficit. Coordination, balance, strength, speech and gait are normal.  Skin: Skin is warm and dry. No rash noted. No erythema.  Psychiatric: Patient has a normal mood and affect. behavior is normal. Judgment and thought content normal.   Recent Results (from the past 2160 hour(s))  PSA     Status: None   Collection Time: 02/02/22  9:49 AM  Result Value Ref Range   Prostate Specific Ag, Serum 1.9 0.0 - 4.0 ng/mL    Comment: Roche ECLIA methodology. According to the American Urological Association, Serum PSA should decrease and remain at undetectable levels after radical prostatectomy. The AUA defines biochemical recurrence as an initial PSA value 0.2 ng/mL or greater followed by a subsequent confirmatory PSA value 0.2 ng/mL or greater. Values obtained with different assay methods or kits cannot be used interchangeably. Results cannot be interpreted as absolute evidence of the presence or absence of malignant disease.   Testosterone     Status: None   Collection Time: 02/02/22  9:49 AM  Result Value Ref Range   Testosterone 807 264 - 916 ng/dL    Comment: Adult male reference interval is based on a population of healthy nonobese males (BMI <30) between 34 and 28 years  old. [place], [place] [phone]. PMID: [id number].   Surgical pathology     Status: None   Collection Time: 02/08/22 10:00 AM  Result Value Ref Range   SURGICAL PATHOLOGY      SURGICAL PATHOLOGY CASE: [id number] PATIENT: Alda Lea Surgical Pathology Report     Specimen Submitted: A. Colon polyp, transverse; cbx  Clinical History: Screening colonoscopy.  Colon polyps      DIAGNOSIS: A.  TRANSVERSE COLON, "POLYP"; COLD BIOPSY: - TUBULAR ADENOMA. - NEGATIVE FOR HIGH-GRADE DYSPLASIA AND MALIGNANCY.   GROSS DESCRIPTION: A. Labeled: Cbx polyp transverse colon Received: Formalin Collection time: 10:00 AM on 02/08/2022 Placed into formalin time: 10:00 AM on 02/08/2022 Tissue fragment(s): 1 Size: 0.5 x 0.4 x 0.1 cm Description: Tan soft tissue fragment Entirely submitted in 1 cassette.  CM 02/08/2022  Final Diagnosis performed by Tomasa Blase, MD.  Electronically signed 02/09/2022 9:15:41AM The electronic signature indicates that the named Attending Pathologist has evaluated the specimen Technical component performed at [facility], [address], [place], [place] [address] Lab: [phone] Dir: Rush Farmer, MD, MMM  Profe ssional component performed at [facility], [facility], [place], [place],  [address] Lab: [phone] Dir: Kathi Simpers, MD      Fall Risk:    02/22/2022    7:56 AM 01/13/2022   11:43 AM 08/19/2021    8:25 AM 11/24/2020   10:12 AM 07/08/2020    3:29 PM  Fall Risk   Falls in the past year? 0 0 0 0 0  Number falls in past yr: 0  0 0 0  Injury with Fall? 0  0 0 0  Risk for fall due to : No Fall Risks No Fall Risks No Fall Risks No Fall Risks   Follow up Falls prevention discussed Education provided;Falls evaluation completed;Falls prevention discussed Falls prevention discussed Falls prevention discussed      Functional Status Survey: Is the patient deaf or have difficulty hearing?:  Yes Does the patient have difficulty seeing, even when wearing glasses/contacts?: No Does the patient have difficulty concentrating, remembering, or making decisions?: No Does the patient have difficulty walking or climbing stairs?: No Does the patient have difficulty dressing or bathing?: No Does the patient have difficulty doing errands alone such as visiting a doctor's office or shopping?: No    Assessment & Plan  1. Well adult exam   2. Benign essential HTN  Double dose of amlodipine and return in one month for bp check  - amLODipine (NORVASC) 5 MG tablet; Take 1 tablet (5 mg total) by mouth daily.  Dispense: 90 tablet; Refill: 0 - losartan (COZAAR) 100 MG tablet; TAKE 1 TABLET(100 MG) BY MOUTH DAILY  Dispense: 90 tablet; Refill: 0  3. Need for pneumococcal 20-valent conjugate vaccination  Refused     -Prostate cancer screening and PSA options (with potential risks and benefits of testing vs not testing) were discussed along with recent recs/guidelines. -USPSTF grade A and B recommendations reviewed with patient; age-appropriate recommendations, preventive care, screening tests, etc discussed and encouraged; healthy living encouraged; see AVS for patient education given to patient -Discussed importance of 150 minutes of physical activity weekly, eat two servings of fish weekly, eat one serving of tree nuts ( cashews, pistachios, pecans, almonds.Marland Kitchen) every other day, eat 6 servings of fruit/vegetables daily and drink plenty of water and avoid sweet beverages.  -Reviewed Health Maintenance: yes

## 2022-02-18 NOTE — Patient Instructions (Signed)
Preventive Care 65 Years and Older, Male Preventive care refers to lifestyle choices and visits with your health care provider that can promote health and wellness. Preventive care visits are also called wellness exams. What can I expect for my preventive care visit? Counseling During your preventive care visit, your health care provider may ask about your: Medical history, including: Past medical problems. Family medical history. History of falls. Current health, including: Emotional well-being. Home life and relationship well-being. Sexual activity. Memory and ability to understand (cognition). Lifestyle, including: Alcohol, nicotine or tobacco, and drug use. Access to firearms. Diet, exercise, and sleep habits. Work and work environment. Sunscreen use. Safety issues such as seatbelt and bike helmet use. Physical exam Your health care provider will check your: Height and weight. These may be used to calculate your BMI (body mass index). BMI is a measurement that tells if you are at a healthy weight. Waist circumference. This measures the distance around your waistline. This measurement also tells if you are at a healthy weight and may help predict your risk of certain diseases, such as type 2 diabetes and high blood pressure. Heart rate and blood pressure. Body temperature. Skin for abnormal spots. What immunizations do I need?  Vaccines are usually given at various ages, according to a schedule. Your health care provider will recommend vaccines for you based on your age, medical history, and lifestyle or other factors, such as travel or where you work. What tests do I need? Screening Your health care provider may recommend screening tests for certain conditions. This may include: Lipid and cholesterol levels. Diabetes screening. This is done by checking your blood sugar (glucose) after you have not eaten for a while (fasting). Hepatitis C test. Hepatitis B test. HIV (human  immunodeficiency virus) test. STI (sexually transmitted infection) testing, if you are at risk. Lung cancer screening. Colorectal cancer screening. Prostate cancer screening. Abdominal aortic aneurysm (AAA) screening. You may need this if you are a current or former smoker. Talk with your health care provider about your test results, treatment options, and if necessary, the need for more tests. Follow these instructions at home: Eating and drinking  Eat a diet that includes fresh fruits and vegetables, whole grains, lean protein, and low-fat dairy products. Limit your intake of foods with high amounts of sugar, saturated fats, and salt. Take vitamin and mineral supplements as recommended by your health care provider. Do not drink alcohol if your health care provider tells you not to drink. If you drink alcohol: Limit how much you have to 0-2 drinks a day. Know how much alcohol is in your drink. In the [place] one drink equals one 12 oz bottle of beer (355 mL), one 5 oz glass of wine (148 mL), or one 1 oz glass of hard liquor (44 mL). Lifestyle Brush your teeth every morning and night with fluoride toothpaste. Floss one time each day. Exercise for at least 30 minutes 5 or more days each week. Do not use any products that contain nicotine or tobacco. These products include cigarettes, chewing tobacco, and vaping devices, such as e-cigarettes. If you need help quitting, ask your health care provider. Do not use drugs. If you are sexually active, practice safe sex. Use a condom or other form of protection to prevent STIs. Take aspirin only as told by your health care provider. Make sure that you understand how much to take and what form to take. Work with your health care provider to find out whether it is safe   and beneficial for you to take aspirin daily. Ask your health care provider if you need to take a cholesterol-lowering medicine (statin). Find healthy ways to manage stress, such  as: Meditation, yoga, or listening to music. Journaling. Talking to a trusted person. Spending time with friends and family. Safety Always wear your seat belt while driving or riding in a vehicle. Do not drive: If you have been drinking alcohol. Do not ride with someone who has been drinking. When you are tired or distracted. While texting. If you have been using any mind-altering substances or drugs. Wear a helmet and other protective equipment during sports activities. If you have firearms in your house, make sure you follow all gun safety procedures. Minimize exposure to UV radiation to reduce your risk of skin cancer. What's next? Visit your health care provider once a year for an annual wellness visit. Ask your health care provider how often you should have your eyes and teeth checked. Stay up to date on all vaccines. This information is not intended to replace advice given to you by your health care provider. Make sure you discuss any questions you have with your health care provider. Document Revised: 08/12/2020 Document Reviewed: 08/12/2020 Elsevier Patient Education  2023 [employer]  

## 2022-02-22 ENCOUNTER — Encounter: Payer: Self-pay | Admitting: Family Medicine

## 2022-02-22 ENCOUNTER — Ambulatory Visit (INDEPENDENT_AMBULATORY_CARE_PROVIDER_SITE_OTHER): Payer: Medicare Other | Admitting: Family Medicine

## 2022-02-22 VITALS — BP 150/86 | HR 91 | Resp 16 | Ht 76.0 in | Wt 222.0 lb

## 2022-02-22 DIAGNOSIS — Z0000 Encounter for general adult medical examination without abnormal findings: Secondary | ICD-10-CM | POA: Diagnosis not present

## 2022-02-22 DIAGNOSIS — I10 Essential (primary) hypertension: Secondary | ICD-10-CM | POA: Diagnosis not present

## 2022-02-22 DIAGNOSIS — Z23 Encounter for immunization: Secondary | ICD-10-CM | POA: Diagnosis not present

## 2022-02-22 MED ORDER — AMLODIPINE BESYLATE 5 MG PO TABS: 5.0000 mg | ORAL_TABLET | Freq: Every day | ORAL | 0 refills | Status: DC

## 2022-02-22 MED ORDER — LOSARTAN POTASSIUM 100 MG PO TABS: ORAL_TABLET | ORAL | 0 refills | Status: DC

## 2022-02-23 ENCOUNTER — Other Ambulatory Visit: Payer: Self-pay | Admitting: Urology

## 2022-02-24 MED ORDER — TADALAFIL 20 MG PO TABS: ORAL_TABLET | ORAL | 0 refills | Status: DC

## 2022-03-22 ENCOUNTER — Encounter: Payer: Self-pay | Admitting: Urology

## 2022-03-28 ENCOUNTER — Encounter: Payer: Self-pay | Admitting: Urology

## 2022-03-28 NOTE — Progress Notes (Deleted)
Name: Kristopher Davis   MRN: [id number]    DOB: 08-Aug-1955   Date:03/28/2022       Progress Note  Subjective  Chief Complaint  Follow Up  HPI  Dysthymia/insomnia: He has been working for the past 18 years for the same company, but it was bought out by another company about in 2021.He came in in March 22  with his wife because she noticed he was not acting his normal self. He was not getting up in the mornings, he was coming home very stressed from work, bp was  spiking , feeling tired all the time, lack of motivation, he was  second guessing what he was doing at work, not sleeping well at night.  He states it started due to change in supervisors, being short staffed, he was doing the work for 4 people. He started to make mistakes at work because he was feeling overwhelmed and unable to keep up. He did not get reprimanded  at work but he felt like he could not keep up. I filled out short term disability forms and advised him to see therapist so he could get help before going back to work. He did not want to start medications. . He quit his job August 2022. He is feeling well, he is not going back to psychiatrist. He is working part time now, he is sleeping a little better, about 6 hours per night  Doing well since retirement .  Cognitive dysfunction: B1 and folic acid were low but normalized, his B12 is still low, offered B12 injections but he wants to wait until tomorrow . He is only taking a MVI at this time   Radiculitis: he states initially was a burning sensation on posterior thigh, that has resolved, he still has some burning on the bottom of his foot when he goes to goes to bed at night, he needs to take his foot from under the covers. He has a history of back surgery but no pain on his back. No weakness, bowel or bladder incontinence   HTN he is now taking losartan 100 mg but stopped  Atenolol on his own - BP was at goal on his last visit but today is very high again, we will add norvasc 2.5 mg  today , heart rate is okay. No chest pain , palpitation or SOB  Atherosclerosis of aorta: found on CT abdomen back in 2019, last LDL at goal , still explained he needs to have statin therapy   Dermatitis: he had another flare on his face last year, seen by a Dermatologist in [place] and is using topical medication, he is doing well, no problems at this time    seen by Dr. Marolyn Hammock back in 2019 with the same rash, had a biopsy. Results below  Right flank, punch - Delicate parakeratosis, mild spongiosis and slight basal layer vacuolization, see comment Electronically signed by Jonn Shingles, MD on 08/01/2017 at [id number] Comment The mixed pattern of spongiosis and vacuolar change to the basal layer suggests a drug reaction. 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 A photo related dermatitis cannot be excluded. The biopsy does not have characteristic changes of psoriasis or pityriasis rubra pilaris. No atypical lymphocytes are found. If the patient's condition persists or worsens additional biopsy may be helpful in further characterizing the process.  Patient Active Problem List   Diagnosis Date Noted   Adenomatous polyp of colon 02/08/2022   Cognitive dysfunction 08/19/2021   Vitamin B1 deficiency 08/19/2021  Folate deficiency 08/19/2021   Dysthymia 08/19/2021   Radiculitis of leg 08/19/2021   Erectile dysfunction 12/04/2019   Other hyperlipidemia 12/03/2019   Aortic atherosclerosis ([place]) 08/24/2017   Tinea 08/11/2014   Lipoma 10/28/2009   Disease of hair and hair follicles [id number]   Benign essential HTN 01/07/2009   Decreased libido 02/01/2007   CD (contact dermatitis) 10/10/2006    Past Surgical History:  Procedure Laterality Date   COLONOSCOPY WITH PROPOFOL N/A 02/08/2022   Procedure: COLONOSCOPY WITH PROPOFOL;  Surgeon: Jonathon Bellows, MD;  Location: [facility] ENDOSCOPY;  Service: Gastroenterology;  Laterality: N/A;   LUMBAR LAMINECTOMY     3 surgeries    Family History   Problem Relation Age of Onset   Cancer Mother    Hypertension Sister    Anemia Daughter     Social History   Tobacco Use   Smoking status: Former    Packs/day: 0.25    Years: 12.00    Total pack years: 3.00    Types: Cigarettes    Quit date: 12/24/2019    Years since quitting: 2.2   Smokeless tobacco: Former    Quit date: 08/21/2017  Substance Use Topics   Alcohol use: Yes    Alcohol/week: 2.0 standard drinks of alcohol    Types: 2 Cans of beer per week     Current Outpatient Medications:    amLODipine (NORVASC) 5 MG tablet, Take 1 tablet (5 mg total) by mouth daily., Disp: 90 tablet, Rfl: 0   aspirin EC 81 MG tablet, Take 1 tablet (81 mg total) by mouth daily., Disp: 30 tablet, Rfl: 11   Cyanocobalamin (VITAMIN B-12) 1000 MCG SUBL, Place 1 tablet (1,000 mcg total) under the tongue daily at 2 PM., Disp: 100 tablet, Rfl: 1   losartan (COZAAR) 100 MG tablet, TAKE 1 TABLET(100 MG) BY MOUTH DAILY, Disp: 90 tablet, Rfl: 0   Multiple Vitamin (MULTIVITAMIN) tablet, Take 1 tablet by mouth 2 (two) times daily., Disp: , Rfl:    tadalafil (CIALIS) 20 MG tablet, Take 1 tab 1 hour prior to intercourse, Disp: 6 tablet, Rfl: 0  Allergies  Allergen Reactions   Levofloxacin In D5w     hallucinations    I personally reviewed active problem list, medication list, allergies, family history, social history, health maintenance with the patient/caregiver today.   ROS  ***  Objective  There were no vitals filed for this visit.  There is no height or weight on file to calculate BMI.  Physical Exam ***   PHQ2/9:    02/22/2022    7:56 AM 01/13/2022   11:44 AM 08/19/2021    8:25 AM 11/24/2020   10:12 AM 07/08/2020    3:35 PM  Depression screen PHQ 2/9  Decreased Interest 0 0 0 0 3  Down, Depressed, Hopeless 0 0 0 0 1  PHQ - 2 Score 0 0 0 0 4  Altered sleeping 0 0 0  1  Tired, decreased energy 0 0 0  2  Change in appetite 0 0 0  3  Feeling bad or failure about yourself  0 0 0   0  Trouble concentrating 0 0 0  0  Moving slowly or fidgety/restless 0 0 0  0  Suicidal thoughts 0 0 0  0  PHQ-9 Score 0 0 0  10    phq 9 is {gen pos [id number]   Fall Risk:    02/22/2022    7:56 AM 01/13/2022   11:43 AM 08/19/2021  8:25 AM 11/24/2020   10:12 AM 07/08/2020    3:29 PM  Fall Risk   Falls in the past year? 0 0 0 0 0  Number falls in past yr: 0  0 0 0  Injury with Fall? 0  0 0 0  Risk for fall due to : No Fall Risks No Fall Risks No Fall Risks No Fall Risks   Follow up Falls prevention discussed Education provided;Falls evaluation completed;Falls prevention discussed Falls prevention discussed Falls prevention discussed       Functional Status Survey:      Assessment & Plan  *** There are no diagnoses linked to this encounter.

## 2022-03-29 ENCOUNTER — Ambulatory Visit: Payer: Medicare Other | Admitting: Family Medicine

## 2022-03-29 NOTE — Progress Notes (Unsigned)
Name: Kristopher Davis   MRN: [id number]    DOB: 21-Dec-1955   Date:03/30/2022       Progress Note  Subjective  Chief Complaint  Follow Up  HPI  Dysthymia/insomnia: He has been working for the past 18 years for the same company, but it was bought out by another company about in 2021.He came in in March 22  with his wife because she noticed he was not acting his normal self. He was not getting up in the mornings, he was coming home very stressed from work, bp was  spiking , feeling tired all the time, lack of motivation, he was  second guessing what he was doing at work, not sleeping well at night.  He states it started due to change in supervisors, being short staffed, he was doing the work for 4 people. He started to make mistakes at work because he was feeling overwhelmed and unable to keep up. He did not get reprimanded  at work but he felt like he could not keep up. I filled out short term disability forms and advised him to see therapist so he could get help before going back to work. He did not want to start medications. . He quit his job August 2022. He is feeling well, he is not going back to psychiatrist. He is working part time now, he is sleeping a little better except the nights that he has to go to work, gets anxious and wakes up in the middle of the night   Cognitive dysfunction: B1 and folic acid were low but normalized, his B12 was still low, he gets B12 when he comes in but explained importance of coming monthly he still has some tingling on both feet.   HTN he is now taking losartan 100 mg and also norvasc 5 mg but bp at home has been between 140's and 150's, sometimes takes two of the norvasc pills. We will adjust dose to 10 mg daily, no side effects. Denies chest pain, palpitation, headaches or SOB   Atherosclerosis of aorta: found on CT abdomen back in 2019, last LDL at goal , he refuses statin therapy  Dermatitis: usually on face,   seen by a Dermatologist in [place] and is using  topical medication, he is doing well at this time    Previously seen by Dr. Marolyn Hammock back in 2019 with the same rash, had a biopsy. Results below  Right flank, punch  - Delicate parakeratosis, mild spongiosis and slight basal layer vacuolization, see comment  Comment The mixed pattern of spongiosis and vacuolar change to the basal layer suggests a drug reaction. 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 A photo related dermatitis cannot be excluded. The biopsy does not have characteristic changes of psoriasis or pityriasis rubra pilaris. No atypical lymphocytes are found. If the patient's condition persists or worsens additional biopsy may be helpful in further characterizing the process.  Patient Active Problem List   Diagnosis Date Noted   Adenomatous polyp of colon 02/08/2022   Cognitive dysfunction 08/19/2021   Vitamin B1 deficiency 08/19/2021   Folate deficiency 08/19/2021   Dysthymia 08/19/2021   Radiculitis of leg 08/19/2021   Erectile dysfunction 12/04/2019   Other hyperlipidemia 12/03/2019   Aortic atherosclerosis ([place]) 08/24/2017   Tinea 08/11/2014   Lipoma 10/28/2009   Disease of hair and hair follicles 88/91/6945   Benign essential HTN 01/07/2009   Decreased libido 02/01/2007   CD (contact dermatitis) 10/10/2006    Past Surgical History:  Procedure Laterality  Date   COLONOSCOPY WITH PROPOFOL N/A 02/08/2022   Procedure: COLONOSCOPY WITH PROPOFOL;  Surgeon: Jonathon Bellows, MD;  Location: [facility] ENDOSCOPY;  Service: Gastroenterology;  Laterality: N/A;   LUMBAR LAMINECTOMY     3 surgeries    Family History  Problem Relation Age of Onset   Cancer Mother    Hypertension Sister    Anemia Daughter     Social History   Tobacco Use   Smoking status: Former    Packs/day: 0.25    Years: 12.00    Total pack years: 3.00    Types: Cigarettes    Quit date: 12/24/2019    Years since quitting: 2.2   Smokeless tobacco: Former    Quit date: 08/21/2017  Substance Use  Topics   Alcohol use: Yes    Alcohol/week: 2.0 standard drinks of alcohol    Types: 2 Cans of beer per week     Current Outpatient Medications:    aspirin EC 81 MG tablet, Take 1 tablet (81 mg total) by mouth daily., Disp: 30 tablet, Rfl: 11   Cyanocobalamin (VITAMIN B-12) 1000 MCG SUBL, Place 1 tablet (1,000 mcg total) under the tongue daily at 2 PM., Disp: 100 tablet, Rfl: 1   losartan (COZAAR) 100 MG tablet, TAKE 1 TABLET(100 MG) BY MOUTH DAILY, Disp: 90 tablet, Rfl: 0   Multiple Vitamin (MULTIVITAMIN) tablet, Take 1 tablet by mouth 2 (two) times daily., Disp: , Rfl:    tadalafil (CIALIS) 20 MG tablet, Take 1 tab 1 hour prior to intercourse, Disp: 6 tablet, Rfl: 0   amLODipine (NORVASC) 10 MG tablet, Take 1 tablet (10 mg total) by mouth daily., Disp: 90 tablet, Rfl: 0  Allergies  Allergen Reactions   Levofloxacin In D5w     hallucinations    I personally reviewed active problem list, medication list, allergies, family history, social history, health maintenance with the patient/caregiver today.   ROS  Constitutional: Negative for fever or weight change.  Respiratory: Negative for cough and shortness of breath.   Cardiovascular: Negative for chest pain or palpitations.  Gastrointestinal: Negative for abdominal pain, no bowel changes.  Musculoskeletal: Negative for gait problem or joint swelling.  Skin: Negative for rash.  Neurological: Negative for dizziness or headache.  No other specific complaints in a complete review of systems (except as listed in HPI above).   Objective  Vitals:   03/30/22 1034 03/30/22 1048  BP: (!) 156/98 (!) 144/92  Pulse: 97   Resp: 16   Temp: 99.1 F (37.3 C)   TempSrc: Oral   SpO2: 98%   Weight: 223 lb 3.2 oz (101.2 kg)   Height: '6\' 4"'$  (1.93 m)     Body mass index is 27.17 kg/m.  Physical Exam  Constitutional: Patient appears well-developed and well-nourished. No distress.  HEENT: head atraumatic, normocephalic, pupils equal and  reactive to light, neck supple Cardiovascular: Normal rate, regular rhythm and normal heart sounds.  No murmur heard. No BLE edema. Pulmonary/Chest: Effort normal and breath sounds normal. No respiratory distress. Abdominal: Soft.  There is no tenderness. Psychiatric: Patient has a normal mood and affect. behavior is normal. Judgment and thought content normal.    PHQ2/9:    03/30/2022   10:34 AM 02/22/2022    7:56 AM 01/13/2022   11:44 AM 08/19/2021    8:25 AM 11/24/2020   10:12 AM  Depression screen PHQ 2/9  Decreased Interest 0 0 0 0 0  Down, Depressed, Hopeless 0 0 0 0 0  PHQ -  2 Score 0 0 0 0 0  Altered sleeping 0 0 0 0   Tired, decreased energy 0 0 0 0   Change in appetite 0 0 0 0   Feeling bad or failure about yourself  0 0 0 0   Trouble concentrating 0 0 0 0   Moving slowly or fidgety/restless 0 0 0 0   Suicidal thoughts 0 0 0 0   PHQ-9 Score 0 0 0 0   Difficult doing work/chores Not difficult at all        phq 9 is negative   Fall Risk:    03/30/2022   10:33 AM 02/22/2022    7:56 AM 01/13/2022   11:43 AM 08/19/2021    8:25 AM 11/24/2020   10:12 AM  Fall Risk   Falls in the past year? 0 0 0 0 0  Number falls in past yr: 0 0  0 0  Injury with Fall? 0 0  0 0  Risk for fall due to : No Fall Risks No Fall Risks No Fall Risks No Fall Risks No Fall Risks  Follow up Falls prevention discussed;Education provided;Falls evaluation completed Falls prevention discussed Education provided;Falls evaluation completed;Falls prevention discussed Falls prevention discussed Falls prevention discussed      Functional Status Survey: Is the patient deaf or have difficulty hearing?: Yes Does the patient have difficulty seeing, even when wearing glasses/contacts?: No Does the patient have difficulty concentrating, remembering, or making decisions?: No Does the patient have difficulty walking or climbing stairs?: No Does the patient have difficulty dressing or bathing?: No Does the  patient have difficulty doing errands alone such as visiting a doctor's office or shopping?: No    Assessment & Plan  1. B12 deficiency  - cyanocobalamin (VITAMIN B12) injection 1,000 mcg  2. Aortic atherosclerosis ([place])  He refuses statin therapy  3. Vitamin B1 deficiency  Last level normalized  4. Benign essential HTN  - amLODipine (NORVASC) 10 MG tablet; Take 1 tablet (10 mg total) by mouth daily.  Dispense: 90 tablet; Refill: 0  5. Dysthymia   6. Cognitive dysfunction  stable  7. Folate deficiency  Continue supplements

## 2022-03-30 ENCOUNTER — Encounter: Payer: Self-pay | Admitting: Family Medicine

## 2022-03-30 ENCOUNTER — Ambulatory Visit (INDEPENDENT_AMBULATORY_CARE_PROVIDER_SITE_OTHER): Payer: Medicare Other | Admitting: Family Medicine

## 2022-03-30 VITALS — BP 144/92 | HR 97 | Temp 99.1°F | Resp 16 | Ht 76.0 in | Wt 223.2 lb

## 2022-03-30 DIAGNOSIS — F341 Dysthymic disorder: Secondary | ICD-10-CM

## 2022-03-30 DIAGNOSIS — E538 Deficiency of other specified B group vitamins: Secondary | ICD-10-CM

## 2022-03-30 DIAGNOSIS — E519 Thiamine deficiency, unspecified: Secondary | ICD-10-CM | POA: Diagnosis not present

## 2022-03-30 DIAGNOSIS — F09 Unspecified mental disorder due to known physiological condition: Secondary | ICD-10-CM

## 2022-03-30 DIAGNOSIS — I10 Essential (primary) hypertension: Secondary | ICD-10-CM

## 2022-03-30 DIAGNOSIS — I700 Atherosclerosis of aorta: Secondary | ICD-10-CM | POA: Diagnosis not present

## 2022-03-30 MED ORDER — AMLODIPINE BESYLATE 10 MG PO TABS: 10.0000 mg | ORAL_TABLET | Freq: Every day | ORAL | 0 refills | Status: DC

## 2022-03-30 MED ORDER — CYANOCOBALAMIN 1000 MCG/ML IJ SOLN
1000.0000 ug | Freq: Once | INTRAMUSCULAR | Status: AC
  Administered 2022-03-30: 1000 ug via INTRAMUSCULAR

## 2022-05-13 ENCOUNTER — Other Ambulatory Visit: Payer: Self-pay | Admitting: Family Medicine

## 2022-05-13 DIAGNOSIS — I10 Essential (primary) hypertension: Secondary | ICD-10-CM

## 2022-06-21 DIAGNOSIS — H2513 Age-related nuclear cataract, bilateral: Secondary | ICD-10-CM | POA: Diagnosis not present

## 2022-06-21 DIAGNOSIS — H35033 Hypertensive retinopathy, bilateral: Secondary | ICD-10-CM | POA: Diagnosis not present

## 2022-06-21 DIAGNOSIS — H40013 Open angle with borderline findings, low risk, bilateral: Secondary | ICD-10-CM | POA: Diagnosis not present

## 2022-06-21 DIAGNOSIS — H5203 Hypermetropia, bilateral: Secondary | ICD-10-CM | POA: Diagnosis not present

## 2022-06-27 NOTE — Progress Notes (Deleted)
Name: Kristopher Davis   MRN: [id number]    DOB: Jul 01, 1955   Date:06/27/2022       Progress Note  Subjective  Chief Complaint  Follow Up  HPI  Dysthymia/insomnia: He has been working for the past 18 years for the same company, but it was bought out by another company about in 2021.He came in in March 22  with his wife because she noticed he was not acting his normal self. He was not getting up in the mornings, he was coming home very stressed from work, bp was  spiking , feeling tired all the time, lack of motivation, he was  second guessing what he was doing at work, not sleeping well at night.  He states it started due to change in supervisors, being short staffed, he was doing the work for 4 people. He started to make mistakes at work because he was feeling overwhelmed and unable to keep up. He did not get reprimanded  at work but he felt like he could not keep up. I filled out short term disability forms and advised him to see therapist so he could get help before going back to work. He did not want to start medications. . He quit his job August 2022. He is feeling well, he is not going back to psychiatrist. He is working part time now, he is sleeping a little better except the nights that he has to go to work, gets anxious and wakes up in the middle of the night   Cognitive dysfunction: B1 and folic acid were low but normalized, his B12 was still low, he gets B12 when he comes in but explained importance of coming monthly he still has some tingling on both feet.   HTN he is now taking losartan 100 mg and also norvasc 5 mg but bp at home has been between 140's and 150's, sometimes takes two of the norvasc pills. We will adjust dose to 10 mg daily, no side effects. Denies chest pain, palpitation, headaches or SOB   Atherosclerosis of aorta: found on CT abdomen back in 2019, last LDL at goal , he refuses statin therapy  Dermatitis: usually on face,   seen by a Dermatologist in [place] and is using  topical medication, he is doing well at this time    Previously seen by Dr. Archie Balboa back in 2019 with the same rash, had a biopsy. Results below  Right flank, punch  - Delicate parakeratosis, mild spongiosis and slight basal layer vacuolization, see comment  Comment The mixed pattern of spongiosis and vacuolar change to the basal layer suggests a drug reaction. 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 A photo related dermatitis cannot be excluded. The biopsy does not have characteristic changes of psoriasis or pityriasis rubra pilaris. No atypical lymphocytes are found. If the patient's condition persists or worsens additional biopsy may be helpful in further characterizing the process.  Patient Active Problem List   Diagnosis Date Noted   Adenomatous polyp of colon 02/08/2022   Cognitive dysfunction 08/19/2021   Vitamin B1 deficiency 08/19/2021   Folate deficiency 08/19/2021   Dysthymia 08/19/2021   Radiculitis of leg 08/19/2021   Erectile dysfunction 12/04/2019   Other hyperlipidemia 12/03/2019   Aortic atherosclerosis ([facility]) 08/24/2017   Tinea 08/11/2014   Lipoma 10/28/2009   Disease of hair and hair follicles 07/28/2009   Benign essential HTN 01/07/2009   Decreased libido 02/01/2007   CD (contact dermatitis) 10/10/2006    Past Surgical History:  Procedure Laterality  Date   COLONOSCOPY WITH PROPOFOL N/A 02/08/2022   Procedure: COLONOSCOPY WITH PROPOFOL;  Surgeon: Wyline Mood, MD;  Location: [facility] ENDOSCOPY;  Service: Gastroenterology;  Laterality: N/A;   LUMBAR LAMINECTOMY     3 surgeries    Family History  Problem Relation Age of Onset   Cancer Mother    Hypertension Sister    Anemia Daughter     Social History   Tobacco Use   Smoking status: Former    Packs/day: 0.25    Years: 12.00    Additional pack years: 0.00    Total pack years: 3.00    Types: Cigarettes    Quit date: 12/24/2019    Years since quitting: 2.5   Smokeless tobacco: Former    Quit  date: 08/21/2017  Substance Use Topics   Alcohol use: Yes    Alcohol/week: 2.0 standard drinks of alcohol    Types: 2 Cans of beer per week     Current Outpatient Medications:    amLODipine (NORVASC) 10 MG tablet, Take 1 tablet (10 mg total) by mouth daily., Disp: 90 tablet, Rfl: 0   aspirin EC 81 MG tablet, Take 1 tablet (81 mg total) by mouth daily., Disp: 30 tablet, Rfl: 11   Cyanocobalamin (VITAMIN B-12) 1000 MCG SUBL, Place 1 tablet (1,000 mcg total) under the tongue daily at 2 PM., Disp: 100 tablet, Rfl: 1   losartan (COZAAR) 100 MG tablet, TAKE 1 TABLET(100 MG) BY MOUTH DAILY, Disp: 90 tablet, Rfl: 0   Multiple Vitamin (MULTIVITAMIN) tablet, Take 1 tablet by mouth 2 (two) times daily., Disp: , Rfl:    tadalafil (CIALIS) 20 MG tablet, Take 1 tab 1 hour prior to intercourse, Disp: 6 tablet, Rfl: 0  Allergies  Allergen Reactions   Levofloxacin In D5w     hallucinations    I personally reviewed active problem list, medication list, allergies, family history, social history, health maintenance with the patient/caregiver today.   ROS  ***  Objective  There were no vitals filed for this visit.  There is no height or weight on file to calculate BMI.  Physical Exam ***  No results found for this or any previous visit (from the past 2160 hour(s)).   PHQ2/9:    03/30/2022   10:34 AM 02/22/2022    7:56 AM 01/13/2022   11:44 AM 08/19/2021    8:25 AM 11/24/2020   10:12 AM  Depression screen PHQ 2/9  Decreased Interest 0 0 0 0 0  Down, Depressed, Hopeless 0 0 0 0 0  PHQ - 2 Score 0 0 0 0 0  Altered sleeping 0 0 0 0   Tired, decreased energy 0 0 0 0   Change in appetite 0 0 0 0   Feeling bad or failure about yourself  0 0 0 0   Trouble concentrating 0 0 0 0   Moving slowly or fidgety/restless 0 0 0 0   Suicidal thoughts 0 0 0 0   PHQ-9 Score 0 0 0 0   Difficult doing work/chores Not difficult at all        phq 9 is {gen pos [id number]}   Fall Risk:    03/30/2022    10:33 AM 02/22/2022    7:56 AM 01/13/2022   11:43 AM 08/19/2021    8:25 AM 11/24/2020   10:12 AM  Fall Risk   Falls in the past year? 0 0 0 0 0  Number falls in past yr: 0 0  0 0  Injury with Fall? 0 0  0 0  Risk for fall due to : No Fall Risks No Fall Risks No Fall Risks No Fall Risks No Fall Risks  Follow up Falls prevention discussed;Education provided;Falls evaluation completed Falls prevention discussed Education provided;Falls evaluation completed;Falls prevention discussed Falls prevention discussed Falls prevention discussed      Functional Status Survey:      Assessment & Plan  *** There are no diagnoses linked to this encounter.

## 2022-06-28 ENCOUNTER — Ambulatory Visit: Payer: Medicare Other | Admitting: Family Medicine

## 2022-07-04 NOTE — Progress Notes (Deleted)
Name: Kristopher Davis   MRN: [id number]    DOB: 1955/05/22   Date:07/04/2022       Progress Note  Subjective  Chief Complaint  Follow Up  HPI  Dysthymia/insomnia: He has been working for the past 18 years for the same company, but it was bought out by another company about in 2021.He came in in March 22  with his wife because she noticed he was not acting his normal self. He was not getting up in the mornings, he was coming home very stressed from work, bp was  spiking , feeling tired all the time, lack of motivation, he was  second guessing what he was doing at work, not sleeping well at night.  He states it started due to change in supervisors, being short staffed, he was doing the work for 4 people. He started to make mistakes at work because he was feeling overwhelmed and unable to keep up. He did not get reprimanded  at work but he felt like he could not keep up. I filled out short term disability forms and advised him to see therapist so he could get help before going back to work. He did not want to start medications. . He quit his job August 2022. He is feeling well, he is not going back to psychiatrist. He is working part time now, he is sleeping a little better except the nights that he has to go to work, gets anxious and wakes up in the middle of the night   Cognitive dysfunction: B1 and folic acid were low but normalized, his B12 was still low, he gets B12 when he comes in but explained importance of coming monthly he still has some tingling on both feet.   HTN he is now taking losartan 100 mg and also norvasc 5 mg but bp at home has been between 140's and 150's, sometimes takes two of the norvasc pills. We will adjust dose to 10 mg daily, no side effects. Denies chest pain, palpitation, headaches or SOB   Atherosclerosis of aorta: found on CT abdomen back in 2019, last LDL at goal , he refuses statin therapy  Dermatitis: usually on face,   seen by a Dermatologist in [place] and is using  topical medication, he is doing well at this time    Previously seen by Dr. Archie Balboa back in 2019 with the same rash, had a biopsy. Results below  Right flank, punch  - Delicate parakeratosis, mild spongiosis and slight basal layer vacuolization, see comment  Comment The mixed pattern of spongiosis and vacuolar change to the basal layer suggests a drug reaction. 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 A photo related dermatitis cannot be excluded. The biopsy does not have characteristic changes of psoriasis or pityriasis rubra pilaris. No atypical lymphocytes are found. If the patient's condition persists or worsens additional biopsy may be helpful in further characterizing the process.  Patient Active Problem List   Diagnosis Date Noted   Adenomatous polyp of colon 02/08/2022   Cognitive dysfunction 08/19/2021   Vitamin B1 deficiency 08/19/2021   Folate deficiency 08/19/2021   Dysthymia 08/19/2021   Radiculitis of leg 08/19/2021   Erectile dysfunction 12/04/2019   Other hyperlipidemia 12/03/2019   Aortic atherosclerosis (HCC) 08/24/2017   Tinea 08/11/2014   Lipoma 10/28/2009   Disease of hair and hair follicles 07/28/2009   Benign essential HTN 01/07/2009   Decreased libido 02/01/2007   CD (contact dermatitis) 10/10/2006    Past Surgical History:  Procedure Laterality  Date   COLONOSCOPY WITH PROPOFOL N/A 02/08/2022   Procedure: COLONOSCOPY WITH PROPOFOL;  Surgeon: Wyline Mood, MD;  Location: [facility] ENDOSCOPY;  Service: Gastroenterology;  Laterality: N/A;   LUMBAR LAMINECTOMY     3 surgeries    Family History  Problem Relation Age of Onset   Cancer Mother    Hypertension Sister    Anemia Daughter     Social History   Tobacco Use   Smoking status: Former    Packs/day: 0.25    Years: 12.00    Additional pack years: 0.00    Total pack years: 3.00    Types: Cigarettes    Quit date: 12/24/2019    Years since quitting: 2.5   Smokeless tobacco: Former    Quit  date: 08/21/2017  Substance Use Topics   Alcohol use: Yes    Alcohol/week: 2.0 standard drinks of alcohol    Types: 2 Cans of beer per week     Current Outpatient Medications:    amLODipine (NORVASC) 10 MG tablet, Take 1 tablet (10 mg total) by mouth daily., Disp: 90 tablet, Rfl: 0   aspirin EC 81 MG tablet, Take 1 tablet (81 mg total) by mouth daily., Disp: 30 tablet, Rfl: 11   Cyanocobalamin (VITAMIN B-12) 1000 MCG SUBL, Place 1 tablet (1,000 mcg total) under the tongue daily at 2 PM., Disp: 100 tablet, Rfl: 1   losartan (COZAAR) 100 MG tablet, TAKE 1 TABLET(100 MG) BY MOUTH DAILY, Disp: 90 tablet, Rfl: 0   Multiple Vitamin (MULTIVITAMIN) tablet, Take 1 tablet by mouth 2 (two) times daily., Disp: , Rfl:    tadalafil (CIALIS) 20 MG tablet, Take 1 tab 1 hour prior to intercourse, Disp: 6 tablet, Rfl: 0  Allergies  Allergen Reactions   Levofloxacin In D5w     hallucinations    I personally reviewed active problem list, medication list, allergies, family history, social history, health maintenance with the patient/caregiver today.   ROS  ***  Objective  There were no vitals filed for this visit.  There is no height or weight on file to calculate BMI.  Physical Exam ***  No results found for this or any previous visit (from the past 2160 hour(s)).   PHQ2/9:    03/30/2022   10:34 AM 02/22/2022    7:56 AM 01/13/2022   11:44 AM 08/19/2021    8:25 AM 11/24/2020   10:12 AM  Depression screen PHQ 2/9  Decreased Interest 0 0 0 0 0  Down, Depressed, Hopeless 0 0 0 0 0  PHQ - 2 Score 0 0 0 0 0  Altered sleeping 0 0 0 0   Tired, decreased energy 0 0 0 0   Change in appetite 0 0 0 0   Feeling bad or failure about yourself  0 0 0 0   Trouble concentrating 0 0 0 0   Moving slowly or fidgety/restless 0 0 0 0   Suicidal thoughts 0 0 0 0   PHQ-9 Score 0 0 0 0   Difficult doing work/chores Not difficult at all        phq 9 is {gen pos [id number]}   Fall Risk:    03/30/2022    10:33 AM 02/22/2022    7:56 AM 01/13/2022   11:43 AM 08/19/2021    8:25 AM 11/24/2020   10:12 AM  Fall Risk   Falls in the past year? 0 0 0 0 0  Number falls in past yr: 0 0  0 0  Injury with Fall? 0 0  0 0  Risk for fall due to : No Fall Risks No Fall Risks No Fall Risks No Fall Risks No Fall Risks  Follow up Falls prevention discussed;Education provided;Falls evaluation completed Falls prevention discussed Education provided;Falls evaluation completed;Falls prevention discussed Falls prevention discussed Falls prevention discussed      Functional Status Survey:      Assessment & Plan  *** There are no diagnoses linked to this encounter.

## 2022-07-05 ENCOUNTER — Ambulatory Visit: Payer: Medicare Other | Admitting: Family Medicine

## 2022-07-05 ENCOUNTER — Encounter: Payer: Self-pay | Admitting: Family Medicine

## 2022-07-07 ENCOUNTER — Other Ambulatory Visit: Payer: Self-pay | Admitting: Family Medicine

## 2022-07-07 ENCOUNTER — Other Ambulatory Visit: Payer: Self-pay

## 2022-07-07 DIAGNOSIS — I10 Essential (primary) hypertension: Secondary | ICD-10-CM

## 2022-07-07 MED ORDER — AMLODIPINE BESYLATE 10 MG PO TABS: 10.0000 mg | ORAL_TABLET | Freq: Every day | ORAL | 0 refills | Status: DC

## 2022-07-07 NOTE — Telephone Encounter (Signed)
Dr Carlynn Purl said to send this to you and get you to call this medication in for her. She has left.

## 2022-07-08 ENCOUNTER — Other Ambulatory Visit: Payer: Self-pay | Admitting: Family Medicine

## 2022-07-08 DIAGNOSIS — I10 Essential (primary) hypertension: Secondary | ICD-10-CM

## 2022-07-12 NOTE — Progress Notes (Unsigned)
Name: Kristopher Davis   MRN: [id number]    DOB: 24-Sep-1955   Date:07/13/2022       Progress Note  Subjective  Chief Complaint  Follow Up  HPI  Dysthymia/insomnia: He has been working for the past 18 years for the same company, but it was bought out by another company about in 2021.He came in in March 22  with his wife because she noticed he was not acting his normal self. He was not getting up in the mornings, he was coming home very stressed from work, bp was  spiking , feeling tired all the time, lack of motivation, he was  second guessing what he was doing at work, not sleeping well at night.  He states it started due to change in supervisors, being short staffed, he was doing the work for 4 people. He started to make mistakes at work because he was feeling overwhelmed and unable to keep up. He did not get reprimanded  at work but he felt like he could not keep up. I filled out short term disability forms and advised him to see therapist so he could get help before going back to work. He did not want to start medications. . He quit his job August 2022. He is feeling well, he stopped seeing psychiatrist. 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 Discussed importance of taking supplements   HTN he is now taking losartan 100 mg and also norvasc 10  mg BP today is at goal, he has not been checking bp at home lately  Atherosclerosis of aorta: found on CT abdomen back in 2019, last LDL was 59 , he does not want to take statins at this time   Dermatitis: usually on face,  seen by a Dermatologist in [place] and is using topical medication, he is doing well at this time.    Previously seen by Dr. Archie Balboa back in 2019 with the same rash, had a biopsy. Results  below  Right flank, punch  - Delicate parakeratosis, mild spongiosis and slight basal layer vacuolization, see comment  Comment The mixed pattern of spongiosis and vacuolar change to the basal layer suggests a drug reaction. 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 A photo related dermatitis cannot be excluded. The biopsy does not have characteristic changes of psoriasis or pityriasis rubra pilaris. No atypical lymphocytes are found. If the patient's condition persists or worsens additional biopsy may be helpful in further characterizing the process.  Patient Active Problem List   Diagnosis Date Noted   Adenomatous polyp of colon 02/08/2022   Cognitive dysfunction 08/19/2021   Vitamin B1 deficiency 08/19/2021   Folate deficiency 08/19/2021   Dysthymia 08/19/2021   Radiculitis of leg 08/19/2021   Erectile dysfunction 12/04/2019   Other hyperlipidemia 12/03/2019   Aortic atherosclerosis (HCC) 08/24/2017   Tinea 08/11/2014   Lipoma 10/28/2009   Disease of hair and hair follicles 07/28/2009   Benign essential HTN 01/07/2009   Decreased libido 02/01/2007   CD (contact dermatitis) 10/10/2006    Past Surgical History:  Procedure Laterality Date   COLONOSCOPY WITH PROPOFOL N/A 02/08/2022   Procedure: COLONOSCOPY WITH PROPOFOL;  Surgeon: Wyline Mood, MD;  Location: [facility] ENDOSCOPY;  Service: Gastroenterology;  Laterality: N/A;   LUMBAR LAMINECTOMY  3 surgeries    Family History  Problem Relation Age of Onset   Cancer Mother    Hypertension Sister    Anemia Daughter     Social History   Tobacco Use   Smoking status: Former    Packs/day: 0.25    Years: 12.00    Additional pack years: 0.00    Total pack years: 3.00    Types: Cigarettes    Quit date: 12/24/2019    Years since quitting: 2.5   Smokeless tobacco: Former    Quit date: 08/21/2017  Substance Use Topics   Alcohol use: Yes    Alcohol/week: 2.0 standard drinks of alcohol    Types: 2 Cans of beer per  week     Current Outpatient Medications:    amLODipine (NORVASC) 10 MG tablet, Take 1 tablet (10 mg total) by mouth daily., Disp: 30 tablet, Rfl: 0   aspirin EC 81 MG tablet, Take 1 tablet (81 mg total) by mouth daily., Disp: 30 tablet, Rfl: 11   Cyanocobalamin (VITAMIN B-12) 1000 MCG SUBL, Place 1 tablet (1,000 mcg total) under the tongue daily at 2 PM. (Patient not taking: Reported on 07/13/2022), Disp: 100 tablet, Rfl: 1   losartan (COZAAR) 100 MG tablet, TAKE 1 TABLET(100 MG) BY MOUTH DAILY (Patient not taking: Reported on 07/13/2022), Disp: 7 tablet, Rfl: 0   Multiple Vitamin (MULTIVITAMIN) tablet, Take 1 tablet by mouth 2 (two) times daily. (Patient not taking: Reported on 07/13/2022), Disp: , Rfl:    tadalafil (CIALIS) 20 MG tablet, Take 1 tab 1 hour prior to intercourse (Patient not taking: Reported on 07/13/2022), Disp: 6 tablet, Rfl: 0  Allergies  Allergen Reactions   Levofloxacin In D5w     hallucinations    I personally reviewed active problem list, medication list, allergies, family history, social history, health maintenance with the patient/caregiver today.   ROS  Constitutional: Negative for fever , positive for weight change.  Respiratory: Negative for cough and shortness of breath.   Cardiovascular: Negative for chest pain or palpitations.  Gastrointestinal: Negative for abdominal pain, no bowel changes.  Musculoskeletal: Negative for gait problem or joint swelling.  Skin: Negative for rash.  Neurological: Negative for dizziness or headache.  No other specific complaints in a complete review of systems (except as listed in HPI above).   Objective  Vitals:   07/13/22 1119  BP: 138/86  Pulse: 91  Resp: 16  SpO2: 97%  Weight: 214 lb (97.1 kg)  Height: 6\' 4"  (1.93 m)    Body mass index is 26.05 kg/m.  Physical Exam  Constitutional: Patient appears well-developed and well-nourished.  No distress.  HEENT: head atraumatic, normocephalic, pupils equal and  reactive to light, neck supple Cardiovascular: Normal rate, regular rhythm and normal heart sounds.  No murmur heard. No BLE edema. Pulmonary/Chest: Effort normal and breath sounds normal. No respiratory distress. Abdominal: Soft.  There is no tenderness. Psychiatric: Patient has a normal mood and affect. behavior is normal. Judgment and thought content normal.   PHQ2/9:    07/13/2022   11:19 AM 03/30/2022   10:34 AM 02/22/2022    7:56 AM 01/13/2022   11:44 AM 08/19/2021    8:25 AM  Depression screen PHQ 2/9  Decreased Interest 0 0 0 0 0  Down, Depressed, Hopeless 0 0 0 0 0  PHQ - 2 Score 0 0 0 0 0  Altered sleeping 0 0 0 0 0  Tired, decreased energy 0 0 0 0 0  Change in  appetite 0 0 0 0 0  Feeling bad or failure about yourself  0 0 0 0 0  Trouble concentrating 0 0 0 0 0  Moving slowly or fidgety/restless 0 0 0 0 0  Suicidal thoughts 0 0 0 0 0  PHQ-9 Score 0 0 0 0 0  Difficult doing work/chores  Not difficult at all       phq 9 is negative   Fall Risk:    07/13/2022   11:19 AM 03/30/2022   10:33 AM 02/22/2022    7:56 AM 01/13/2022   11:43 AM 08/19/2021    8:25 AM  Fall Risk   Falls in the past year? 0 0 0 0 0  Number falls in past yr: 0 0 0  0  Injury with Fall? 0 0 0  0  Risk for fall due to : No Fall Risks No Fall Risks No Fall Risks No Fall Risks No Fall Risks  Follow up Falls prevention discussed Falls prevention discussed;Education provided;Falls evaluation completed Falls prevention discussed Education provided;Falls evaluation completed;Falls prevention discussed Falls prevention discussed      Functional Status Survey: Is the patient deaf or have difficulty hearing?: Yes Does the patient have difficulty seeing, even when wearing glasses/contacts?: No Does the patient have difficulty concentrating, remembering, or making decisions?: No Does the patient have difficulty walking or climbing stairs?: No Does the patient have difficulty dressing or bathing?: No Does  the patient have difficulty doing errands alone such as visiting a doctor's office or shopping?: No    Assessment & Plan  1. Aortic atherosclerosis (HCC)  Refuses statin therapy   2. B12 deficiency  - cyanocobalamin (VITAMIN B12) injection 1,000 mcg  3. Folate deficiency  - folic acid (FOLVITE) 1 MG tablet; Take 1 tablet (1 mg total) by mouth daily.  Dispense: 90 tablet; Refill: 1  4. Vitamin B1 deficiency  Discussed resuming supplementation   5. Dysthymia  Doing well   6. Benign essential HTN  Taking medication and bp is better today  7. Cognitive dysfunction  Seems better   8. Chronic dermatitis  Using prn medication  9. Erectile dysfunction, unspecified erectile dysfunction type  - tadalafil (CIALIS) 20 MG tablet; Take 1 tablet (20 mg total) by mouth every other day as needed for erectile dysfunction. Take 1 tab 1 hour prior to intercourse  Dispense: 6 tablet; Refill: 1

## 2022-07-13 ENCOUNTER — Ambulatory Visit (INDEPENDENT_AMBULATORY_CARE_PROVIDER_SITE_OTHER): Payer: Medicare Other | Admitting: Family Medicine

## 2022-07-13 ENCOUNTER — Encounter: Payer: Self-pay | Admitting: Family Medicine

## 2022-07-13 VITALS — BP 138/86 | HR 91 | Resp 16 | Ht 76.0 in | Wt 214.0 lb

## 2022-07-13 DIAGNOSIS — I700 Atherosclerosis of aorta: Secondary | ICD-10-CM

## 2022-07-13 DIAGNOSIS — F341 Dysthymic disorder: Secondary | ICD-10-CM

## 2022-07-13 DIAGNOSIS — E538 Deficiency of other specified B group vitamins: Secondary | ICD-10-CM | POA: Diagnosis not present

## 2022-07-13 DIAGNOSIS — L309 Dermatitis, unspecified: Secondary | ICD-10-CM

## 2022-07-13 DIAGNOSIS — E519 Thiamine deficiency, unspecified: Secondary | ICD-10-CM | POA: Diagnosis not present

## 2022-07-13 DIAGNOSIS — F09 Unspecified mental disorder due to known physiological condition: Secondary | ICD-10-CM

## 2022-07-13 DIAGNOSIS — I10 Essential (primary) hypertension: Secondary | ICD-10-CM | POA: Diagnosis not present

## 2022-07-13 DIAGNOSIS — N529 Male erectile dysfunction, unspecified: Secondary | ICD-10-CM

## 2022-07-13 MED ORDER — TADALAFIL 20 MG PO TABS: 20.0000 mg | ORAL_TABLET | ORAL | 1 refills | Status: DC | PRN

## 2022-07-13 MED ORDER — FOLIC ACID 1 MG PO TABS: 1.0000 mg | ORAL_TABLET | Freq: Every day | ORAL | 1 refills | Status: DC

## 2022-07-13 MED ORDER — CYANOCOBALAMIN 1000 MCG/ML IJ SOLN
1000.0000 ug | Freq: Once | INTRAMUSCULAR | Status: AC
  Administered 2022-07-13: 1000 ug via INTRAMUSCULAR

## 2022-07-13 NOTE — Patient Instructions (Signed)
You need to go to the local pharmacy to get your Tdap You also need to get folic acid 1 mg to take daily B12 1000 mcg Sublingually daily B1 otc once a week

## 2022-07-27 ENCOUNTER — Other Ambulatory Visit: Payer: Self-pay | Admitting: Family Medicine

## 2022-07-27 DIAGNOSIS — I10 Essential (primary) hypertension: Secondary | ICD-10-CM

## 2022-08-13 ENCOUNTER — Other Ambulatory Visit: Payer: Self-pay | Admitting: Family Medicine

## 2022-08-13 DIAGNOSIS — I10 Essential (primary) hypertension: Secondary | ICD-10-CM

## 2022-08-22 NOTE — Progress Notes (Unsigned)
Patient: Kristopher Davis, Male    DOB: [birth year], 67 y.o.   MRN: [id number]  Visit Date: 08/23/2022  Today's Provider: Ruel Favors, MD   AWV  Subjective:   Kristopher Davis is a 67 y.o. male who presents today for his Subsequent Annual Wellness Visit.  Patient/Caregiver input:    HPI  IPSS Questionnaire (AUA-7): Over the past month.   1)  How often have you had a sensation of not emptying your bladder completely after you finish urinating?  0 - Not at all  2)  How often have you had to urinate again less than two hours after you finished urinating? 0 - Not at all  3)  How often have you found you stopped and started again several times when you urinated?  0 - Not at all  4) How difficult have you found it to postpone urination?  0 - Not at all  5) How often have you had a weak urinary stream?  0 - Not at all  6) How often have you had to push or strain to begin urination?  0 - Not at all  7) How many times did you most typically get up to urinate from the time you went to bed until the time you got up in the morning?  1 - 1 time  Total score:  0-7 mildly symptomatic   8-19 moderately symptomatic   20-35 severely symptomatic     Review of Systems  Constitutional: Negative for fever or weight change.  Respiratory: Negative for cough and shortness of breath.   Cardiovascular: Negative for chest pain or palpitations.  Gastrointestinal: Negative for abdominal pain, no bowel changes.  Musculoskeletal: Negative for gait problem or joint swelling.  Skin: Negative for rash.  Neurological: Negative for dizziness or headache.  No other specific complaints in a complete review of systems (except as listed in HPI above).  Past Medical History:  Diagnosis Date   Decreased libido 02/01/2007   Hypertension     Past Surgical History:  Procedure Laterality Date   COLONOSCOPY WITH PROPOFOL N/A 02/08/2022   Procedure: COLONOSCOPY WITH PROPOFOL;  Surgeon: Wyline Mood, MD;  Location: [facility]  ENDOSCOPY;  Service: Gastroenterology;  Laterality: N/A;   LUMBAR LAMINECTOMY     3 surgeries    Family History  Problem Relation Age of Onset   Cancer Mother    Hypertension Sister    Anemia Daughter     Social History   Socioeconomic History   Marital status: Married    Spouse name: Not on file   Number of children: 2   Years of education: Not on file   Highest education level: Not on file  Occupational History   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   Tobacco Use   Smoking status: Former    Packs/day: 0.25    Years: 12.00    Additional pack years: 0.00    Total pack years: 3.00    Types: Cigarettes    Quit date: 12/24/2019    Years since quitting: 2.6   Smokeless tobacco: Former    Quit date: 08/21/2017  Vaping Use   Vaping Use: Never used  Substance and Sexual Activity   Alcohol use: Yes    Alcohol/week: 2.0 standard drinks of alcohol    Types: 2 Cans of beer per week   Drug use: No   Sexual activity: Yes    Partners: Female    Comment: married  Other Topics Concern   Not on file  Social History Narrative   He was a wide receiver in the [employer], [phone]   He works    Chemical engineer Strain: Low Risk  (08/23/2022)   Overall Financial Resource Strain (CARDIA)    Difficulty of Paying Living Expenses: Not hard at all  Food Insecurity: No Food Insecurity (08/23/2022)   Hunger Vital Sign    Worried About Running Out of Food in the Last Year: Never true    Ran Out of Food in the Last Year: Never true  Transportation Needs: No Transportation Needs (08/23/2022)   PRAPARE - Administrator, Civil Service (Medical): No    Lack of Transportation (Non-Medical): No  Physical Activity: Sufficiently Active (08/23/2022)   Exercise Vital Sign    Days of Exercise per Week: 3 days    Minutes of Exercise per Session: 50 min  Stress: No Stress Concern Present (08/23/2022)    [employer] of Occupational Health - Occupational Stress Questionnaire    Feeling of Stress : Not at all  Social Connections: Moderately Integrated (08/23/2022)   Social Connection and Isolation Panel [NHANES]    Frequency of Communication with Friends and Family: Three times a week    Frequency of Social Gatherings with Friends and Family: Never    Attends Religious Services: More than 4 times per year    Active Member of [employer] or Organizations: No    Attends Banker Meetings: Never    Marital Status: Married  Catering manager Violence: Not At Risk (08/23/2022)   Humiliation, Afraid, Rape, and Kick questionnaire    Fear of Current or Ex-Partner: No    Emotionally Abused: No    Physically Abused: No    Sexually Abused: No    Outpatient Encounter Medications as of 08/23/2022  Medication Sig   amLODipine (NORVASC) 10 MG tablet TAKE 1 TABLET(10 MG) BY MOUTH DAILY   cyanocobalamin (VITAMIN B12) 1000 MCG tablet Take 1,000 mcg by mouth daily.   folic acid (FOLVITE) 1 MG tablet Take 1 tablet (1 mg total) by mouth daily.   losartan (COZAAR) 100 MG tablet TAKE 1 TABLET(100 MG) BY MOUTH DAILY   aspirin EC 81 MG tablet Take 1 tablet (81 mg total) by mouth daily. (Patient not taking: Reported on 08/23/2022)   tadalafil (CIALIS) 20 MG tablet Take 1 tablet (20 mg total) by mouth every other day as needed for erectile dysfunction. Take 1 tab 1 hour prior to intercourse (Patient not taking: Reported on 08/23/2022)   [DISCONTINUED] Cyanocobalamin (VITAMIN B-12) 1000 MCG SUBL Place 1 tablet (1,000 mcg total) under the tongue daily at 2 PM. (Patient not taking: Reported on 07/13/2022)   [DISCONTINUED] Multiple Vitamin (MULTIVITAMIN) tablet Take 1 tablet by mouth 2 (two) times daily. (Patient not taking: Reported on 07/13/2022)   No facility-administered encounter medications on file as of 08/23/2022.    Allergies  Allergen Reactions   Levofloxacin In D5w     hallucinations    Care Team  Updated in EHR: Yes  Last Vision Exam: Jan 2024 Wears corrective lenses: Yes Last Dental Exam: 2023 Last Hearing Exam:failed screen - we will place a referral Wears Hearing Aids: No  Functional Ability / Safety Screening 1.  Was the timed Get Up and Go test longer than 30 seconds?  yes 2.  Does the patient need help with the phone, transportation, shopping,      preparing meals, housework, laundry, medications,  or managing money?  no 3.  Does the patient's home have:  loose throw rugs in the hallway?   no      Grab bars in the bathroom? no      Handrails on the stairs?   yes      Poor lighting?   no 4.  Has the patient noticed any hearing difficulties?   yes  Diet Recall and Exercise Regimen: eats at home most of the time, occasionally go out for lunch with his wife   Fall Risk: discussed fall prevention  See screening under Objective Information  Depression Screen: normal      08/23/2022    8:16 AM 07/13/2022   11:19 AM 03/30/2022   10:34 AM  PHQ9 SCORE ONLY  PHQ-9 Total Score 0 0 0    Advanced Directives: A voluntary discussion about advance care planning including the explanation and discussion of advance directives was discussed with the patient. Explanation about the health care proxy and living will was reviewed.  During this discussion, the patient was able to identify a health care proxy as wife- Erie Noe and plans/does not plan to fill out the paperwork required and will bring this to our office to keep on file. Does patient have a HCPOA? Yes If yes, name and contact information: Alyjah Lovingood  Does patient have a living will or MOST form?  yes  Cancer Screenings: Skin: no lesions, he has a recurrent rash on forehead and uses topical medication  Lung: Low Dose CT Chest recommended if Age 42-80 years, 30 pack-year currently smoking OR have quit w/in 15years. Patient does not qualify.  Lifestyle risk factor issued reviewed: Diet, exercise, weight management, advised patient  smoking is not healthy, nutrition/diet.   Prostate: no family history of prostate cancer, he has seen Dr. Lonna Cobb  Colon: Colonoscopy 02/08/22  Additional Screenings: Hepatitis B/HIV/Syphillis: N/A Hepatitis C Screening: 07/13/15 Intimate Partner Violence: negative AAA Screen: normal CT abdomen done in 2019   Objective:   Vitals: BP 126/70   Pulse 93   Resp 16   Ht 6\' 4"  (1.93 m)   Wt 211 lb (95.7 kg)   SpO2 96%   BMI 25.68 kg/m  Body mass index is 25.68 kg/m.  Lab Results  Component Value Date   CHOL 164 08/19/2021   CHOL 161 05/06/2020   CHOL 212 (H) 04/19/2019   Lab Results  Component Value Date   HDL 93 08/19/2021   HDL 98 05/06/2020   HDL 88 04/19/2019   Lab Results  Component Value Date   LDLCALC 59 08/19/2021   LDLCALC 48 05/06/2020   LDLCALC 110 (H) 04/19/2019   Lab Results  Component Value Date   TRIG 50 08/19/2021   TRIG 72 05/06/2020   TRIG 47 04/19/2019   Lab Results  Component Value Date   CHOLHDL 1.8 08/19/2021   CHOLHDL 1.6 05/06/2020   CHOLHDL 2.4 04/19/2019   No results found for: "LDLDIRECT"  Hearing Screening   500Hz  1000Hz  2000Hz  4000Hz   Right ear Fail Fail Fail Fail  Left ear Fail Fail Fail Fail    Physical Exam Constitutional: Patient appears well-developed and well-nourished.  No distress.  HEENT: head atraumatic, normocephalic, pupils equal and reactive to light,, neck supple, throat within normal limits Cardiovascular: Normal rate, regular rhythm and normal heart sounds.  No murmur heard. No BLE edema. Pulmonary/Chest: Effort normal and breath sounds normal. No respiratory distress. Abdominal: Soft.  There is no tenderness. Psychiatric: Patient has a normal mood and affect.  behavior is normal. Judgment and thought content normal.   Cognitive Testing - 6-CIT  Correct? Score   What year is it? yes 0 Yes = 0    No = 4  What month is it? yes 0 Yes = 0    No = 3  Remember:     Floyde [address]ins[address] [place], [place]     What  time is it? yes 0 Yes = 0    No = 3  Count backwards from 20 to 1 yes 0 Correct = 0    1 error = 2   More than 1 error = 4  Say the months of the year in reverse. yes 0 Correct = 0    1 error = 2   More than 1 error = 4  What address did I ask you to remember? yes 0 Correct = 0  1 error = 2    2 error = 4    3 error = 6    4 error = 8    All wrong = 10       TOTAL SCORE  0/28   Interpretation:  Normal  Normal (0-7) Abnormal (8-28)   Fall Risk:    08/23/2022    8:16 AM 07/13/2022   11:19 AM 03/30/2022   10:33 AM 02/22/2022    7:56 AM 01/13/2022   11:43 AM  Fall Risk   Falls in the past year? 0 0 0 0 0  Number falls in past yr: 0 0 0 0   Injury with Fall? 0 0 0 0   Risk for fall due to : No Fall Risks No Fall Risks No Fall Risks No Fall Risks No Fall Risks  Follow up Falls prevention discussed Falls prevention discussed Falls prevention discussed;Education provided;Falls evaluation completed Falls prevention discussed Education provided;Falls evaluation completed;Falls prevention discussed    Depression Screen    08/23/2022    8:16 AM 07/13/2022   11:19 AM 03/30/2022   10:34 AM 02/22/2022    7:56 AM 01/13/2022   11:44 AM  Depression screen PHQ 2/9  Decreased Interest 0 0 0 0 0  Down, Depressed, Hopeless 0 0 0 0 0  PHQ - 2 Score 0 0 0 0 0  Altered sleeping 0 0 0 0 0  Tired, decreased energy 0 0 0 0 0  Change in appetite 0 0 0 0 0  Feeling bad or failure about yourself  0 0 0 0 0  Trouble concentrating 0 0 0 0 0  Moving slowly or fidgety/restless 0 0 0 0 0  Suicidal thoughts 0 0 0 0 0  PHQ-9 Score 0 0 0 0 0  Difficult doing work/chores   Not difficult at all      No results found for this or any previous visit (from the past 2160 hour(s)).   Assessment & Plan:     1. Encounter for annual wellness exam in Medicare patient  - Hearing screening; Future - Ambulatory referral to Audiology  2. Vitamin B1 deficiency  - Vitamin B1  3. B12 deficiency  - B12 and Folate  Panel  4. Folate deficiency  - B12 and Folate Panel  5. Benign essential HTN  - CBC with Differential/Platelet - COMPLETE METABOLIC PANEL WITH GFR  6. Encounter for long-term (current) use of medications  - Ambulatory referral to Audiology  7. Aortic atherosclerosis (HCC)  - Lipid panel  8. Encounter for hearing examination after failed hearing test  -  Hearing screening; Future - Ambulatory referral to Audiology  Exercise Activities and Dietary recommendations  He will join the [facility]   Discussed health benefits of physical activity, and encouraged him to engage in regular exercise appropriate for his age and condition.   Immunization History  Administered Date(s) Administered   PFIZER(Purple Top)SARS-COV-2 Vaccination 05/11/2019, 06/01/2019, 02/10/2020    Health Maintenance  Topic Date Due   DTaP/Tdap/Td (1 - Tdap) Never done   COVID-19 Vaccine (4 - 2023-24 season) 10/29/2021   Zoster Vaccines- Shingrix (1 of 2) 09/26/2022 (Originally 11/09/1974)   Pneumonia Vaccine 61+ Years old (1 of 1 - PCV) 08/23/2023 (Originally 11/08/2020)   INFLUENZA VACCINE  09/29/2022   Medicare Annual Wellness (AWV)  08/23/2023   Colonoscopy  02/08/2029   Hepatitis C Screening  Completed   HPV VACCINES  Aged Out   He refused vaccines today     Current Outpatient Medications:    amLODipine (NORVASC) 10 MG tablet, TAKE 1 TABLET(10 MG) BY MOUTH DAILY, Disp: 90 tablet, Rfl: 0   cyanocobalamin (VITAMIN B12) 1000 MCG tablet, Take 1,000 mcg by mouth daily., Disp: , Rfl:    folic acid (FOLVITE) 1 MG tablet, Take 1 tablet (1 mg total) by mouth daily., Disp: 90 tablet, Rfl: 1   losartan (COZAAR) 100 MG tablet, TAKE 1 TABLET(100 MG) BY MOUTH DAILY, Disp: 30 tablet, Rfl: 0   aspirin EC 81 MG tablet, Take 1 tablet (81 mg total) by mouth daily. (Patient not taking: Reported on 08/23/2022), Disp: 30 tablet, Rfl: 11   tadalafil (CIALIS) 20 MG tablet, Take 1 tablet (20 mg total) by mouth every other day  as needed for erectile dysfunction. Take 1 tab 1 hour prior to intercourse (Patient not taking: Reported on 08/23/2022), Disp: 6 tablet, Rfl: 1 Medications Discontinued During This Encounter  Medication Reason   Multiple Vitamin (MULTIVITAMIN) tablet Patient Preference   Cyanocobalamin (VITAMIN B-12) 1000 MCG SUBL Patient Preference    I have personally reviewed and addressed the Medicare Annual Wellness health risk assessment questionnaire and have noted the following in the patient's chart:  A.         Medical and social history & family history B.         Use of alcohol, tobacco or illicit drugs  C.         Current medications and supplements D.         Functional and Cognitive ability and status E.         Nutritional status F.         Physical activity G.        Advance directives H.         List of other physicians I.          Hospitalizations, surgeries, and ER visits in previous 12 months J.         Vitals K.         Screenings such as hearing and vision if needed, cognitive and depression L.         Referrals and appointments   In addition, I have reviewed and discussed with patient certain preventive protocols, quality metrics, and best practice recommendations. A written personalized care plan for preventive services as well as general preventive health recommendations were provided to patient.   See attached scanned questionnaire for additional information.

## 2022-08-23 ENCOUNTER — Ambulatory Visit (INDEPENDENT_AMBULATORY_CARE_PROVIDER_SITE_OTHER): Payer: Medicare Other | Admitting: Family Medicine

## 2022-08-23 ENCOUNTER — Other Ambulatory Visit: Payer: Self-pay

## 2022-08-23 ENCOUNTER — Encounter: Payer: Self-pay | Admitting: Family Medicine

## 2022-08-23 VITALS — BP 126/70 | HR 93 | Resp 16 | Ht 76.0 in | Wt 211.0 lb

## 2022-08-23 DIAGNOSIS — I10 Essential (primary) hypertension: Secondary | ICD-10-CM | POA: Diagnosis not present

## 2022-08-23 DIAGNOSIS — Z0000 Encounter for general adult medical examination without abnormal findings: Secondary | ICD-10-CM | POA: Diagnosis not present

## 2022-08-23 DIAGNOSIS — E538 Deficiency of other specified B group vitamins: Secondary | ICD-10-CM

## 2022-08-23 DIAGNOSIS — Z01110 Encounter for hearing examination following failed hearing screening: Secondary | ICD-10-CM

## 2022-08-23 DIAGNOSIS — Z79899 Other long term (current) drug therapy: Secondary | ICD-10-CM

## 2022-08-23 DIAGNOSIS — E519 Thiamine deficiency, unspecified: Secondary | ICD-10-CM

## 2022-08-23 DIAGNOSIS — I700 Atherosclerosis of aorta: Secondary | ICD-10-CM

## 2022-08-23 LAB — CBC WITH DIFFERENTIAL/PLATELET
Basophils Absolute: 31 cells/uL (ref 0–200)
MPV: 9.7 fL (ref 7.5–12.5)
Monocytes Relative: 8.5 %

## 2022-08-23 MED ORDER — LOSARTAN POTASSIUM 100 MG PO TABS: 100.0000 mg | ORAL_TABLET | Freq: Every day | ORAL | 1 refills | Status: DC

## 2022-08-24 LAB — COMPLETE METABOLIC PANEL WITH GFR
AG Ratio: 1.5 (calc) (ref 1.0–2.5)
ALT: 14 U/L (ref 9–46)
Albumin: 4.1 g/dL (ref 3.6–5.1)
Creat: 1.23 mg/dL (ref 0.70–1.35)
Potassium: 4.5 mmol/L (ref 3.5–5.3)

## 2022-08-24 LAB — CBC WITH DIFFERENTIAL/PLATELET
Absolute Monocytes: 332 cells/uL (ref 200–950)
Eosinophils Relative: 1 %
Lymphs Abs: 1252 cells/uL (ref 850–3900)
MCH: 32.7 pg (ref 27.0–33.0)
MCHC: 34.3 g/dL (ref 32.0–36.0)
Platelets: 237 10*3/uL (ref 140–400)
RDW: 12.8 % (ref 11.0–15.0)
Total Lymphocyte: 32.1 %

## 2022-08-24 LAB — LIPID PANEL
HDL: 93 mg/dL (ref >=40)
Non-HDL Cholesterol (Calc): 84 mg/dL (calc) (ref <130)
Total CHOL/HDL Ratio: 1.9 (calc) (ref <5.0)
Triglycerides: 48 mg/dL (ref <150)

## 2022-08-27 LAB — LIPID PANEL
Cholesterol: 177 mg/dL (ref <200)
LDL Cholesterol (Calc): 70 mg/dL (calc)

## 2022-08-27 LAB — B12 AND FOLATE PANEL
Folate: >24 ng/mL
Vitamin B-12: 361 pg/mL (ref 200–1100)

## 2022-08-27 LAB — COMPLETE METABOLIC PANEL WITH GFR
AST: 22 U/L (ref 10–35)
Alkaline phosphatase (APISO): 42 U/L (ref 35–144)
BUN: 15 mg/dL (ref 7–25)
CO2: 26 mmol/L (ref 20–32)
Calcium: 9.4 mg/dL (ref 8.6–10.3)
Chloride: 107 mmol/L (ref 98–110)
Globulin: 2.8 g/dL (calc) (ref 1.9–3.7)
Glucose, Bld: 91 mg/dL (ref 65–99)
Sodium: 141 mmol/L (ref 135–146)
Total Bilirubin: 0.8 mg/dL (ref 0.2–1.2)
Total Protein: 6.9 g/dL (ref 6.1–8.1)
eGFR: 65 mL/min/{1.73_m2} (ref >=60)

## 2022-08-27 LAB — CBC WITH DIFFERENTIAL/PLATELET
Basophils Relative: 0.8 %
Eosinophils Absolute: 39 cells/uL (ref 15–500)
HCT: 43.2 % (ref 38.5–50.0)
Hemoglobin: 14.8 g/dL (ref 13.2–17.1)
MCV: 95.6 fL (ref 80.0–100.0)
Neutro Abs: 2246 cells/uL (ref 1500–7800)
Neutrophils Relative %: 57.6 %
RBC: 4.52 10*6/uL (ref 4.20–5.80)
WBC: 3.9 10*3/uL (ref 3.8–10.8)

## 2022-08-27 LAB — VITAMIN B1: Vitamin B1 (Thiamine): <6 nmol/L — ABNORMAL LOW (ref 8–30)

## 2022-08-29 HISTORY — PX: MECHANICAL THROMBECTOMY WITH AORTOGRAM AND INTERVENTION: SHX6836

## 2022-09-15 ENCOUNTER — Ambulatory Visit: Payer: Self-pay | Admitting: *Deleted

## 2022-09-15 NOTE — Telephone Encounter (Signed)
  Chief Complaint: Shortness of breath with exertion, sweating and chills. Symptoms: above  BP lower than normal Frequency: Started last night Pertinent Negatives: Patient denies dizziness, fever, runny nose, sore throat or coughing, no sick exposures. Disposition: [] ED /[] Urgent Care (no appt availability in office) / [x] Appointment(In office/virtual)/ []  [facility]/ [] Home Care/ [] Refused Recommended Disposition /[] [facility]/ []  Follow-up with PCP Additional Notes: Appt made with Della Goo, FNP for 09/16/2022 at 8:00 AM.

## 2022-09-15 NOTE — Progress Notes (Deleted)
There were no vitals taken for this visit.   Subjective:    Patient ID: Kristopher Davis, male    DOB: August 14, 1955, 67 y.o.   MRN: [id number]  HPI: Kristopher Davis is a 67 y.o. male  No chief complaint on file.   Relevant past medical, surgical, family and social history reviewed and updated as indicated. Interim medical history since our last visit reviewed. Allergies and medications reviewed and updated.  Review of Systems  Per HPI unless specifically indicated above     Objective:    There were no vitals taken for this visit.  Wt Readings from Last 3 Encounters:  08/23/22 211 lb (95.7 kg)  07/13/22 214 lb (97.1 kg)  03/30/22 223 lb 3.2 oz (101.2 kg)    Physical Exam  Results for orders placed or performed in visit on 08/23/22  Lipid panel  Result Value Ref Range   Cholesterol 177 <200 mg/dL   HDL 93 > OR = 40 mg/dL   Triglycerides 48 <578 mg/dL   LDL Cholesterol (Calc) 70 mg/dL (calc)   Total CHOL/HDL Ratio 1.9 <5.0 (calc)   Non-HDL Cholesterol (Calc) 84 <469 mg/dL (calc)  CBC with Differential/Platelet  Result Value Ref Range   WBC 3.9 3.8 - 10.8 Thousand/uL   RBC 4.52 4.20 - 5.80 Million/uL   Hemoglobin 14.8 13.2 - 17.1 g/dL   HCT 62.9 52.8 - 41.3 %   MCV 95.6 80.0 - 100.0 fL   MCH 32.7 27.0 - 33.0 pg   MCHC 34.3 32.0 - 36.0 g/dL   RDW 24.4 01.0 - 27.2 %   Platelets 237 140 - 400 Thousand/uL   MPV 9.7 7.5 - 12.5 fL   Neutro Abs 2,246 1,500 - 7,800 cells/uL   Lymphs Abs 1,252 850 - 3,900 cells/uL   Absolute Monocytes 332 200 - 950 cells/uL   Eosinophils Absolute 39 15 - 500 cells/uL   Basophils Absolute 31 0 - 200 cells/uL   Neutrophils Relative % 57.6 %   Total Lymphocyte 32.1 %   Monocytes Relative 8.5 %   Eosinophils Relative 1.0 %   Basophils Relative 0.8 %  COMPLETE METABOLIC PANEL WITH GFR  Result Value Ref Range   Glucose, Bld 91 65 - 99 mg/dL   BUN 15 7 - 25 mg/dL   Creat 5.36 6.44 - 0.34 mg/dL   eGFR 65 > OR = 60 VQ/QVZ/5.63O7   BUN/Creatinine  Ratio SEE NOTE: 6 - 22 (calc)   Sodium 141 135 - 146 mmol/L   Potassium 4.5 3.5 - 5.3 mmol/L   Chloride 107 98 - 110 mmol/L   CO2 26 20 - 32 mmol/L   Calcium 9.4 8.6 - 10.3 mg/dL   Total Protein 6.9 6.1 - 8.1 g/dL   Albumin 4.1 3.6 - 5.1 g/dL   Globulin 2.8 1.9 - 3.7 g/dL (calc)   AG Ratio 1.5 1.0 - 2.5 (calc)   Total Bilirubin 0.8 0.2 - 1.2 mg/dL   Alkaline phosphatase (APISO) 42 35 - 144 U/L   AST 22 10 - 35 U/L   ALT 14 9 - 46 U/L  B12 and Folate Panel  Result Value Ref Range   Vitamin B-12 361 200 - 1,100 pg/mL   Folate >24.0 ng/mL  Vitamin B1  Result Value Ref Range   Vitamin B1 (Thiamine) <6 (L) 8 - 30 nmol/L      Assessment & Plan:   Problem List Items Addressed This Visit   None    Follow up plan: No follow-ups  on file.

## 2022-09-15 NOTE — Telephone Encounter (Signed)
Reason for Disposition  [1] MILD difficulty breathing (e.g., minimal/no SOB at rest, SOB with walking, pulse <100) AND [2] NEW-onset or WORSE than normal  Answer Assessment - Initial Assessment Questions 1. RESPIRATORY STATUS: "Describe your breathing?" (e.g., wheezing, shortness of breath, unable to speak, severe coughing)      I'm having shortness of breath started last night when I came downstairs.   I got in bed and went to sleep.   This morning when I got up to bring my laundry basket downstairs I was out of breath and sweating.   I'm having chills all morning.  No coughing.   No fever.   No sore throat or runny nose.    2. ONSET: "When did this breathing problem begin?"      Last night 3. PATTERN "Does the difficult breathing come and go, or has it been constant since it started?"      My mother in law stays next door and I went to get her mail and I thought I was going to pass out before I got back home.     Maybe 35-40 yards.  4. SEVERITY: "How bad is your breathing?" (e.g., mild, moderate, severe)    - MILD: No SOB at rest, mild SOB with walking, speaks normally in sentences, can lie down, no retractions, pulse < 100.    - MODERATE: SOB at rest, SOB with minimal exertion and prefers to sit, cannot lie down flat, speaks in phrases, mild retractions, audible wheezing, pulse 100-120.    - SEVERE: Very SOB at rest, speaks in single words, struggling to breathe, sitting hunched forward, retractions, pulse > 120      Moderately short of breath 5. RECURRENT SYMPTOM: "Have you had difficulty breathing before?" If Yes, ask: "When was the last time?" and "What happened that time?"      No 6. CARDIAC HISTORY: "Do you have any history of heart disease?" (e.g., heart attack, angina, bypass surgery, angioplasty)      No heart problems. 7. LUNG HISTORY: "Do you have any history of lung disease?"  (e.g., pulmonary embolus, asthma, emphysema)     No 8. CAUSE: "What do you think is causing the breathing  problem?"      No sick exposures. 9. OTHER SYMPTOMS: "Do you have any other symptoms? (e.g., dizziness, runny nose, cough, chest pain, fever)     No other symptoms just short of breath, chills and sweating.   Feeling tired. 10. O2 SATURATION MONITOR:  "Do you use an oxygen saturation monitor (pulse oximeter) at home?" If Yes, ask: "What is your reading (oxygen level) today?" "What is your usual oxygen saturation reading?" (e.g., 95%)       N/A 11. PREGNANCY: "Is there any chance you are pregnant?" "When was your last menstrual period?"       N/A 12. TRAVEL: "Have you traveled out of the country in the last month?" (e.g., travel history, exposures)       Not asked My BP 103/76   then 105/67 about 25 minutes ago.   No dizziness  My normal BP is 126/70 when I was in the office.       No new BP medications.   Just my regular BP medicine without changes.  Protocols used: Breathing Difficulty-A-AH

## 2022-09-16 ENCOUNTER — Emergency Department: Payer: Medicare Other

## 2022-09-16 ENCOUNTER — Telehealth: Payer: Self-pay | Admitting: Emergency Medicine

## 2022-09-16 ENCOUNTER — Other Ambulatory Visit: Payer: Self-pay

## 2022-09-16 ENCOUNTER — Emergency Department
Admission: EM | Admit: 2022-09-16 | Discharge: 2022-09-16 | Disposition: A | Discharge status: Other Acute Inpatient Hospital | Payer: Medicare Other | Attending: Emergency Medicine | Admitting: Emergency Medicine

## 2022-09-16 ENCOUNTER — Ambulatory Visit: Payer: Medicare Other | Admitting: Nurse Practitioner

## 2022-09-16 DIAGNOSIS — R61 Generalized hyperhidrosis: Secondary | ICD-10-CM | POA: Diagnosis not present

## 2022-09-16 DIAGNOSIS — R0602 Shortness of breath: Secondary | ICD-10-CM | POA: Diagnosis not present

## 2022-09-16 DIAGNOSIS — I2602 Saddle embolus of pulmonary artery with acute cor pulmonale: Secondary | ICD-10-CM | POA: Diagnosis not present

## 2022-09-16 DIAGNOSIS — I2699 Other pulmonary embolism without acute cor pulmonale: Secondary | ICD-10-CM | POA: Insufficient documentation

## 2022-09-16 DIAGNOSIS — I824Z2 Acute embolism and thrombosis of unspecified deep veins of left distal lower extremity: Secondary | ICD-10-CM | POA: Diagnosis not present

## 2022-09-16 DIAGNOSIS — I10 Essential (primary) hypertension: Secondary | ICD-10-CM | POA: Diagnosis not present

## 2022-09-16 DIAGNOSIS — Q2112 Patent foramen ovale: Secondary | ICD-10-CM

## 2022-09-16 DIAGNOSIS — I214 Non-ST elevation (NSTEMI) myocardial infarction: Secondary | ICD-10-CM | POA: Insufficient documentation

## 2022-09-16 DIAGNOSIS — I513 Intracardiac thrombosis, not elsewhere classified: Secondary | ICD-10-CM | POA: Diagnosis not present

## 2022-09-16 DIAGNOSIS — Z1152 Encounter for screening for COVID-19: Secondary | ICD-10-CM | POA: Diagnosis not present

## 2022-09-16 DIAGNOSIS — I82442 Acute embolism and thrombosis of left tibial vein: Secondary | ICD-10-CM | POA: Diagnosis not present

## 2022-09-16 DIAGNOSIS — Z452 Encounter for adjustment and management of vascular access device: Secondary | ICD-10-CM | POA: Diagnosis not present

## 2022-09-16 DIAGNOSIS — Z9889 Other specified postprocedural states: Secondary | ICD-10-CM | POA: Diagnosis not present

## 2022-09-16 DIAGNOSIS — I82432 Acute embolism and thrombosis of left popliteal vein: Secondary | ICD-10-CM | POA: Diagnosis not present

## 2022-09-16 DIAGNOSIS — R0902 Hypoxemia: Secondary | ICD-10-CM | POA: Diagnosis not present

## 2022-09-16 DIAGNOSIS — I2510 Atherosclerotic heart disease of native coronary artery without angina pectoris: Secondary | ICD-10-CM | POA: Diagnosis not present

## 2022-09-16 DIAGNOSIS — J9601 Acute respiratory failure with hypoxia: Secondary | ICD-10-CM | POA: Diagnosis not present

## 2022-09-16 DIAGNOSIS — I2609 Other pulmonary embolism with acute cor pulmonale: Secondary | ICD-10-CM | POA: Diagnosis not present

## 2022-09-16 DIAGNOSIS — Z86711 Personal history of pulmonary embolism: Secondary | ICD-10-CM | POA: Insufficient documentation

## 2022-09-16 DIAGNOSIS — G9389 Other specified disorders of brain: Secondary | ICD-10-CM | POA: Diagnosis not present

## 2022-09-16 DIAGNOSIS — Z87891 Personal history of nicotine dependence: Secondary | ICD-10-CM | POA: Diagnosis not present

## 2022-09-16 DIAGNOSIS — R9431 Abnormal electrocardiogram [ECG] [EKG]: Secondary | ICD-10-CM | POA: Diagnosis not present

## 2022-09-16 DIAGNOSIS — R519 Headache, unspecified: Secondary | ICD-10-CM | POA: Diagnosis not present

## 2022-09-16 DIAGNOSIS — R578 Other shock: Secondary | ICD-10-CM | POA: Diagnosis not present

## 2022-09-16 DIAGNOSIS — R55 Syncope and collapse: Secondary | ICD-10-CM | POA: Diagnosis not present

## 2022-09-16 DIAGNOSIS — I510 Cardiac septal defect, acquired: Secondary | ICD-10-CM | POA: Diagnosis not present

## 2022-09-16 DIAGNOSIS — R0609 Other forms of dyspnea: Secondary | ICD-10-CM | POA: Insufficient documentation

## 2022-09-16 DIAGNOSIS — I824Y2 Acute embolism and thrombosis of unspecified deep veins of left proximal lower extremity: Secondary | ICD-10-CM | POA: Diagnosis not present

## 2022-09-16 DIAGNOSIS — G319 Degenerative disease of nervous system, unspecified: Secondary | ICD-10-CM | POA: Diagnosis not present

## 2022-09-16 DIAGNOSIS — I82452 Acute embolism and thrombosis of left peroneal vein: Secondary | ICD-10-CM | POA: Diagnosis not present

## 2022-09-16 DIAGNOSIS — I5189 Other ill-defined heart diseases: Secondary | ICD-10-CM | POA: Insufficient documentation

## 2022-09-16 DIAGNOSIS — D72829 Elevated white blood cell count, unspecified: Secondary | ICD-10-CM | POA: Diagnosis not present

## 2022-09-16 LAB — COMPREHENSIVE METABOLIC PANEL
ALT: 15 U/L (ref 0–44)
AST: 22 U/L (ref 15–41)
Albumin: 3.7 g/dL (ref 3.5–5.0)
Alkaline Phosphatase: 52 U/L (ref 38–126)
Anion gap: 14 (ref 5–15)
BUN: 10 mg/dL (ref 8–23)
CO2: 19 mmol/L — ABNORMAL LOW (ref 22–32)
Calcium: 8.9 mg/dL (ref 8.9–10.3)
Chloride: 105 mmol/L (ref 98–111)
Creatinine, Ser: 1.58 mg/dL — ABNORMAL HIGH (ref 0.61–1.24)
GFR, Estimated: 48 mL/min — ABNORMAL LOW (ref >60)
Glucose, Bld: 188 mg/dL — ABNORMAL HIGH (ref 70–99)
Potassium: 4.1 mmol/L (ref 3.5–5.1)
Sodium: 138 mmol/L (ref 135–145)
Total Bilirubin: 1.1 mg/dL (ref 0.3–1.2)
Total Protein: 7.1 g/dL (ref 6.5–8.1)

## 2022-09-16 LAB — CBC
HCT: 45.7 % (ref 39.0–52.0)
Hemoglobin: 15.3 g/dL (ref 13.0–17.0)
MCH: 32 pg (ref 26.0–34.0)
MCHC: 33.5 g/dL (ref 30.0–36.0)
MCV: 95.6 fL (ref 80.0–100.0)
Platelets: 197 10*3/uL (ref 150–400)
RBC: 4.78 MIL/uL (ref 4.22–5.81)
RDW: 12.7 % (ref 11.5–15.5)
WBC: 7.9 10*3/uL (ref 4.0–10.5)
nRBC: 0 % (ref 0.0–0.2)

## 2022-09-16 LAB — APTT: aPTT: >200 seconds (ref 24–36)

## 2022-09-16 LAB — D-DIMER, QUANTITATIVE: D-Dimer, Quant: 15.99 ug/mL-FEU — ABNORMAL HIGH (ref 0.00–0.50)

## 2022-09-16 LAB — SARS CORONAVIRUS 2 BY RT PCR: SARS Coronavirus 2 by RT PCR: NEGATIVE

## 2022-09-16 LAB — HEPARIN LEVEL (UNFRACTIONATED): Heparin Unfractionated: >1.1 IU/mL — ABNORMAL HIGH (ref 0.30–0.70)

## 2022-09-16 LAB — TROPONIN I (HIGH SENSITIVITY)
Troponin I (High Sensitivity): 241 ng/L (ref <18)
Troponin I (High Sensitivity): 187 ng/L (ref <18)

## 2022-09-16 MED ORDER — HEPARIN (PORCINE) 25000 UT/250ML-% IV SOLN
1200.0000 [IU]/h | INTRAVENOUS | Status: DC
  Administered 2022-09-16: 1200 [IU]/h via INTRAVENOUS

## 2022-09-16 MED ORDER — IOHEXOL 350 MG/ML SOLN
75.0000 mL | Freq: Once | INTRAVENOUS | Status: AC | PRN
  Administered 2022-09-16: 75 mL via INTRAVENOUS

## 2022-09-16 MED ORDER — ASPIRIN 81 MG PO CHEW
324.0000 mg | CHEWABLE_TABLET | Freq: Once | ORAL | Status: AC
  Administered 2022-09-16: 324 mg via ORAL

## 2022-09-16 MED ORDER — HEPARIN BOLUS VIA INFUSION
4000.0000 [IU] | Freq: Once | INTRAVENOUS | Status: AC
  Administered 2022-09-16: 4000 [IU] via INTRAVENOUS
  Filled 2022-09-16: qty 4000

## 2022-09-16 MED ORDER — SODIUM CHLORIDE 0.9 % IV BOLUS
1000.0000 mL | Freq: Once | INTRAVENOUS | Status: AC
  Administered 2022-09-16: 1000 mL via INTRAVENOUS

## 2022-09-16 NOTE — Progress Notes (Signed)
ANTICOAGULATION CONSULT NOTE - Initial Consult  Pharmacy Consult for Heparin Indication:  ACS / STEMI  Allergies  Allergen Reactions   Levofloxacin In D5w     hallucinations    Patient Measurements: Height: 6\' 3"  (190.5 cm) Weight: 94.3 kg (208 lb) IBW/kg (Calculated) : 84.5 Heparin Dosing Weight: 94.3 kg   Vital Signs: Temp: 98.4 F (36.9 C) (07/19 0816) Temp Source: Oral (07/19 0816) BP: 105/81 (07/19 0815) Pulse Rate: 103 (07/19 0815)  Labs: Recent Labs    09/16/22 0818  HGB 15.3  HCT 45.7  PLT 197  CREATININE 1.58*  TROPONINIHS 241*    Estimated Creatinine Clearance: 55 mL/min (A) (by C-G formula based on SCr of 1.58 mg/dL (H)).   Medical History: Past Medical History:  Diagnosis Date   Decreased libido 02/01/2007   Hypertension    Assessment: 67 yo M with h/o HTN, presenting to ED with sweating and dyspnea on exertion. Baseline labs: Hgb 15.3, Hct 45.7, Plt 197. Troponin 241. Patient not on anticoagulation PTA per chart review. Pharmacy consulted to dose heparin for ACS / STEMI.   Goal of Therapy:  Heparin level 0.3-0.7 units/ml Monitor platelets by anticoagulation protocol: Yes   Plan:  IV Heparin 4000 units x1, followed by infusion at 1200 units/hr. Check ~6 hr heparin level.  Daily CBC, heparin level. Monitor for signs/symptoms of bleeding.  Jerrilyn Cairo, PharmD 09/16/2022 10:43 AM
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
## 2022-09-16 NOTE — Assessment & Plan Note (Signed)
Noted PFO on imaging w/ clot burden  On heparin gtt  Case reviewed by vascular surgery w/ recommendation for transfer to tertiary facility

## 2022-09-16 NOTE — Consult Note (Signed)
Hospital Consult    Reason for Consult:  Bilateral Pulmonary Embolisms Requesting Physician:  Dr Sharman Cheek  MD MRN #:  [id number]  History of Present Illness: This is a 67 y.o. male with past medical history of HTN, alcohol and tobacco use presenting w/ DOE, NSTEMI, pulmonary embolism, PFO w/ clot burden. Pt reports approx 2 days of increased WOB, DOE. No significant CP. No nausea or vomiting. Denies any orthopnea or PND. 1/4 PPD smoker. Drinks 1 beer and 1-2 shots of liquor daily. SOB has significantly worsened over the past 24 hours to the point of dyspnea at rest. Vascular Surgery consulted for evaluation.   On workup patient had a CTA of the chest revealing bilateral pulmonary embolisms with a thrombus in a possible PFO. Notable labs include a troponin of 241, D-Dimer of 16. EKG is sinus rhythm.   On exam this morning the patient had just returned from ambulating to the bathroom. He was short of breath and diaphoretic. Heart rate was 90 and Oxygen saturation 92% on room air. He denies any chest pain or dizziness. He denies any history of DVT or PE.   Past Medical History:  Diagnosis Date   Decreased libido 02/01/2007   Hypertension     Past Surgical History:  Procedure Laterality Date   COLONOSCOPY WITH PROPOFOL N/A 02/08/2022   Procedure: COLONOSCOPY WITH PROPOFOL;  Surgeon: Wyline Mood, MD;  Location: [facility] ENDOSCOPY;  Service: Gastroenterology;  Laterality: N/A;   LUMBAR LAMINECTOMY     3 surgeries    Allergies  Allergen Reactions   Levofloxacin In D5w     hallucinations    Prior to Admission medications   Medication Sig Start Date End Date Taking? Authorizing Provider  cyanocobalamin (VITAMIN B12) 1000 MCG tablet Take 1,000 mcg by mouth daily.   Yes [provider]  folic acid (FOLVITE) 1 MG tablet Take 1 tablet (1 mg total) by mouth daily. 07/13/22  Yes Sowles, Danna Hefty, MD  losartan (COZAAR) 100 MG tablet Take 1 tablet (100 mg total) by mouth daily.  08/23/22  Yes Sowles, Danna Hefty, MD  tadalafil (CIALIS) 20 MG tablet Take 1 tablet (20 mg total) by mouth every other day as needed for erectile dysfunction. Take 1 tab 1 hour prior to intercourse 07/13/22  Yes Sowles, Danna Hefty, MD  amLODipine (NORVASC) 10 MG tablet TAKE 1 TABLET(10 MG) BY MOUTH DAILY Patient not taking: Reported on 09/16/2022 08/15/22   Alba Cory, MD    Social History   Socioeconomic History   Marital status: Married    Spouse name: Not on file   Number of children: 2   Years of education: Not on file   Highest education level: Some college, no degree  Occupational History   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   Tobacco Use   Smoking status: Some Days    Current packs/day: 0.25    Average packs/day: 0.3 packs/day for 36.9 years (9.2 ttl pk-yrs)    Types: Cigarettes    Start date: 11/07/1985   Smokeless tobacco: Former    Quit date: 08/21/2017  Vaping Use   Vaping status: Never Used  Substance and Sexual Activity   Alcohol use: Yes    Alcohol/week: 2.0 standard drinks of alcohol    Types: 2 Cans of beer per week   Drug use: No   Sexual activity: Yes    Partners: Female    Comment: married  Other Topics Concern   Not on  file  Social History Narrative   He was a Teacher, 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[employer], [phone]   He works    Chemical engineer Strain: Low Risk  (08/23/2022)   Overall Financial Resource Strain (CARDIA)    Difficulty of Paying Living Expenses: Not hard at all  Food Insecurity: No Food Insecurity (08/23/2022)   Hunger Vital Sign    Worried About Running Out of Food in the Last Year: Never true    Ran Out of Food in the Last Year: Never true  Transportation Needs: No Transportation Needs (08/23/2022)   PRAPARE - Administrator, Civil Service (Medical): No    Lack of Transportation (Non-Medical): No  Physical Activity: Sufficiently Active (08/23/2022)    Exercise Vital Sign    Days of Exercise per Week: 3 days    Minutes of Exercise per Session: 50 min  Stress: No Stress Concern Present (08/23/2022)   [employer] of Occupational Health - Occupational Stress Questionnaire    Feeling of Stress : Not at all  Social Connections: Moderately Integrated (08/23/2022)   Social Connection and Isolation Panel [NHANES]    Frequency of Communication with Friends and Family: Three times a week    Frequency of Social Gatherings with Friends and Family: Never    Attends Religious Services: More than 4 times per year    Active Member of [employer] or Organizations: No    Attends Banker Meetings: Never    Marital Status: Married  Catering manager Violence: Not At Risk (08/23/2022)   Humiliation, Afraid, Rape, and Kick questionnaire    Fear of Current or Ex-Partner: No    Emotionally Abused: No    Physically Abused: No    Sexually Abused: No     Family History  Problem Relation Age of Onset   Cancer Mother    Hypertension Sister    Anemia Daughter     ROS: Otherwise negative unless mentioned in HPI  Physical Examination  Vitals:   09/16/22 0815 09/16/22 0816  BP: 105/81   Pulse: (!) 103   Resp: 20   Temp:  98.4 F (36.9 C)  SpO2: 98%    Body mass index is 26 kg/m.  General:  WDWN in NAD Gait: Not observed HENT: WNL, normocephalic Pulmonary: normal non-labored breathing, without Rales, rhonchi,  wheezing Cardiac: regular, without  Murmurs, rubs or gallops; without carotid bruits Abdomen: positive bowel sounds throughout, soft, NT/ND, no masses Skin: without rashes Vascular Exam/Pulses: Palpable pulses throughout Extremities: without ischemic changes, without Gangrene , without cellulitis; without open wounds;  Musculoskeletal: no muscle wasting or atrophy  Neurologic: A&O X 3;  No focal weakness or paresthesias are detected; speech is fluent/normal Psychiatric:  The pt has Normal affect. Lymph:  Unremarkable  CBC     Component Value Date/Time   WBC 7.9 09/16/2022 0818   RBC 4.78 09/16/2022 0818   HGB 15.3 09/16/2022 0818   HGB 14.8 07/13/2015 1043   HCT 45.7 09/16/2022 0818   HCT 44.1 07/13/2015 1043   PLT 197 09/16/2022 0818   PLT 225 07/13/2015 1043   MCV 95.6 09/16/2022 0818   MCV 91 07/13/2015 1043   MCH 32.0 09/16/2022 0818   MCHC 33.5 09/16/2022 0818   RDW 12.7 09/16/2022 0818   RDW 14.6 07/13/2015 1043   LYMPHSABS 1,252 08/23/2022 0914   LYMPHSABS 1.6 07/13/2015 1043   MONOABS 0.4 12/26/2019 1509   EOSABS 39 08/23/2022 0914   EOSABS 0.0 07/13/2015 1043  BASOSABS 31 08/23/2022 0914   BASOSABS 0.0 07/13/2015 1043    BMET    Component Value Date/Time   NA 138 09/16/2022 0818   NA 139 07/13/2015 1043   K 4.1 09/16/2022 0818   CL 105 09/16/2022 0818   CO2 19 (L) 09/16/2022 0818   GLUCOSE 188 (H) 09/16/2022 0818   BUN 10 09/16/2022 0818   BUN 9 07/13/2015 1043   CREATININE 1.58 (H) 09/16/2022 0818   CREATININE 1.23 08/23/2022 0914   CALCIUM 8.9 09/16/2022 0818   GFRNONAA 48 (L) 09/16/2022 0818   GFRNONAA 68 05/06/2020 1431   GFRAA 79 05/06/2020 1431    COAGS: No results found for: "INR", "PROTIME"   Non-Invasive Vascular Imaging:     EXAM:09/16/2022 CT ANGIOGRAPHY CHEST WITH CONTRAST   TECHNIQUE: Multidetector CT imaging of the chest was performed using the standard protocol during bolus administration of intravenous contrast. Multiplanar CT image reconstructions and MIPs were obtained to evaluate the vascular anatomy.   RADIATION DOSE REDUCTION: This exam was performed according to the departmental dose-optimization program which includes automated exposure control, adjustment of the mA and/or kV according to patient size and/or use of iterative reconstruction technique.   CONTRAST:  75mL OMNIPAQUE IOHEXOL 350 MG/ML SOLN   COMPARISON:  None Available.   FINDINGS: Cardiovascular: Acute bilateral pulmonary emboli in the left and right main pulmonary  arteries, as well as all lobar branches and numerous segmental branches. Evidence of acute right heart strain with RV to LV ratio of approximately 3. A linear hypodensity appears to cross the interatrial septum (sagittal image 91 series 8), raising suspicion for clot in a patent foramen ovale. Coronary artery calcifications.   Mediastinum/Nodes: No enlarged mediastinal, hilar, or axillary lymph nodes. Thyroid gland, trachea, and esophagus demonstrate no significant findings.   Lungs/Pleura: Lungs are clear. No pleural effusion or pneumothorax.   Upper Abdomen: No acute abnormality.   Musculoskeletal: No chest wall abnormality. No acute or significant osseous findings.   Review of the MIP images confirms the above findings.   IMPRESSION: 1. Acute bilateral pulmonary emboli in the left and right main pulmonary arteries, as well as all lobar branches and numerous segmental branches. Evidence of acute right heart strain with RV to LV ratio of approximately 3. 2. A linear hypodensity appears to cross the interatrial septum, raising suspicion for clot in a patent foramen ovale. 3. Coronary artery calcifications.  Statin:  No. Beta Blocker:  No. Aspirin:  No. ACEI:  No. ARB:  Yes.   CCB use:  Yes Other antiplatelets/anticoagulants:  No.    ASSESSMENT/PLAN: This is a 67 y.o. male who presents to [facility] Emergency Department with increasing shortness of breath and worsening dyspnea on exertion over the last 2 days. Upon work up he is noted to have bilateral pulmonary embolisms with a PFO with thrombus. Patient started on Heparin Infusion in the emergency department.  PLAN: Vascular Surgery asks Cardiac Surgery be called to evaluate the patient for treatment going forward. If patient would not be a candidate for cardiac surgery plan would be to consider pulmonary thrombectomy with out the use of TPA and just treat with heparin which would not be optimal.    I discussed the plan in  detail with Dr Vilinda Flake MD and he agrees with the plan.    Marcie Bal [facility] 09/16/2022 11:58 AM

## 2022-09-16 NOTE — ED Notes (Signed)
[facility] called for possible transfer to Cardiac Thoracic surgery, Spoke with [place]. They will call us back.

## 2022-09-16 NOTE — Telephone Encounter (Signed)
Lab called informing this RN of critical APTT of more than 236. Patient was transferred to [facility] via [facility]. [facility] transfer center was called and This RN was transferred to [facility], Charity fundraiser and Susy Frizzle was informed of critical lab results.

## 2022-09-16 NOTE — Consult Note (Signed)
Initial Consultation Note   Patient: Kristopher Davis ZOX:[id number] DOB: 1955/09/08 PCP: Alba Cory, MD DOA: 09/16/2022 DOS: the patient was seen and examined on 09/16/2022 Primary service: Sharman Cheek, MD  Referring physician: Sharman Cheek, MD Reason for consult: DOE   Assessment/Plan: Assessment and Plan: NSTEMI (non-ST elevated myocardial infarction) (HCC) Trop 240s w/ noted bilateral PE w/ heart strain and PFO w/ clot burden  No active CP at present  Suspect demand ischemia in setting of bilateral PE w/ heart strain  Started on heparin gtt in ER  Pending transfer to tertiary facility for PFO w/ clot burden per vascular surgery recommendations    PFO (patent foramen ovale) Noted PFO on imaging w/ clot burden  On heparin gtt  Case reviewed by vascular surgery w/ recommendation for transfer to tertiary facility    Pulmonary emboli (HCC) Progressive SOB and DOE x 2-3 days with noted  bilateral pulmonary emboli in the left and right main pulmonary arteries and R heart strain  Started on heparin gtt  Vascular surgery consulted with recommendation for transfer in setting of PFO w/ clot present        TRH will sign off at present, please call us again when needed.  HPI: Kristopher Davis is a 67 y.o. male with past medical history of HTN, alcohol and tobacco use presenting w/ DOE, NSTEMI, pulmonary embolism, PFO w/ clot burden. Pt reports approx 2 days of increased WOB, DOE. No significant CP. No nausea or vomiting. Denies any orthopnea or PND. 1/4 PPD smoker. Drinks 1 beer and 1-2 shots of liquor daily. SOB has significantly worsened over the past 24 hours to the point of dyspnea at rest. No reported extended trips. No reported prior hx/o DVT/PEs in the past.  Presented to the ER afebrile, hemodynamically stable. Notable labs include trop 241, d dimer 16. EKG sinus tachycardia. CT head stable. CTA chest w/  Acute bilateral pulmonary emboli in the left and right main pulmonary  arteries w/ evidence of R heart strain and concern for clot in patent PFO.   Review of Systems: As mentioned in the history of present illness. All other systems reviewed and are negative. Past Medical History:  Diagnosis Date   Decreased libido 02/01/2007   Hypertension    Past Surgical History:  Procedure Laterality Date   COLONOSCOPY WITH PROPOFOL N/A 02/08/2022   Procedure: COLONOSCOPY WITH PROPOFOL;  Surgeon: Wyline Mood, MD;  Location[facility] ENDOSCOPY;  Service: Gastroenterology;  Laterality: N/A;   LUMBAR LAMINECTOMY     3 surgeries   Social History:  reports that he has been smoking cigarettes. He started smoking about 36 years ago. He has a 9.2 pack-year smoking history. He quit smokeless tobacco use about 5 years ago. He reports current alcohol use of about 2.0 standard drinks of alcohol per week. He reports that he does not use drugs.  Allergies  Allergen Reactions   Levofloxacin In D5w     hallucinations    Family History  Problem Relation Age of Onset   Cancer Mother    Hypertension Sister    Anemia Daughter     Prior to Admission medications   Medication Sig Start Date End Date Taking? Authorizing Provider  cyanocobalamin (VITAMIN B12) 1000 MCG tablet Take 1,000 mcg by mouth daily.   Yes [provider]  folic acid (FOLVITE) 1 MG tablet Take 1 tablet (1 mg total) by mouth daily. 07/13/22  Yes Sowles, Danna Hefty, MD  losartan (COZAAR) 100 MG tablet Take 1 tablet (100 mg  total) by mouth daily. 08/23/22  Yes Sowles, Danna Hefty, MD  tadalafil (CIALIS) 20 MG tablet Take 1 tablet (20 mg total) by mouth every other day as needed for erectile dysfunction. Take 1 tab 1 hour prior to intercourse 07/13/22  Yes Sowles, Danna Hefty, MD  amLODipine (NORVASC) 10 MG tablet TAKE 1 TABLET(10 MG) BY MOUTH DAILY Patient not taking: Reported on 09/16/2022 08/15/22   Alba Cory, MD    Physical Exam: Vitals:   09/16/22 0815 09/16/22 0816  BP: 105/81   Pulse: (!) 103   Resp: 20    Temp:  98.4 F (36.9 C)  TempSrc:  Oral  SpO2: 98%   Weight: 94.3 kg   Height: 6\' 3"  (1.905 m)    Physical Exam Constitutional:      Appearance: He is normal weight.  HENT:     Head: Normocephalic and atraumatic.     Nose: Nose normal.     Mouth/Throat:     Mouth: Mucous membranes are moist.  Eyes:     Pupils: Pupils are equal, round, and reactive to light.  Cardiovascular:     Rate and Rhythm: Normal rate and regular rhythm.  Pulmonary:     Effort: Pulmonary effort is normal.  Abdominal:     General: Bowel sounds are normal.  Musculoskeletal:        General: Normal range of motion.     Cervical back: Normal range of motion.  Skin:    General: Skin is warm.  Neurological:     General: No focal deficit present.  Psychiatric:        Mood and Affect: Mood normal.     Data Reviewed:   There are no new results to review at this time.    Family Communication: Family made aware of plan to transfer by ER team  Primary team communication: yes  Thank you very much for involving [place] in the care of your patient.  Author: Floydene Flock, MD 09/16/2022 11:49 AM  For on call review [email or web address].

## 2022-09-16 NOTE — Assessment & Plan Note (Signed)
Progressive SOB and DOE x 2-3 days with noted  bilateral pulmonary emboli in the left and right main pulmonary arteries and R heart strain  Started on heparin gtt  Vascular surgery consulted with recommendation for transfer in setting of PFO w/ clot present

## 2022-09-16 NOTE — ED Triage Notes (Signed)
Pt states has had sweating and shob since 0900, states on hx of pain. Pt diaphoretic in triage.

## 2022-09-16 NOTE — Assessment & Plan Note (Signed)
Trop 240s w/ noted bilateral PE w/ heart strain and PFO w/ clot burden  No active CP at present  Suspect demand ischemia in setting of bilateral PE w/ heart strain  Started on heparin gtt in ER  Pending transfer to tertiary facility for PFO w/ clot burden per vascular surgery recommendations

## 2022-09-19 ENCOUNTER — Telehealth: Payer: Self-pay | Admitting: Family Medicine

## 2022-09-19 NOTE — Telephone Encounter (Signed)
Copied from CRM [id number]. Topic: General - Other >> Sep 19, 2022  2:17 PM [place] B wrote: Reason for CRM: PT'S wife called in wanting to know if paperwork was received from [facility]. Please call back

## 2022-09-19 NOTE — Telephone Encounter (Signed)
I have not received any paperwork. Will check for new faxes tomorrow.

## 2022-09-20 NOTE — Telephone Encounter (Signed)
Called and lvm informing patient we have not received any paperwork from [facility] so far.

## 2022-10-05 NOTE — Progress Notes (Unsigned)
Name: Kristopher Davis   MRN: [id number]    DOB: 1955/06/09   Date:10/06/2022       Progress Note  Subjective  Chief Complaint  Follow up Post-surgical Heart catheterization [facility]  HPI  Patient developed SOB with exertion on 09/15/2022, went to Urgent care and collapsed when he arrived and was sent to [facility], he had a CTA that revealed acute bilateral pulmonary emboli along with evidence of acute right heart strain, and a possibility of clot in transit across a PFO, he was transferred to [facility] to have surgery however the clot was on right pulmonary artery and he had mechanical thrombectomy instead. He was sent to the floor on Eliquis. He has some confusion but negative CT brain and likely sundowning .   Doppler US: Left lower extremity noted acute obstruction in L popliteal vein, posterior tibial vein, peroneal vein, and tibioperoneal trunk vein   CT head was obtained 7/21 which was negative for any acute intracranial abnormality   He was supposed to stop aspirin 81 mg, he was advised to resume Losartan 100 mg monitor bp and if needed resume Norvasc.   He is taking aspirin ( advised him to stop taking it again)  He is not 100% sure if he is taking norvasc 10 mg. He came with his wife and they will send me a message to let me know what he has been taking and how.   Wife is not sure why the surgery was cancelled. 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 09/16/2022   Pulmonary embolism (HCC) 09/16/2022   B12 deficiency 07/13/2022   Adenomatous polyp of colon 02/08/2022    Cognitive dysfunction 08/19/2021   Vitamin B1 deficiency 08/19/2021   Folate deficiency 08/19/2021   Dysthymia 08/19/2021   Radiculitis of leg 08/19/2021   Erectile dysfunction 12/04/2019   Other hyperlipidemia 12/03/2019   Aortic atherosclerosis (HCC) 08/24/2017   Tinea 08/11/2014   Lipoma 10/28/2009   Disease of hair and hair follicles 07/28/2009   Benign essential HTN 01/07/2009   Decreased libido 02/01/2007   CD (contact dermatitis) 10/10/2006    Past Surgical History:  Procedure Laterality Date   COLONOSCOPY WITH PROPOFOL N/A 02/08/2022   Procedure: COLONOSCOPY WITH PROPOFOL;  Surgeon: Wyline Mood, MD;  Location: [facility] ENDOSCOPY;  Service: Gastroenterology;  Laterality: N/A;   LUMBAR LAMINECTOMY     3 surgeries   MECHANICAL THROMBECTOMY WITH AORTOGRAM AND INTERVENTION  08/2022   [facility]    Family History  Problem Relation Age of Onset   Cancer Mother    Hypertension Sister    Anemia Daughter     Social History   Tobacco Use   Smoking status: Some Days    Current packs/day: 0.25    Average packs/day: 0.2 packs/day for 36.9 years (9.2 ttl pk-yrs)    Types: Cigarettes    Start date: 11/07/1985   Smokeless tobacco: Former    Quit date: 08/21/2017  Substance Use Topics   Alcohol use: Yes    Alcohol/week: 2.0 standard drinks of alcohol    Types: 2 Cans of beer  per week     Current Outpatient Medications:    apixaban (ELIQUIS) 5 MG TABS tablet, Take by mouth., Disp: , Rfl:    cyanocobalamin (VITAMIN B12) 1000 MCG tablet, Take 1,000 mcg by mouth daily., Disp: , Rfl:    folic acid (FOLVITE) 1 MG tablet, Take 1 tablet (1 mg total) by mouth daily., Disp: 90 tablet, Rfl: 1   losartan (COZAAR) 100 MG tablet, Take 1 tablet (100 mg total) by mouth daily., Disp: 90 tablet, Rfl: 1   tadalafil (CIALIS) 20 MG tablet, Take 1 tablet (20 mg total) by mouth every other day as needed for erectile dysfunction. Take 1 tab 1 hour prior to intercourse, Disp: 6 tablet, Rfl: 1   amLODipine  (NORVASC) 10 MG tablet, TAKE 1 TABLET(10 MG) BY MOUTH DAILY (Patient not taking: Reported on 09/16/2022), Disp: 90 tablet, Rfl: 0  Allergies  Allergen Reactions   Levofloxacin In D5w     hallucinations    I personally reviewed active problem list, medication list, allergies, family history with the patient/caregiver today.   ROS  Constitutional: Negative for fever or weight change.  Respiratory: Negative for cough and shortness of breath.   Cardiovascular: Negative for chest pain or palpitations.  Gastrointestinal: Negative for abdominal pain, no bowel changes.  Musculoskeletal: Negative for gait problem or joint swelling.  Skin: Negative for rash.  Neurological: Negative for dizziness or headache.  No other specific complaints in a complete review of systems (except as listed in HPI above).   Objective  Vitals:   10/06/22 0850  BP: 112/72  Pulse: 89  Resp: 16  Temp: 98.1 F (36.7 C)  TempSrc: Oral  SpO2: 100%  Weight: 209 lb 14.4 oz (95.2 kg)  Height: 6\' 4"  (1.93 m)    Body mass index is 25.55 kg/m.  Physical Exam  Constitutional: Patient appears well-developed and well-nourished. No distress.  HEENT: head atraumatic, normocephalic, pupils equal and reactive to light, neck supple Cardiovascular: Normal rate, regular rhythm and normal heart sounds.  No murmur heard. No BLE edema. Pulmonary/Chest: Effort normal and breath sounds normal. No respiratory distress. Abdominal: Soft.  There is no tenderness. Psychiatric: Patient has a normal mood and affect. behavior is normal. Judgment and thought content normal.   Recent Results (from the past 2160 hour(s))  Lipid panel     Status: None   Collection Time: 08/23/22  9:14 AM  Result Value Ref Range   Cholesterol 177 <200 mg/dL   HDL 93 > OR = 40 mg/dL   Triglycerides 48 <409 mg/dL   LDL Cholesterol (Calc) 70 mg/dL (calc)    Comment: Reference range: <100 . Desirable range <100 mg/dL for primary prevention;   <70  mg/dL for patients with CHD or diabetic patients  with > or = 2 CHD risk factors. Marland Kitchen LDL-C is now calculated using the Martin-Hopkins  calculation, which is a validated novel method providing  better accuracy than the Friedewald equation in the  estimation of LDL-C.  Horald Pollen et al. Lenox Ahr. 8119;147(82): 2061-2068  (http://education.[employer].com/faq/FAQ164)    Total CHOL/HDL Ratio 1.9 <5.0 (calc)   Non-HDL Cholesterol (Calc) 84 <956 mg/dL (calc)    Comment: For patients with diabetes plus 1 major ASCVD risk  factor, treating to a non-HDL-C goal of <100 mg/dL  (LDL-C of <21 mg/dL) is considered a therapeutic  option.   CBC with Differential/Platelet     Status: None   Collection Time: 08/23/22  9:14 AM  Result Value Ref Range   WBC 3.9  3.8 - 10.8 Thousand/uL   RBC 4.52 4.20 - 5.80 Million/uL   Hemoglobin 14.8 13.2 - 17.1 g/dL   HCT 16.1 09.6 - 04.5 %   MCV 95.6 80.0 - 100.0 fL   MCH 32.7 27.0 - 33.0 pg   MCHC 34.3 32.0 - 36.0 g/dL   RDW 40.9 81.1 - 91.4 %   Platelets 237 140 - 400 Thousand/uL   MPV 9.7 7.5 - 12.5 fL   Neutro Abs 2,246 1,500 - 7,800 cells/uL   Lymphs Abs 1,252 850 - 3,900 cells/uL   Absolute Monocytes 332 200 - 950 cells/uL   Eosinophils Absolute 39 15 - 500 cells/uL   Basophils Absolute 31 0 - 200 cells/uL   Neutrophils Relative % 57.6 %   Total Lymphocyte 32.1 %   Monocytes Relative 8.5 %   Eosinophils Relative 1.0 %   Basophils Relative 0.8 %  COMPLETE METABOLIC PANEL WITH GFR     Status: None   Collection Time: 08/23/22  9:14 AM  Result Value Ref Range   Glucose, Bld 91 65 - 99 mg/dL    Comment: .            Fasting reference interval .    BUN 15 7 - 25 mg/dL   Creat 7.82 9.56 - 2.13 mg/dL   eGFR 65 > OR = 60 YQ/MVH/8.46N6   BUN/Creatinine Ratio SEE NOTE: 6 - 22 (calc)    Comment:    Not Reported: BUN and Creatinine are within    reference range. .    Sodium 141 135 - 146 mmol/L   Potassium 4.5 3.5 - 5.3 mmol/L   Chloride 107 98 - 110  mmol/L   CO2 26 20 - 32 mmol/L   Calcium 9.4 8.6 - 10.3 mg/dL   Total Protein 6.9 6.1 - 8.1 g/dL   Albumin 4.1 3.6 - 5.1 g/dL   Globulin 2.8 1.9 - 3.7 g/dL (calc)   AG Ratio 1.5 1.0 - 2.5 (calc)   Total Bilirubin 0.8 0.2 - 1.2 mg/dL   Alkaline phosphatase (APISO) 42 35 - 144 U/L   AST 22 10 - 35 U/L   ALT 14 9 - 46 U/L  B12 and Folate Panel     Status: None   Collection Time: 08/23/22  9:14 AM  Result Value Ref Range   Vitamin B-12 361 200 - 1,100 pg/mL    Comment: . Please Note: Although the reference range for vitamin B12 is [phone] pg/mL, it has been reported that between 5 and 10% of patients with values between 200 and 400 pg/mL may experience neuropsychiatric and hematologic abnormalities due to occult B12 deficiency; less than 1% of patients with values above 400 pg/mL will have symptoms. .    Folate >24.0 ng/mL    Comment:                            Reference Range                            Low:           <3.4                            Borderline:    3.4-5.4  Normal:        >5.4 .   Vitamin B1     Status: Abnormal   Collection Time: 08/23/22  9:14 AM  Result Value Ref Range   Vitamin B1 (Thiamine) <6 (L) 8 - 30 nmol/L    Comment: (Note) Vitamin supplementation within 24 hours prior to blood  draw may affect the accuracy of the results. . This test was developed and its analytical performance  characteristics have been determined by [employer]. It has not been cleared or approved by FDA.  This assay has been validated pursuant to the CLIA  regulations and is used for clinical purposes. . [facility] [address] [place] [address] [phone] Junita Push L. Thompson Caul, MD, PhD   CBC     Status: None   Collection Time: 09/16/22  8:18 AM  Result Value Ref Range   WBC 7.9 4.0 - 10.5 K/uL   RBC 4.78 4.22 - 5.81 MIL/uL   Hemoglobin 15.3 13.0 - 17.0 g/dL   HCT 96.2 95.2 - 84.1 %   MCV 95.6  80.0 - 100.0 fL   MCH 32.0 26.0 - 34.0 pg   MCHC 33.5 30.0 - 36.0 g/dL   RDW 32.4 40.1 - 02.7 %   Platelets 197 150 - 400 K/uL   nRBC 0.0 0.0 - 0.2 %    Comment: Performed at [facility], [address] [place], [place] [address]  Comprehensive metabolic panel     Status: Abnormal   Collection Time: 09/16/22  8:18 AM  Result Value Ref Range   Sodium 138 135 - 145 mmol/L   Potassium 4.1 3.5 - 5.1 mmol/L   Chloride 105 98 - 111 mmol/L   CO2 19 (L) 22 - 32 mmol/L   Glucose, Bld 188 (H) 70 - 99 mg/dL    Comment: Glucose reference range applies only to samples taken after fasting for at least 8 hours.   BUN 10 8 - 23 mg/dL   Creatinine, Ser 4.40 (H) 0.61 - 1.24 mg/dL   Calcium 8.9 8.9 - 34.7 mg/dL   Total Protein 7.1 6.5 - 8.1 g/dL   Albumin 3.7 3.5 - 5.0 g/dL   AST 22 15 - 41 U/L   ALT 15 0 - 44 U/L   Alkaline Phosphatase 52 38 - 126 U/L   Total Bilirubin 1.1 0.3 - 1.2 mg/dL   GFR, Estimated 48 (L) >60 mL/min    Comment: (NOTE) Calculated using the CKD-EPI Creatinine Equation (2021)    Anion gap 14 5 - 15    Comment: Performed at [facility], [address] [place], [place] [address]  Troponin I (High Sensitivity)     Status: Abnormal   Collection Time: 09/16/22  8:18 AM  Result Value Ref Range   Troponin I (High Sensitivity) 241 (HH) <18 ng/L    Comment: CRITICAL RESULT CALLED TO, READ BACK BY AND VERIFIED WITH: REINA GALJOUR AT 1000 09/16/22.PMF (NOTE) Elevated high sensitivity troponin I (hsTnI) values and significant  changes across serial measurements may suggest ACS but many other  chronic and acute conditions are known to elevate hsTnI results.  Refer to the Links section for chest pain algorithms and additional  guidance. Performed at [facility], [address] [place], [place] [address]   D-dimer, quantitative     Status: Abnormal   Collection Time: 09/16/22  8:35 AM  Result Value Ref Range   D-Dimer, Quant 15.99 (H) 0.00 -  0.50 ug/mL-FEU    Comment: (NOTE) At the manufacturer cut-off value of 0.5 g/mL FEU, this assay has a negative predictive value of 95-100%.This assay is intended for use in conjunction with a clinical pretest probability (PTP) assessment model to exclude pulmonary embolism (PE) and deep venous thrombosis (DVT) in outpatients suspected of PE or DVT. Results should be correlated with clinical presentation. Performed at [facility], [address] [place], [place] [address]   SARS Coronavirus 2 by RT PCR (hospital order, performed in [facility] hospital lab) *cepheid single result test* Anterior Nasal Swab     Status: None   Collection Time: 09/16/22  8:54 AM   Specimen: Anterior Nasal Swab  Result Value Ref Range   SARS Coronavirus 2 by RT PCR NEGATIVE NEGATIVE    Comment: (NOTE) SARS-CoV-2 target nucleic acids are NOT DETECTED.  The SARS-CoV-2 RNA is generally detectable in upper and lower respiratory specimens during the acute phase of infection. The lowest concentration of SARS-CoV-2 viral copies this assay can detect is 250 copies / mL. A negative result does not preclude SARS-CoV-2 infection and should not be used as the sole basis for treatment or other patient management decisions.  A negative result may occur with improper specimen collection / handling, submission of specimen other than nasopharyngeal swab, presence of viral mutation(s) within the areas targeted by this assay, and inadequate number of viral copies (<250 copies / mL). A negative result must be combined with clinical observations, patient history, and epidemiological information.  Fact Sheet for Patients:   RoadLapTop.co.za  Fact Sheet for Healthcare Providers: http://kim-miller.com/  This test is not yet approved or  cleared by the [place] FDA and has been authorized for detection and/or diagnosis of SARS-CoV-2 by FDA under an Emergency Use  Authorization (EUA).  This EUA will remain in effect (meaning this test can be used) for the duration of the COVID-19 declaration under Section 564(b)(1) of the Act, 21 U.S.C. section 360bbb-3(b)(1), unless the authorization is terminated or revoked sooner.  Performed at [facility], [address] [place], [place] [address]   Troponin I (High Sensitivity)     Status: Abnormal   Collection Time: 09/16/22 10:18 AM  Result Value Ref Range   Troponin I (High Sensitivity) 187 (HH) <18 ng/L    Comment: CRITICAL VALUE NOTED. VALUE IS CONSISTENT WITH PREVIOUSLY REPORTED/CALLED VALUE.PMF (NOTE) Elevated high sensitivity troponin I (hsTnI) values and significant  changes across serial measurements may suggest ACS but many other  chronic and acute conditions are known to elevate hsTnI results.  Refer to the "Links" section for chest pain algorithms and additional  guidance. Performed at [facility], [address] [place], [place] [address]   APTT     Status: Abnormal   Collection Time: 09/16/22 11:00 AM  Result Value Ref Range   aPTT >200 (HH) 24 - 36 seconds    Comment:        IF BASELINE aPTT IS ELEVATED, SUGGEST PATIENT RISK ASSESSMENT BE USED TO DETERMINE APPROPRIATE ANTICOAGULANT THERAPY. REPEATED TO VERIFY CRITICAL RESULT CALLED TO, READ BACK BY AND VERIFIED WITH: ANGELA ROBINS AT 09/16/22 1425 AB Performed at [facility], [address] [place], [place] [address]   Heparin level (unfractionated)     Status: Abnormal   Collection Time: 09/16/22 12:10 PM  Result Value Ref Range   Heparin Unfractionated >1.10 (H) 0.30 - 0.70 IU/mL    Comment: (NOTE) The clinical reportable range upper limit is being lowered  to >1.10 to align with the FDA approved guidance for the current laboratory assay.  If heparin results are below expected values, and patient dosage has  been confirmed, suggest follow up testing of antithrombin III levels. Performed  at [facility], [address] [place], [place] [address]     PHQ2/9:    10/06/2022    8:52 AM 08/23/2022    8:16 AM 07/13/2022   11:19 AM 03/30/2022   10:34 AM 02/22/2022    7:56 AM  Depression screen PHQ 2/9  Decreased Interest 0 0 0 0 0  Down, Depressed, Hopeless 0 0 0 0 0  PHQ - 2 Score 0 0 0 0 0  Altered sleeping 0 0 0 0 0  Tired, decreased energy 0 0 0 0 0  Change in appetite 0 0 0 0 0  Feeling bad or failure about yourself  0 0 0 0 0  Trouble concentrating 0 0 0 0 0  Moving slowly or fidgety/restless 0 0 0 0 0  Suicidal thoughts 0 0 0 0 0  PHQ-9 Score 0 0 0 0 0  Difficult doing work/chores    Not difficult at all     phq 9 is negative   Fall Risk:    10/06/2022    8:52 AM 08/23/2022    8:16 AM 07/13/2022   11:19 AM 03/30/2022   10:33 AM 02/22/2022    7:56 AM  Fall Risk   Falls in the past year? 0 0 0 0 0  Number falls in past yr:  0 0 0 0  Injury with Fall?  0 0 0 0  Risk for fall due to : No Fall Risks No Fall Risks No Fall Risks No Fall Risks No Fall Risks  Follow up Falls prevention discussed Falls prevention discussed Falls prevention discussed Falls prevention discussed;Education provided;Falls evaluation completed Falls prevention discussed    Functional Status Survey: Is the patient deaf or have difficulty hearing?: Yes Does the patient have difficulty seeing, even when wearing glasses/contacts?: No Does the patient have difficulty concentrating, remembering, or making decisions?: No Does the patient have difficulty walking or climbing stairs?: No Does the patient have difficulty dressing or bathing?: No Does the patient have difficulty doing errands alone such as visiting a doctor's office or shopping?: No    Assessment & Plan  1. History of pulmonary embolism  - Ambulatory referral to Hematology / Oncology  At this point not sure if he has  PFO. Placing referral to find out if   2. Acute deep vein thrombosis (DVT) of proximal vein of  left lower extremity (HCC)  - Ambulatory referral to Hematology / Oncology  3. Benign essential HTN  He does not think he is taking norvasc   4. B12 deficiency  Continue supplementation   5. Vitamin B1 deficiency  Continue supplementation

## 2022-10-06 ENCOUNTER — Ambulatory Visit (INDEPENDENT_AMBULATORY_CARE_PROVIDER_SITE_OTHER): Payer: Medicare Other | Admitting: Family Medicine

## 2022-10-06 ENCOUNTER — Encounter: Payer: Self-pay | Admitting: Family Medicine

## 2022-10-06 VITALS — BP 112/72 | HR 89 | Temp 98.1°F | Resp 16 | Ht 76.0 in | Wt 209.9 lb

## 2022-10-06 DIAGNOSIS — E519 Thiamine deficiency, unspecified: Secondary | ICD-10-CM | POA: Diagnosis not present

## 2022-10-06 DIAGNOSIS — E538 Deficiency of other specified B group vitamins: Secondary | ICD-10-CM

## 2022-10-06 DIAGNOSIS — Z86711 Personal history of pulmonary embolism: Secondary | ICD-10-CM | POA: Insufficient documentation

## 2022-10-06 DIAGNOSIS — I10 Essential (primary) hypertension: Secondary | ICD-10-CM | POA: Diagnosis not present

## 2022-10-06 DIAGNOSIS — I824Y2 Acute embolism and thrombosis of unspecified deep veins of left proximal lower extremity: Secondary | ICD-10-CM | POA: Insufficient documentation

## 2022-10-20 DIAGNOSIS — I071 Rheumatic tricuspid insufficiency: Secondary | ICD-10-CM | POA: Diagnosis not present

## 2022-10-20 DIAGNOSIS — Z86711 Personal history of pulmonary embolism: Secondary | ICD-10-CM | POA: Diagnosis not present

## 2022-10-20 DIAGNOSIS — I77810 Thoracic aortic ectasia: Secondary | ICD-10-CM | POA: Diagnosis not present

## 2022-10-20 DIAGNOSIS — R0602 Shortness of breath: Secondary | ICD-10-CM | POA: Diagnosis not present

## 2022-11-18 ENCOUNTER — Other Ambulatory Visit: Payer: Self-pay | Admitting: Family Medicine

## 2022-11-18 DIAGNOSIS — I10 Essential (primary) hypertension: Secondary | ICD-10-CM

## 2022-11-21 MED ORDER — AMLODIPINE BESYLATE 10 MG PO TABS
10.0000 mg | ORAL_TABLET | Freq: Every day | ORAL | 0 refills | Status: DC
Start: 2022-11-21 — End: 2023-03-09

## 2023-01-27 ENCOUNTER — Other Ambulatory Visit: Payer: Self-pay | Admitting: Family Medicine

## 2023-01-27 DIAGNOSIS — E538 Deficiency of other specified B group vitamins: Secondary | ICD-10-CM

## 2023-01-31 ENCOUNTER — Other Ambulatory Visit: Payer: Self-pay

## 2023-01-31 DIAGNOSIS — E538 Deficiency of other specified B group vitamins: Secondary | ICD-10-CM

## 2023-01-31 MED ORDER — FOLIC ACID 1 MG PO TABS
1.0000 mg | ORAL_TABLET | Freq: Every day | ORAL | 1 refills | Status: DC
Start: 2023-01-31 — End: 2023-05-17

## 2023-02-01 ENCOUNTER — Ambulatory Visit: Payer: Medicare HMO | Admitting: Nurse Practitioner

## 2023-02-02 ENCOUNTER — Ambulatory Visit: Payer: Medicare HMO | Admitting: Physician Assistant

## 2023-02-07 ENCOUNTER — Ambulatory Visit (INDEPENDENT_AMBULATORY_CARE_PROVIDER_SITE_OTHER): Payer: Medicare HMO | Admitting: Physician Assistant

## 2023-02-07 ENCOUNTER — Encounter: Payer: Self-pay | Admitting: Physician Assistant

## 2023-02-07 VITALS — BP 132/86 | HR 69 | Resp 16 | Ht 76.0 in | Wt 213.0 lb

## 2023-02-07 DIAGNOSIS — J9801 Acute bronchospasm: Secondary | ICD-10-CM | POA: Diagnosis not present

## 2023-02-07 MED ORDER — BENZONATATE 100 MG PO CAPS
100.0000 mg | ORAL_CAPSULE | Freq: Two times a day (BID) | ORAL | 0 refills | Status: DC | PRN
Start: 2023-02-07 — End: 2023-03-09

## 2023-02-07 NOTE — Patient Instructions (Signed)
I also recommend adding an antihistamine to your daily regimen This includes medications like Claritin, Allegra, Zyrtec- the generics of these work very well and are usually less expensive I recommend using Flonase nasal spray - 2 puffs twice per day to help with your nasal congestion The antihistamines and Flonase can take a few weeks to provide significant relief from allergy symptoms but should start to provide some benefit soon.  I have sent in a script for Tessalon pearls to help with the coughing You can use Robitussin and Mucinex

## 2023-02-07 NOTE — Progress Notes (Signed)
Acute Office Visit   Patient: Kristopher Davis   DOB: 1955/07/03   67 y.o. Male  MRN: [id number] Visit Date: 02/07/2023  Today's healthcare provider: Oswaldo Conroy Tymir Terral, PA-C  Introduced myself to the patient as a Secondary school teacher and provided education on APPs in clinical practice.    Chief Complaint  Patient presents with   Cold Like Symptoms    x1 week. Household has been spreading a "cold". Runny nose/ productive cough.   Subjective    HPI HPI     Cold Like Symptoms    Additional comments: x1 week. Household has been spreading a "cold". Runny nose/ productive cough.      Last edited by Dollene Primrose, CMA on 02/07/2023 12:59 PM.       URI -type symptoms  Onset: sudden  Duration: ongoing for about a week Associated symptoms: congestion, productive cough,  Reports other symptoms have been improving but cough is lingering  States coughing is usually worse at night but denies other symptoms at this time   Intervention: Robitussin DM   Recent sick contacts: whole household has similar symptoms  Recent travel: none COVID testing at home: has not tested for COVID at home   Result: NA     Medications: Outpatient Medications Prior to Visit  Medication Sig   amLODipine (NORVASC) 10 MG tablet Take 1 tablet (10 mg total) by mouth daily.   cyanocobalamin (VITAMIN B12) 1000 MCG tablet Take 1,000 mcg by mouth daily.   folic acid (FOLVITE) 1 MG tablet Take 1 tablet (1 mg total) by mouth daily.   losartan (COZAAR) 100 MG tablet Take 1 tablet (100 mg total) by mouth daily.   tadalafil (CIALIS) 20 MG tablet Take 1 tablet (20 mg total) by mouth every other day as needed for erectile dysfunction. Take 1 tab 1 hour prior to intercourse   No facility-administered medications prior to visit.    Review of Systems  Constitutional:  Negative for chills, fatigue and fever.  HENT:  Positive for rhinorrhea. Negative for congestion and postnasal drip.   Respiratory:  Positive for cough.  Negative for shortness of breath and wheezing.   Gastrointestinal:  Negative for diarrhea, nausea and vomiting.  Musculoskeletal:  Negative for myalgias.        Objective    BP 132/86   Pulse 69   Resp 16   Ht 6\' 4"  (1.93 m)   Wt 213 lb (96.6 kg)   SpO2 100%   BMI 25.93 kg/m     Physical Exam Vitals reviewed.  Constitutional:      General: He is awake.     Appearance: Normal appearance. He is well-developed and well-groomed.  HENT:     Head: Normocephalic and atraumatic.     Right Ear: Hearing, tympanic membrane and ear canal normal.     Left Ear: Hearing, tympanic membrane and ear canal normal.     Mouth/Throat:     Lips: Pink.     Pharynx: Uvula midline. No pharyngeal swelling, oropharyngeal exudate, posterior oropharyngeal erythema, uvula swelling or postnasal drip.  Cardiovascular:     Rate and Rhythm: Normal rate and regular rhythm.  Pulmonary:     Effort: Pulmonary effort is normal.     Breath sounds: Normal breath sounds. No decreased air movement. No decreased breath sounds, wheezing, rhonchi or rales.  Musculoskeletal:     Cervical back: Normal range of motion and neck supple.  Lymphadenopathy:     Head:  Right side of head: No submental, submandibular or preauricular adenopathy.     Left side of head: No submental, submandibular or preauricular adenopathy.     Cervical:     Right cervical: No superficial cervical adenopathy.    Left cervical: No superficial cervical adenopathy.     Upper Body:     Right upper body: No supraclavicular adenopathy.     Left upper body: No supraclavicular adenopathy.  Neurological:     Mental Status: He is alert.  Psychiatric:        Behavior: Behavior is cooperative.       No results found for any visits on 02/07/23.  Assessment & Plan      No follow-ups on file.     Problem List Items Addressed This Visit   None Visit Diagnoses     Cough due to bronchospasm    -  Primary   Relevant Medications    benzonatate (TESSALON) 100 MG capsule      Acute, resolving  Patient reports lingering cough following URI that is likely viral in nature Will send in script for tessalon pearls to assist with coughing Physical exam is largely normal today so less suspicious of continued infectious process Can continue to use OTC cough medication as desired for symptomatic relief Reviewed return precautions Follow up as needed for persistent or progressing symptoms     No follow-ups on file.   I, Uldine Fuster E Jada Kuhnert, PA-C, have reviewed all documentation for this visit. The documentation on 02/07/23 for the exam, diagnosis, procedures, and orders are all accurate and complete.   Jacquelin Hawking, MHS, PA-C [facility]

## 2023-02-25 ENCOUNTER — Other Ambulatory Visit: Payer: Self-pay | Admitting: Family Medicine

## 2023-02-25 DIAGNOSIS — I10 Essential (primary) hypertension: Secondary | ICD-10-CM

## 2023-02-27 NOTE — Telephone Encounter (Signed)
Lvm informing pt that his prescription has been sent to the pharmacy. However per Dr Carlynn Purl pt need to be seen before March for additional refills.

## 2023-03-09 ENCOUNTER — Ambulatory Visit (INDEPENDENT_AMBULATORY_CARE_PROVIDER_SITE_OTHER): Payer: Medicare HMO | Admitting: Family Medicine

## 2023-03-09 ENCOUNTER — Encounter: Payer: Self-pay | Admitting: Family Medicine

## 2023-03-09 VITALS — BP 144/96 | HR 70 | Resp 16 | Ht 76.0 in | Wt 219.2 lb

## 2023-03-09 DIAGNOSIS — Z86711 Personal history of pulmonary embolism: Secondary | ICD-10-CM | POA: Diagnosis not present

## 2023-03-09 DIAGNOSIS — N529 Male erectile dysfunction, unspecified: Secondary | ICD-10-CM | POA: Diagnosis not present

## 2023-03-09 DIAGNOSIS — I700 Atherosclerosis of aorta: Secondary | ICD-10-CM | POA: Diagnosis not present

## 2023-03-09 DIAGNOSIS — E519 Thiamine deficiency, unspecified: Secondary | ICD-10-CM | POA: Diagnosis not present

## 2023-03-09 DIAGNOSIS — I10 Essential (primary) hypertension: Secondary | ICD-10-CM

## 2023-03-09 DIAGNOSIS — E538 Deficiency of other specified B group vitamins: Secondary | ICD-10-CM

## 2023-03-09 MED ORDER — TADALAFIL 20 MG PO TABS: 20.0000 mg | ORAL_TABLET | ORAL | 2 refills | Status: DC | PRN

## 2023-03-09 MED ORDER — VITAMIN B-1 50 MG PO TABS: 50.0000 mg | ORAL_TABLET | Freq: Every day | ORAL | 1 refills | Status: DC

## 2023-03-09 MED ORDER — AMLODIPINE BESYLATE 2.5 MG PO TABS: 2.5000 mg | ORAL_TABLET | Freq: Every day | ORAL | 0 refills | Status: DC

## 2023-03-09 NOTE — Progress Notes (Signed)
 Name: Kristopher Davis   MRN: [id number]    DOB: 08-15-1955   Date:03/09/2023       Progress Note  Subjective  Chief Complaint  Chief Complaint  Patient presents with   Medical Management of Chronic Issues    HTN- pt has not been taking amlodipine  but has been taking atenolol  which has been d/c since 2022    HPI Discussed the use of AI scribe software for clinical note transcription with the patient, who gave verbal consent to proceed.  History of Present Illness      Patient developed SOB with exertion on 09/15/2022, went to Urgent care and collapsed when he arrived and was sent to [facility], he had a CTA that revealed acute bilateral pulmonary emboli along with evidence of acute right heart strain, and a possibility of clot in transit across a PFO, he was transferred to [facility] to have surgery however the clot was on right pulmonary artery and he had mechanical thrombectomy instead. He is not currently taking Eliquis  states he thought he was supposed to stop even though cardiologist note states to follow up with hematologist before stopping medication. I will place referral to hematologist today   HTN: he was on Norvasc  10 mg and losartan  100 mg, per cardiologist note advised to hold norvasc  for one week due to lower extremity edema. He does not recall that but brought a bottle of atenolol  25 mg and losartan  100 mg with him. 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 09/16/2022   Pulmonary embolism  (HCC) 09/16/2022   B12 deficiency 07/13/2022   Adenomatous polyp of colon 02/08/2022   Cognitive dysfunction 08/19/2021   Vitamin B1 deficiency 08/19/2021   Folate deficiency 08/19/2021   Dysthymia 08/19/2021   Radiculitis of leg 08/19/2021   Erectile dysfunction 12/04/2019   Other hyperlipidemia 12/03/2019   Aortic atherosclerosis (HCC) 08/24/2017   Tinea 08/11/2014   Lipoma 10/28/2009   Disease of hair and hair follicles 07/28/2009   Benign essential HTN 01/07/2009   Decreased libido 02/01/2007   CD (contact dermatitis) 10/10/2006    Past Surgical History:  Procedure Laterality Date   COLONOSCOPY WITH PROPOFOL  N/A 02/08/2022   Procedure: COLONOSCOPY WITH PROPOFOL ;  Surgeon: Therisa Bi, MD;  Location: [facility] ENDOSCOPY;  Service: Gastroenterology;  Laterality: N/A;   LUMBAR LAMINECTOMY     3 surgeries   MECHANICAL THROMBECTOMY WITH AORTOGRAM AND INTERVENTION  08/2022   [facility]    Family History  Problem Relation Age of Onset   Cancer Mother    Hypertension Sister    Anemia Daughter     Social History   Tobacco Use   Smoking status: Some Days    Current packs/day: 0.25    Average packs/day: 0.2 packs/day for 37.3 years (9.3 ttl pk-yrs)    Types: Cigarettes    Start date: 11/07/1985   Smokeless tobacco: Former    Quit date: 08/21/2017  Substance Use Topics   Alcohol use: Yes    Alcohol/week: 2.0 standard drinks of alcohol    Types: 2 Cans of  beer per week     Current Outpatient Medications:    losartan  (COZAAR ) 100 MG tablet, TAKE 1 TABLET(100 MG) BY MOUTH DAILY, Disp: 90 tablet, Rfl: 0   amLODipine  (NORVASC ) 10 MG tablet, Take 1 tablet (10 mg total) by mouth daily. (Patient not taking: Reported on 03/09/2023), Disp: 90 tablet, Rfl: 0   benzonatate  (TESSALON ) 100 MG capsule, Take 1 capsule (100 mg total) by mouth 2 (two) times daily as needed for cough., Disp: 20 capsule, Rfl: 0   cyanocobalamin  (VITAMIN B12) 1000 MCG tablet, Take 1,000 mcg by mouth daily. (Patient not  taking: Reported on 03/09/2023), Disp: , Rfl:    folic acid  (FOLVITE ) 1 MG tablet, Take 1 tablet (1 mg total) by mouth daily. (Patient not taking: Reported on 03/09/2023), Disp: 90 tablet, Rfl: 1   tadalafil  (CIALIS ) 20 MG tablet, Take 1 tablet (20 mg total) by mouth every other day as needed for erectile dysfunction. Take 1 tab 1 hour prior to intercourse (Patient not taking: Reported on 03/09/2023), Disp: 6 tablet, Rfl: 1  Allergies  Allergen Reactions   Levofloxacin  In D5w     hallucinations    I personally reviewed active problem list, medication list, allergies, family history with the patient/caregiver today.   ROS  Ten systems reviewed and is negative except as mentioned in HPI    Objective  Vitals:   03/09/23 1316  BP: (!) 144/96  Pulse: 70  Resp: 16  SpO2: 93%  Weight: 219 lb 3.2 oz (99.4 kg)  Height: 6' 4 (1.93 m)    Body mass index is 26.68 kg/m.  Physical Exam  Constitutional: Patient appears well-developed and well-nourished.  No distress.  HEENT: head atraumatic, normocephalic, pupils equal and reactive to light, neck supple Cardiovascular: Normal rate, regular rhythm and normal heart sounds.  No murmur heard. No BLE edema. Pulmonary/Chest: Effort normal and breath sounds normal. No respiratory distress. Abdominal: Soft.  There is no tenderness. Psychiatric: Patient has a normal mood and affect. behavior is normal. Judgment and thought content normal.    Diabetic Foot Exam:     PHQ2/9:    03/09/2023    1:08 PM 02/07/2023   12:49 PM 10/06/2022    8:52 AM 08/23/2022    8:16 AM 07/13/2022   11:19 AM  Depression screen PHQ 2/9  Decreased Interest 0 0 0 0 0  Down, Depressed, Hopeless 0 0 0 0 0  PHQ - 2 Score 0 0 0 0 0  Altered sleeping 0 0 0 0 0  Tired, decreased energy 0 0 0 0 0  Change in appetite 0 0 0 0 0  Feeling bad or failure about yourself  0 0 0 0 0  Trouble concentrating 0 0 0 0 0  Moving slowly or fidgety/restless 0 0 0 0 0  Suicidal thoughts 0  0 0 0 0  PHQ-9 Score 0 0 0 0 0  Difficult doing work/chores Not difficult at all        phq 9 is negative  Fall Risk:    03/09/2023    1:08 PM 02/07/2023   12:49 PM 10/06/2022    8:52 AM 08/23/2022    8:16 AM 07/13/2022   11:19 AM  Fall Risk   Falls in the past year? 0 0 0 0 0  Number falls in past yr: 0 0  0 0  Injury with Fall? 0 0  0 0  Risk for fall due to : No Fall Risks No Fall Risks No Fall  Risks No Fall Risks No Fall Risks  Follow up Falls prevention discussed;Education provided;Falls evaluation completed Falls prevention discussed Falls prevention discussed Falls prevention discussed Falls prevention discussed    Assessment & Plan

## 2023-03-09 NOTE — Patient Instructions (Signed)
 Stop Atenolol  Continue losartan  100 mg daily Resume amlodipine  but at a lower dose of 2.5 mg daily  Resume Eliquis  5 mg twice daily until seen by hematologist, that was recommend by [facility] cardiologist  Resume taking B12, folic acid  and B1 supplements Return in 3 months for bp check and follow up

## 2023-03-14 ENCOUNTER — Encounter: Payer: Self-pay | Admitting: Internal Medicine

## 2023-03-14 ENCOUNTER — Inpatient Hospital Stay: Payer: Medicare HMO | Attending: Internal Medicine | Admitting: Internal Medicine

## 2023-03-14 ENCOUNTER — Inpatient Hospital Stay: Payer: Medicare HMO

## 2023-03-14 VITALS — BP 148/90 | HR 87 | Temp 97.0°F | Resp 18 | Wt 215.0 lb

## 2023-03-14 DIAGNOSIS — Z7901 Long term (current) use of anticoagulants: Secondary | ICD-10-CM

## 2023-03-14 DIAGNOSIS — Z86711 Personal history of pulmonary embolism: Secondary | ICD-10-CM

## 2023-03-14 LAB — D-DIMER, QUANTITATIVE: D-Dimer, Quant: <0.27 ug{FEU}/mL (ref 0.00–0.50)

## 2023-03-14 LAB — CBC WITH DIFFERENTIAL/PLATELET
Abs Immature Granulocytes: 0.02 10*3/uL (ref 0.00–0.07)
Basophils Absolute: 0 10*3/uL (ref 0.0–0.1)
Basophils Relative: 0 %
Eosinophils Absolute: 0 10*3/uL (ref 0.0–0.5)
Eosinophils Relative: 1 %
HCT: 41.2 % (ref 39.0–52.0)
Hemoglobin: 13.6 g/dL (ref 13.0–17.0)
Immature Granulocytes: 0 %
Lymphocytes Relative: 25 %
Lymphs Abs: 1.4 10*3/uL (ref 0.7–4.0)
MCH: 29.6 pg (ref 26.0–34.0)
MCHC: 33 g/dL (ref 30.0–36.0)
MCV: 89.8 fL (ref 80.0–100.0)
Monocytes Absolute: 0.4 10*3/uL (ref 0.1–1.0)
Monocytes Relative: 8 %
Neutro Abs: 3.7 10*3/uL (ref 1.7–7.7)
Neutrophils Relative %: 66 %
Platelets: 241 10*3/uL (ref 150–400)
RBC: 4.59 MIL/uL (ref 4.22–5.81)
RDW: 15.5 % (ref 11.5–15.5)
WBC: 5.5 10*3/uL (ref 4.0–10.5)
nRBC: 0 % (ref 0.0–0.2)

## 2023-03-14 LAB — COMPREHENSIVE METABOLIC PANEL
ALT: 13 U/L (ref 0–44)
AST: 19 U/L (ref 15–41)
Albumin: 3.9 g/dL (ref 3.5–5.0)
Alkaline Phosphatase: 44 U/L (ref 38–126)
Anion gap: 8 (ref 5–15)
BUN: 10 mg/dL (ref 8–23)
CO2: 24 mmol/L (ref 22–32)
Calcium: 8.9 mg/dL (ref 8.9–10.3)
Chloride: 106 mmol/L (ref 98–111)
Creatinine, Ser: 1.21 mg/dL (ref 0.61–1.24)
GFR, Estimated: >60 mL/min (ref >60)
Glucose, Bld: 95 mg/dL (ref 70–99)
Potassium: 4 mmol/L (ref 3.5–5.1)
Sodium: 138 mmol/L (ref 135–145)
Total Bilirubin: 1 mg/dL (ref 0.0–1.2)
Total Protein: 7 g/dL (ref 6.5–8.1)

## 2023-03-14 LAB — ANTITHROMBIN III: AntiThromb III Func: 89 % (ref 75–120)

## 2023-03-14 NOTE — Progress Notes (Signed)
Patient is hard of hearing

## 2023-03-14 NOTE — Progress Notes (Addendum)
 [facility]  [phone] [phone]  ID: Kristopher Davis OB: 08-Oct-1955  MR#: [id number]  RDW#:[id number]  Patient Care Team: Sowles, Krichna, MD as PCP - General (Family Medicine) Chrystie Larissa RAMAN, MD as Referring Physician (Dermatology)  REFERRING PROVIDER: Dr. Glenard  REASON FOR REFERRAL: unprovoked PE  HPI: Kristopher Davis is a 68 y.o. male with past medical history of acute massive PE s/p mechanical thrombectomy on 09/16/2022 referred to hematology for hypercoagulable workup.  Patient presented to [facility] ED on 09/16/2022 for shortness of breath on exertion.  CTA chest showed acute bilateral PE in the left and right main pulmonary artery as well as all lobar branches and numerous segmental branches, suspicion for clot in PFO.  Evidence of heart strain.  Vascular surgery was consulted which recommended transfer to tertiary care for CT surgery evaluation in setting of PFO clot. S/p mechanical thrombectomy at [facility].  He was started on Eliquis .  He had some confusion about the duration of Eliquis .  He thought he was supposed to be on it for 3 months only.  He stopped Eliquis  in mid December.  Resumed on 03/09/2023 when he went for routine appointment with his primary Dr. Glenard.   Denies any provoking factors-long car ride, airflight, surgery.  No prior event of PE or DVT.  Denies any family history of PE or DVT.  REVIEW OF SYSTEMS:   ROS  As per HPI. Otherwise, a complete review of systems is negative.  PAST MEDICAL HISTORY: Past Medical History:  Diagnosis Date   Decreased libido 02/01/2007   Hypertension     PAST SURGICAL HISTORY: Past Surgical History:  Procedure Laterality Date   COLONOSCOPY WITH PROPOFOL  N/A 02/08/2022   Procedure: COLONOSCOPY WITH PROPOFOL ;  Surgeon: Therisa Bi, MD;  Location: [facility] ENDOSCOPY;  Service: Gastroenterology;  Laterality: N/A;   LUMBAR LAMINECTOMY     3 surgeries   MECHANICAL THROMBECTOMY WITH AORTOGRAM AND  INTERVENTION  08/2022   [facility]    FAMILY HISTORY: Family History  Problem Relation Age of Onset   Cancer Mother    Hypertension Sister    Anemia Daughter     HEALTH MAINTENANCE: Social History   Tobacco Use   Smoking status: Some Days    Current packs/day: 0.25    Average packs/day: 0.3 packs/day for 37.3 years (9.3 ttl pk-yrs)    Types: Cigarettes    Start date: 11/07/1985   Smokeless tobacco: Former    Quit date: 08/21/2017  Vaping Use   Vaping status: Never Used  Substance Use Topics   Alcohol use: Yes    Alcohol/week: 2.0 standard drinks of alcohol    Types: 2 Cans of beer per week   Drug use: No     Allergies  Allergen Reactions   Levofloxacin  In D5w     hallucinations    Current Outpatient Medications  Medication Sig Dispense Refill   amLODipine  (NORVASC ) 2.5 MG tablet Take 1 tablet (2.5 mg total) by mouth daily. 90 tablet 0   apixaban  (ELIQUIS ) 5 MG TABS tablet Take 5 mg by mouth 2 (two) times daily.     cyanocobalamin  (VITAMIN B12) 1000 MCG tablet Take 1,000 mcg by mouth daily. (Patient not taking: Reported on 03/09/2023)     folic acid  (FOLVITE ) 1 MG tablet Take 1 tablet (1 mg total) by mouth daily. (Patient not taking: Reported on 03/09/2023) 90 tablet 1   losartan  (COZAAR ) 100 MG tablet TAKE 1 TABLET(100 MG) BY MOUTH DAILY 90 tablet 0   tadalafil  (CIALIS )  20 MG tablet Take 1 tablet (20 mg total) by mouth every other day as needed for erectile dysfunction. Take 1 tab 1 hour prior to intercourse 6 tablet 2   thiamine  (VITAMIN B-1) 50 MG tablet Take 1 tablet (50 mg total) by mouth daily. 100 tablet 1   No current facility-administered medications for this visit.    OBJECTIVE: There were no vitals filed for this visit.   There is no height or weight on file to calculate BMI.      General: Well-developed, well-nourished, no acute distress. Eyes: Pink conjunctiva, anicteric sclera. HEENT: Normocephalic, moist mucous membranes, clear oropharnyx. Lungs: Clear to  auscultation bilaterally. Heart: Regular rate and rhythm. No rubs, murmurs, or gallops. Abdomen: Soft, nontender, nondistended. No organomegaly noted, normoactive bowel sounds. Musculoskeletal: No edema, cyanosis, or clubbing. Neuro: Alert, answering all questions appropriately. Cranial nerves grossly intact. Skin: No rashes or petechiae noted. Psych: Normal affect. Lymphatics: No cervical, calvicular, axillary or inguinal LAD.   LAB RESULTS:  Lab Results  Component Value Date   NA 138 09/16/2022   K 4.1 09/16/2022   CL 105 09/16/2022   CO2 19 (L) 09/16/2022   GLUCOSE 188 (H) 09/16/2022   BUN 10 09/16/2022   CREATININE 1.58 (H) 09/16/2022   CALCIUM 8.9 09/16/2022   PROT 7.1 09/16/2022   ALBUMIN 3.7 09/16/2022   AST 22 09/16/2022   ALT 15 09/16/2022   ALKPHOS 52 09/16/2022   BILITOT 1.1 09/16/2022   GFRNONAA 48 (L) 09/16/2022   GFRAA 79 05/06/2020    Lab Results  Component Value Date   WBC 7.9 09/16/2022   NEUTROABS 2,246 08/23/2022   HGB 15.3 09/16/2022   HCT 45.7 09/16/2022   MCV 95.6 09/16/2022   PLT 197 09/16/2022    No results found for: TIBC, FERRITIN, IRONPCTSAT   STUDIES: No results found.  ASSESSMENT AND PLAN:   Kristopher Davis is a 68 y.o. male with pmh of  acute massive PE s/p mechanical thrombectomy on 09/16/2022 referred to hematology for hypercoagulable workup.  # Acute bilateral PE with PFO clot s/p mechanical thrombectomy, unprovoked -Diagnosed 09/16/2022. CTA chest showed acute bilateral PE in the left and right main pulmonary artery as well as all lobar branches and numerous segmental branches, suspicion for clot in PFO.  Evidence of heart strain.  Vascular surgery was consulted which recommended transfer to tertiary care for CT surgery evaluation in setting of PFO clot. S/p mechanical thrombectomy at [facility].  He was started on Eliquis .  - He stopped Eliquis  in mid December.  Resumed on 03/09/2023 when he went for routine appointment with his primary  Dr. Glenard.   -Discussed with the patient that for unprovoked PE, first event the recommended duration of anticoagulation is between 6 to 12 months.  Because he had a massive extensive blood clot would prefer 12 months of anticoagulation with Eliquis .  He is tolerating it well.  Will obtain hypercoagulable workup as below.  If positive, he will need long-term anticoagulation.  I will follow-up with him in 6 months close to the anticipated time of completion which will be 09/16/2023.  Orders Placed This Encounter  Procedures   Antithrombin III    Protein C activity   Protein C, total   Beta-2 -glycoprotein i abs, IgG/M/A   Factor 5 leiden   Prothrombin gene mutation   Cardiolipin antibodies, IgG, IgM, IgA   Hexagonal Phase Phospholipid   PROTEIN S PANEL   CBC with Differential/Platelet   Comprehensive metabolic panel   D-dimer, quantitative  RTC in 6 months for MD visit, labs.  Patient expressed understanding and was in agreement with this plan. He also understands that He can call clinic at any time with any questions, concerns, or complaints.   I spent a total of 45 minutes reviewing chart data, face-to-face evaluation with the patient, counseling and coordination of care as detailed above.  Genevia Rous, MD   03/14/2023 1:33 PM

## 2023-03-15 LAB — BETA-2-GLYCOPROTEIN I ABS, IGG/M/A
Beta-2 Glyco I IgG: <9 GPI IgG units (ref 0–20)
Beta-2-Glycoprotein I IgA: <9 GPI IgA units (ref 0–25)
Beta-2-Glycoprotein I IgM: <9 GPI IgM units (ref 0–32)

## 2023-03-15 LAB — HEXAGONAL PHASE PHOSPHOLIPID: Hex Phosph Neut Test: 5 s (ref 0–11)

## 2023-03-15 LAB — CARDIOLIPIN ANTIBODIES, IGG, IGM, IGA
Anticardiolipin IgA: <9 [APL'U]/mL (ref 0–11)
Anticardiolipin IgG: <9 [GPL'U]/mL (ref 0–14)
Anticardiolipin IgM: <9 [MPL'U]/mL (ref 0–12)

## 2023-03-15 LAB — PROTEIN S PANEL
Protein S Activity: 71 % (ref 63–140)
Protein S Ag, Free: 91 % (ref 61–136)
Protein S Ag, Total: 69 % (ref 60–150)

## 2023-03-15 LAB — PROTEIN C ACTIVITY: Protein C Activity: 96 % (ref 73–180)

## 2023-03-15 LAB — HEX PHASE PHOSPHOLIPID REFLEX

## 2023-03-16 LAB — PROTEIN C, TOTAL: Protein C, Total: 83 % (ref 60–150)

## 2023-03-20 LAB — FACTOR 5 LEIDEN

## 2023-03-20 LAB — PROTHROMBIN GENE MUTATION

## 2023-03-28 DIAGNOSIS — H5203 Hypermetropia, bilateral: Secondary | ICD-10-CM | POA: Diagnosis not present

## 2023-03-28 DIAGNOSIS — H2513 Age-related nuclear cataract, bilateral: Secondary | ICD-10-CM | POA: Diagnosis not present

## 2023-03-28 DIAGNOSIS — H35033 Hypertensive retinopathy, bilateral: Secondary | ICD-10-CM | POA: Diagnosis not present

## 2023-03-28 DIAGNOSIS — H40013 Open angle with borderline findings, low risk, bilateral: Secondary | ICD-10-CM | POA: Diagnosis not present

## 2023-03-30 ENCOUNTER — Other Ambulatory Visit: Payer: Self-pay

## 2023-03-30 ENCOUNTER — Other Ambulatory Visit: Payer: Self-pay | Admitting: Family Medicine

## 2023-03-30 MED ORDER — APIXABAN 5 MG PO TABS: 5.0000 mg | ORAL_TABLET | Freq: Two times a day (BID) | ORAL | 0 refills | Status: DC

## 2023-03-31 ENCOUNTER — Ambulatory Visit: Payer: Medicare HMO | Admitting: Family Medicine

## 2023-04-04 ENCOUNTER — Ambulatory Visit: Payer: Medicare HMO | Admitting: Family Medicine

## 2023-04-05 ENCOUNTER — Ambulatory Visit: Payer: Self-pay | Admitting: Family Medicine

## 2023-04-26 ENCOUNTER — Encounter: Payer: Self-pay | Admitting: Internal Medicine

## 2023-04-26 NOTE — Telephone Encounter (Signed)
 Do you need to see pt sooner? Pt has been w/o eliquis since you last saw him.

## 2023-05-16 ENCOUNTER — Emergency Department
Admission: EM | Admit: 2023-05-16 | Discharge: 2023-05-16 | Disposition: A | Discharge status: Other Acute Inpatient Hospital | Attending: Emergency Medicine | Admitting: Emergency Medicine

## 2023-05-16 ENCOUNTER — Inpatient Hospital Stay: Admit: 2023-05-16

## 2023-05-16 ENCOUNTER — Emergency Department (HOSPITAL_COMMUNITY)

## 2023-05-16 ENCOUNTER — Inpatient Hospital Stay (HOSPITAL_COMMUNITY)

## 2023-05-16 ENCOUNTER — Inpatient Hospital Stay (HOSPITAL_COMMUNITY)
Admission: EM | Admit: 2023-05-16 | Discharge: 2023-05-19 | DRG: 024 | Disposition: A | Discharge status: Home / Self Care | Source: Other Acute Inpatient Hospital | Attending: Neurology | Admitting: Neurology

## 2023-05-16 ENCOUNTER — Emergency Department

## 2023-05-16 ENCOUNTER — Other Ambulatory Visit: Payer: Self-pay

## 2023-05-16 ENCOUNTER — Encounter: Payer: Self-pay | Admitting: Radiology

## 2023-05-16 ENCOUNTER — Emergency Department (HOSPITAL_COMMUNITY): Admitting: Certified Registered"

## 2023-05-16 ENCOUNTER — Encounter (HOSPITAL_COMMUNITY)
Admission: EM | Disposition: A | Discharge status: Home / Self Care | Payer: Self-pay | Source: Other Acute Inpatient Hospital | Attending: Neurology

## 2023-05-16 DIAGNOSIS — R29702 NIHSS score 2: Secondary | ICD-10-CM | POA: Diagnosis not present

## 2023-05-16 DIAGNOSIS — G8191 Hemiplegia, unspecified affecting right dominant side: Secondary | ICD-10-CM | POA: Diagnosis present

## 2023-05-16 DIAGNOSIS — Z8249 Family history of ischemic heart disease and other diseases of the circulatory system: Secondary | ICD-10-CM

## 2023-05-16 DIAGNOSIS — G510 Bell's palsy: Secondary | ICD-10-CM | POA: Diagnosis present

## 2023-05-16 DIAGNOSIS — R29703 NIHSS score 3: Secondary | ICD-10-CM | POA: Diagnosis present

## 2023-05-16 DIAGNOSIS — Z79899 Other long term (current) drug therapy: Secondary | ICD-10-CM

## 2023-05-16 DIAGNOSIS — R29738 NIHSS score 38: Secondary | ICD-10-CM | POA: Diagnosis not present

## 2023-05-16 DIAGNOSIS — Q2112 Patent foramen ovale: Secondary | ICD-10-CM | POA: Diagnosis not present

## 2023-05-16 DIAGNOSIS — I639 Cerebral infarction, unspecified: Secondary | ICD-10-CM | POA: Diagnosis not present

## 2023-05-16 DIAGNOSIS — R471 Dysarthria and anarthria: Secondary | ICD-10-CM | POA: Diagnosis present

## 2023-05-16 DIAGNOSIS — I63512 Cerebral infarction due to unspecified occlusion or stenosis of left middle cerebral artery: Principal | ICD-10-CM

## 2023-05-16 DIAGNOSIS — Z7901 Long term (current) use of anticoagulants: Secondary | ICD-10-CM | POA: Diagnosis not present

## 2023-05-16 DIAGNOSIS — Z881 Allergy status to other antibiotic agents status: Secondary | ICD-10-CM | POA: Diagnosis not present

## 2023-05-16 DIAGNOSIS — R4701 Aphasia: Secondary | ICD-10-CM | POA: Diagnosis not present

## 2023-05-16 DIAGNOSIS — I63412 Cerebral infarction due to embolism of left middle cerebral artery: Principal | ICD-10-CM | POA: Diagnosis present

## 2023-05-16 DIAGNOSIS — F17210 Nicotine dependence, cigarettes, uncomplicated: Secondary | ICD-10-CM | POA: Diagnosis present

## 2023-05-16 DIAGNOSIS — F17200 Nicotine dependence, unspecified, uncomplicated: Secondary | ICD-10-CM | POA: Diagnosis present

## 2023-05-16 DIAGNOSIS — E669 Obesity, unspecified: Secondary | ICD-10-CM | POA: Diagnosis not present

## 2023-05-16 DIAGNOSIS — R4189 Other symptoms and signs involving cognitive functions and awareness: Secondary | ICD-10-CM | POA: Diagnosis present

## 2023-05-16 DIAGNOSIS — E785 Hyperlipidemia, unspecified: Secondary | ICD-10-CM | POA: Diagnosis present

## 2023-05-16 DIAGNOSIS — I10 Essential (primary) hypertension: Secondary | ICD-10-CM

## 2023-05-16 DIAGNOSIS — R233 Spontaneous ecchymoses: Secondary | ICD-10-CM | POA: Diagnosis present

## 2023-05-16 DIAGNOSIS — I6389 Other cerebral infarction: Secondary | ICD-10-CM | POA: Diagnosis not present

## 2023-05-16 DIAGNOSIS — Z86711 Personal history of pulmonary embolism: Secondary | ICD-10-CM | POA: Diagnosis not present

## 2023-05-16 DIAGNOSIS — F1010 Alcohol abuse, uncomplicated: Secondary | ICD-10-CM | POA: Diagnosis present

## 2023-05-16 DIAGNOSIS — R29810 Facial weakness: Secondary | ICD-10-CM | POA: Diagnosis not present

## 2023-05-16 DIAGNOSIS — Z86718 Personal history of other venous thrombosis and embolism: Secondary | ICD-10-CM | POA: Diagnosis not present

## 2023-05-16 DIAGNOSIS — E538 Deficiency of other specified B group vitamins: Secondary | ICD-10-CM | POA: Diagnosis present

## 2023-05-16 DIAGNOSIS — R29705 NIHSS score 5: Secondary | ICD-10-CM | POA: Diagnosis not present

## 2023-05-16 DIAGNOSIS — R531 Weakness: Secondary | ICD-10-CM | POA: Diagnosis present

## 2023-05-16 DIAGNOSIS — E519 Thiamine deficiency, unspecified: Secondary | ICD-10-CM | POA: Diagnosis present

## 2023-05-16 DIAGNOSIS — F09 Unspecified mental disorder due to known physiological condition: Secondary | ICD-10-CM

## 2023-05-16 DIAGNOSIS — I6602 Occlusion and stenosis of left middle cerebral artery: Secondary | ICD-10-CM | POA: Diagnosis present

## 2023-05-16 DIAGNOSIS — F05 Delirium due to known physiological condition: Secondary | ICD-10-CM | POA: Diagnosis present

## 2023-05-16 DIAGNOSIS — Z8673 Personal history of transient ischemic attack (TIA), and cerebral infarction without residual deficits: Secondary | ICD-10-CM

## 2023-05-16 HISTORY — PX: IR CT HEAD LTD: IMG2386

## 2023-05-16 HISTORY — PX: IR PERCUTANEOUS ART THROMBECTOMY/INFUSION INTRACRANIAL INC DIAG ANGIO: IMG6087

## 2023-05-16 HISTORY — PX: RADIOLOGY WITH ANESTHESIA: SHX6223

## 2023-05-16 LAB — COMPREHENSIVE METABOLIC PANEL
ALT: 12 U/L (ref 0–44)
AST: 23 U/L (ref 15–41)
Albumin: 3.6 g/dL (ref 3.5–5.0)
Alkaline Phosphatase: 38 U/L (ref 38–126)
Anion gap: 9 (ref 5–15)
BUN: 13 mg/dL (ref 8–23)
CO2: 24 mmol/L (ref 22–32)
Calcium: 8.8 mg/dL — ABNORMAL LOW (ref 8.9–10.3)
Chloride: 107 mmol/L (ref 98–111)
Creatinine, Ser: 1.12 mg/dL (ref 0.61–1.24)
GFR, Estimated: >60 mL/min (ref >60)
Glucose, Bld: 85 mg/dL (ref 70–99)
Potassium: 3.7 mmol/L (ref 3.5–5.1)
Sodium: 140 mmol/L (ref 135–145)
Total Bilirubin: 0.8 mg/dL (ref 0.0–1.2)
Total Protein: 7 g/dL (ref 6.5–8.1)

## 2023-05-16 LAB — RESP PANEL BY RT-PCR (RSV, FLU A&B, COVID)  RVPGX2
Influenza A by PCR: NEGATIVE
Influenza B by PCR: NEGATIVE
Resp Syncytial Virus by PCR: NEGATIVE
SARS Coronavirus 2 by RT PCR: NEGATIVE

## 2023-05-16 LAB — CBC
HCT: 40.8 % (ref 39.0–52.0)
Hemoglobin: 13.7 g/dL (ref 13.0–17.0)
MCH: 30.5 pg (ref 26.0–34.0)
MCHC: 33.6 g/dL (ref 30.0–36.0)
MCV: 90.9 fL (ref 80.0–100.0)
Platelets: 234 10*3/uL (ref 150–400)
RBC: 4.49 MIL/uL (ref 4.22–5.81)
RDW: 14.6 % (ref 11.5–15.5)
WBC: 7.2 10*3/uL (ref 4.0–10.5)
nRBC: 0 % (ref 0.0–0.2)

## 2023-05-16 LAB — DIFFERENTIAL
Abs Immature Granulocytes: 0.01 10*3/uL (ref 0.00–0.07)
Basophils Absolute: 0 10*3/uL (ref 0.0–0.1)
Basophils Relative: 0 %
Eosinophils Absolute: 0 10*3/uL (ref 0.0–0.5)
Eosinophils Relative: 0 %
Immature Granulocytes: 0 %
Lymphocytes Relative: 20 %
Lymphs Abs: 1.4 10*3/uL (ref 0.7–4.0)
Monocytes Absolute: 0.4 10*3/uL (ref 0.1–1.0)
Monocytes Relative: 6 %
Neutro Abs: 5.3 10*3/uL (ref 1.7–7.7)
Neutrophils Relative %: 74 %

## 2023-05-16 LAB — HIV ANTIBODY (ROUTINE TESTING W REFLEX): HIV Screen 4th Generation wRfx: NONREACTIVE

## 2023-05-16 LAB — PROTIME-INR
INR: 1.1 (ref 0.8–1.2)
Prothrombin Time: 13.9 s (ref 11.4–15.2)

## 2023-05-16 LAB — HEMOGLOBIN A1C
Hgb A1c MFr Bld: 4.5 % — ABNORMAL LOW (ref 4.8–5.6)
Mean Plasma Glucose: 82.45 mg/dL

## 2023-05-16 LAB — APTT: aPTT: 30 s (ref 24–36)

## 2023-05-16 LAB — ETHANOL: Alcohol, Ethyl (B): 89 mg/dL — ABNORMAL HIGH (ref <10)

## 2023-05-16 LAB — CBG MONITORING, ED: Glucose-Capillary: 77 mg/dL (ref 70–99)

## 2023-05-16 LAB — MRSA NEXT GEN BY PCR, NASAL: MRSA by PCR Next Gen: NOT DETECTED

## 2023-05-16 SURGERY — RADIOLOGY WITH ANESTHESIA
Anesthesia: General

## 2023-05-16 MED ORDER — SODIUM CHLORIDE 0.9% FLUSH
3.0000 mL | Freq: Once | INTRAVENOUS | Status: AC
  Administered 2023-05-16: 3 mL via INTRAVENOUS

## 2023-05-16 MED ORDER — ASPIRIN 300 MG RE SUPP
300.0000 mg | RECTAL | Status: DC
  Filled 2023-05-16: qty 1

## 2023-05-16 MED ORDER — SODIUM CHLORIDE 0.9 % IV SOLN: INTRAVENOUS | Status: DC

## 2023-05-16 MED ORDER — CLEVIDIPINE BUTYRATE 0.5 MG/ML IV EMUL
INTRAVENOUS | Status: AC
  Administered 2023-05-16: 2 mg/h via INTRAVENOUS
  Filled 2023-05-16: qty 100

## 2023-05-16 MED ORDER — IOHEXOL 350 MG/ML SOLN
100.0000 mL | Freq: Once | INTRAVENOUS | Status: AC | PRN
Start: 2023-05-16 — End: 2023-05-16
  Administered 2023-05-16: 100 mL via INTRAVENOUS

## 2023-05-16 MED ORDER — ACETAMINOPHEN 650 MG RE SUPP: 650.0000 mg | RECTAL | Status: DC | PRN

## 2023-05-16 MED ORDER — LACTATED RINGERS IV SOLN: INTRAVENOUS | Status: DC | PRN

## 2023-05-16 MED ORDER — SODIUM CHLORIDE 0.9 % IV BOLUS
1000.0000 mL | Freq: Once | INTRAVENOUS | Status: AC
  Administered 2023-05-16: 1000 mL via INTRAVENOUS

## 2023-05-16 MED ORDER — IOHEXOL 350 MG/ML SOLN
40.0000 mL | Freq: Once | INTRAVENOUS | Status: AC | PRN
  Administered 2023-05-16: 40 mL via INTRAVENOUS

## 2023-05-16 MED ORDER — CLEVIDIPINE BUTYRATE 0.5 MG/ML IV EMUL
0.0000 mg/h | INTRAVENOUS | Status: DC
  Administered 2023-05-17 (×2): 2 mg/h via INTRAVENOUS
  Administered 2023-05-17: 8 mg/h via INTRAVENOUS
  Filled 2023-05-16 (×2): qty 50

## 2023-05-16 MED ORDER — ONDANSETRON HCL 4 MG/2ML IJ SOLN
INTRAMUSCULAR | Status: DC | PRN
Start: 2023-05-16 — End: 2023-05-16
  Administered 2023-05-16: 4 mg via INTRAVENOUS

## 2023-05-16 MED ORDER — ROCURONIUM BROMIDE 100 MG/10ML IV SOLN
INTRAVENOUS | Status: DC | PRN
  Administered 2023-05-16: 40 mg via INTRAVENOUS

## 2023-05-16 MED ORDER — STROKE: EARLY STAGES OF RECOVERY BOOK
Freq: Once | Status: AC
  Administered 2023-05-17: 1
  Filled 2023-05-16: qty 1

## 2023-05-16 MED ORDER — LIDOCAINE HCL (CARDIAC) PF 100 MG/5ML IV SOSY
PREFILLED_SYRINGE | INTRAVENOUS | Status: DC | PRN
  Administered 2023-05-16: 60 mg via INTRATRACHEAL

## 2023-05-16 MED ORDER — FENTANYL CITRATE (PF) 100 MCG/2ML IJ SOLN
INTRAMUSCULAR | Status: AC
  Filled 2023-05-16: qty 2

## 2023-05-16 MED ORDER — ACETAMINOPHEN 160 MG/5ML PO SOLN: 650.0000 mg | ORAL | Status: DC | PRN

## 2023-05-16 MED ORDER — PROPOFOL 10 MG/ML IV BOLUS
INTRAVENOUS | Status: DC | PRN
Start: 2023-05-16 — End: 2023-05-16
  Administered 2023-05-16: 50 mg via INTRAVENOUS
  Administered 2023-05-16: 100 mg via INTRAVENOUS
  Administered 2023-05-16: 50 mg via INTRAVENOUS

## 2023-05-16 MED ORDER — SUGAMMADEX SODIUM 200 MG/2ML IV SOLN
INTRAVENOUS | Status: DC | PRN
  Administered 2023-05-16: 200 mg via INTRAVENOUS

## 2023-05-16 MED ORDER — SENNOSIDES-DOCUSATE SODIUM 8.6-50 MG PO TABS: 1.0000 | ORAL_TABLET | Freq: Every evening | ORAL | Status: DC | PRN

## 2023-05-16 MED ORDER — SODIUM CHLORIDE 0.9 % IV SOLN: INTRAVENOUS | Status: DC | PRN

## 2023-05-16 MED ORDER — ACETAMINOPHEN 325 MG PO TABS: 650.0000 mg | ORAL_TABLET | ORAL | Status: DC | PRN

## 2023-05-16 MED ORDER — FENTANYL CITRATE (PF) 100 MCG/2ML IJ SOLN
INTRAMUSCULAR | Status: DC | PRN
  Administered 2023-05-16: 100 ug via INTRAVENOUS

## 2023-05-16 NOTE — Anesthesia Procedure Notes (Signed)
 Procedure Name: Intubation Date/Time: 05/16/2023 1:41 PM  Performed by: Gus Puma, CRNAPre-anesthesia Checklist: Patient identified, Emergency Drugs available, Suction available and Patient being monitored Patient Re-evaluated:Patient Re-evaluated prior to induction Oxygen Delivery Method: Circle System Utilized Preoxygenation: Pre-oxygenation with 100% oxygen Induction Type: IV induction Ventilation: Mask ventilation without difficulty Laryngoscope Size: Mac and 4 Grade View: Grade I Tube type: Oral Tube size: 7.5 mm Number of attempts: 1 Airway Equipment and Method: Stylet Placement Confirmation: ETT inserted through vocal cords under direct vision, positive ETCO2 and breath sounds checked- equal and bilateral Secured at: 23 cm Tube secured with: Tape Dental Injury: Teeth and Oropharynx as per pre-operative assessment

## 2023-05-16 NOTE — Progress Notes (Signed)
  Chaplain On-Call responded to Code Stroke notification at 1130 hours.  The patient was already in the CT area for tests, and will be placed in ED room 2 following those procedures.  Chaplain assured Staff of availability as needed.  Chaplain Morene Crocker., [facility]

## 2023-05-16 NOTE — H&P (Signed)
 NEUROLOGY H&P NOTE   Date of service: May 16, 2023 Patient Name: Kristopher Davis MRN:  [id number] DOB:  12-06-1955 Chief Complaint: "Difficulty with Speech"  History of Present Illness  Kristopher Davis is a 68 y.o. male with hx of hypertension and history of PE on eliquis who presents with trouble speaking that started this morning.  Kristopher Davis states that Kristopher Davis woke up around 0730 this morning. Kristopher Davis went downstairs to interact with his alarm and noticed that Kristopher Davis was having difficulty with that.  This was the first thing Kristopher Davis had tried to do this morning and therefore his last known well was prior to going to bed at 8:15 PM last night.   Over the course of the morning, his wife noticed that Kristopher Davis was slurring his speech and seemed to have difficulty with speaking and therefore EMS was called and Kristopher Davis was brought to [facility] as a code stroke   Kristopher Davis was evaluated with an emergent CTA/CTP, unfortunately due to bolus timing the initial CTP was not adequate and therefore Kristopher Davis was taken back for repeat procedure.  The CTA does appear to demonstrate a proximal M2 occlusion. CTP shows a proximal M2 occlusion with 40cc of penumbra. Kristopher Davis was transferred to [facility] for mechanical thrombectomy.    Last known well: 2015 on 3/17 Modified rankin score: 0-Completely asymptomatic and back to baseline post- stroke IV Thrombolysis: No, outside of window and on eliquis  Thrombectomy: Yes   NIHSS components Score: Comment  1a Level of Conscious 0[x]  1[]  2[]  3[]      1b LOC Questions 0[x]  1[]  2[]       1c LOC Commands 0[x]  1[]  2[]       2 Best Gaze 0[x]  1[]  2[]       3 Visual 0[x]  1[]  2[]  3[]      4 Facial Palsy 0[]  1[x]  2[]  3[]      5a Motor Arm - left 0[x]  1[]  2[]  3[]  4[]  UN[]    5b Motor Arm - Right 0[x]  1[]  2[]  3[]  4[]  UN[]    6a Motor Leg - Left 0[x]  1[]  2[]  3[]  4[]  UN[]    6b Motor Leg - Right 0[x]  1[]  2[]  3[]  4[]  UN[]    7 Limb Ataxia 0[x]  1[]  2[]  3[]  UN[]     8 Sensory 0[x]  1[]  2[]  UN[]      9 Best Language 0[]  1[x]  2[]  3[]      10 Dysarthria 0[]   1[x]  2[]  UN[]      11 Extinct. and Inattention 0[x]  1[]  2[]       TOTAL: 3      ROS  Comprehensive ROS performed and pertinent positives documented in the HPI  Past History   Past Medical History:  Diagnosis Date   Decreased libido 02/01/2007   Hypertension    Past Surgical History:  Procedure Laterality Date   COLONOSCOPY WITH PROPOFOL N/A 02/08/2022   Procedure: COLONOSCOPY WITH PROPOFOL;  Surgeon: Wyline Mood, MD;  Location: [facility] ENDOSCOPY;  Service: Gastroenterology;  Laterality: N/A;   LUMBAR LAMINECTOMY     3 surgeries   MECHANICAL THROMBECTOMY WITH AORTOGRAM AND INTERVENTION  08/2022   [facility]   Family History  Problem Relation Age of Onset   Cancer Mother    Hypertension Sister    Anemia Daughter    Social History   Socioeconomic History   Marital status: Married    Spouse name: Not on file   Number of children: 2   Years of education: Not on file   Highest education level: Bachelor's degree (e.g., BA, AB, BS)  Occupational History  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   Tobacco Use   Smoking status: Some Days    Current packs/day: 0.25    Average packs/day: 0.3 packs/day for 37.5 years (9.4 ttl pk-yrs)    Types: Cigarettes    Start date: 11/07/1985   Smokeless tobacco: Former    Quit date: 08/21/2017  Vaping Use   Vaping status: Never Used  Substance and Sexual Activity   Alcohol use: Yes    Alcohol/week: 2.0 standard drinks of alcohol    Types: 2 Cans of beer per week   Drug use: No   Sexual activity: Yes    Partners: Female    Comment: married  Other Topics Concern   Not on file  Social History Narrative   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, 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[employer], 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   Social Drivers of Health   Financial Resource Strain: Low Risk  (03/07/2023)   Overall Financial Resource Strain (CARDIA)    Difficulty of Paying Living Expenses: Not very hard  Food Insecurity: No Food Insecurity (03/07/2023)   Hunger Vital Sign     Worried About Running Out of Food in the Last Year: Never true    Ran Out of Food in the Last Year: Never true  Transportation Needs: No Transportation Needs (03/07/2023)   PRAPARE - Administrator, Civil Service (Medical): No    Lack of Transportation (Non-Medical): No  Physical Activity: Sufficiently Active (03/07/2023)   Exercise Vital Sign    Days of Exercise per Week: 3 days    Minutes of Exercise per Session: 140 min  Stress: No Stress Concern Present (03/07/2023)   [employer] of Occupational Health - Occupational Stress Questionnaire    Feeling of Stress : Not at all  Social Connections: Socially Integrated (03/07/2023)   Social Connection and Isolation Panel [NHANES]    Frequency of Communication with Friends and Family: More than three times a week    Frequency of Social Gatherings with Friends and Family: Three times a week    Attends Religious Services: More than 4 times per year    Active Member of Clubs or Organizations: Yes    Attends Banker Meetings: Never    Marital Status: Married   Allergies  Allergen Reactions   Levofloxacin In D5w     hallucinations    Medications   Current Facility-Administered Medications:    [START ON 05/17/2023]  stroke: early stages of recovery book, , Does not apply, Once, Pamalee Leyden, [place], NP   0.9 %  sodium chloride infusion, , Intravenous, Continuous, Shafer, [place], NP   acetaminophen (TYLENOL) tablet 650 mg, 650 mg, Oral, Q4H PRN **OR** acetaminophen (TYLENOL) 160 MG/5ML solution 650 mg, 650 mg, Per Tube, Q4H PRN **OR** acetaminophen (TYLENOL) suppository 650 mg, 650 mg, Rectal, Q4H PRN, Pamalee Leyden, [place], NP   senna-docusate (Senokot-S) tablet 1 tablet, 1 tablet, Oral, QHS PRN, Elmer Picker, NP  Current Outpatient Medications:    amLODipine (NORVASC) 2.5 MG tablet, Take 1 tablet (2.5 mg total) by mouth daily., Disp: 90 tablet, Rfl: 0   apixaban (ELIQUIS) 5 MG TABS tablet, Take 1 tablet (5 mg total) by mouth 2  (two) times daily., Disp: 60 tablet, Rfl: 0   cyanocobalamin (VITAMIN B12) 1000 MCG tablet, Take 1,000 mcg by mouth daily., Disp: , Rfl:    folic acid (FOLVITE) 1 MG tablet, Take 1 tablet (1 mg total) by mouth daily. (Patient not taking: Reported on 03/14/2023), Disp:  90 tablet, Rfl: 1   losartan (COZAAR) 100 MG tablet, TAKE 1 TABLET(100 MG) BY MOUTH DAILY, Disp: 90 tablet, Rfl: 0   tadalafil (CIALIS) 20 MG tablet, Take 1 tablet (20 mg total) by mouth every other day as needed for erectile dysfunction. Take 1 tab 1 hour prior to intercourse, Disp: 6 tablet, Rfl: 2   thiamine (VITAMIN B-1) 50 MG tablet, Take 1 tablet (50 mg total) by mouth daily., Disp: 100 tablet, Rfl: 1   Vitals   There were no vitals filed for this visit.   There is no height or weight on file to calculate BMI.  Physical Exam   Constitutional: Appears well-developed and well-nourished.  Psych: Affect appropriate to situation.  Cardiovascular: Normal rate and regular rhythm.  Respiratory: Effort normal, non-labored breathing.   Neurologic Examination  Brief exam at the bridge: Mental Status: Patient is awake, alert, oriented to person, month, age. Is able to identify a watch and glasses. Able to correctly identify the items on the NIHSS card with one word responses. Speech is with short phrases only and mildly dysarthric. Overall mild dysfluency noted.    Cranial Nerves: II: Visual Fields are full. Pupils are equal, round, and reactive to light.   III,IV, VI: EOMI without ptosis or diplopia.  V: Facial sensation is symmetric to temperature VII: Facial movement with right facial weakness VIII: hearing is intact to voice X: Unremarkable XII: tongue is midline without atrophy or fasciculations.  Motor: Tone is normal. Bulk is normal. 5/5 strength was present in all four extremities.  Sensory: Sensation is symmetric to light touch and temperature in the arms and legs. Cerebellar: FNF intact bilaterally     Labs    CBC:  Recent Labs  Lab 05/16/23 1145  WBC 7.2  NEUTROABS 5.3  HGB 13.7  HCT 40.8  MCV 90.9  PLT 234   Basic Metabolic Panel:  Lab Results  Component Value Date   NA 140 05/16/2023   K 3.7 05/16/2023   CO2 24 05/16/2023   GLUCOSE 85 05/16/2023   BUN 13 05/16/2023   CREATININE 1.12 05/16/2023   CALCIUM 8.8 (L) 05/16/2023   GFRNONAA >60 05/16/2023   GFRAA 79 05/06/2020   Lipid Panel:  Lab Results  Component Value Date   LDLCALC 70 08/23/2022   HgbA1c:  Lab Results  Component Value Date   HGBA1C 4.7 04/19/2019   Urine Drug Screen: No results found for: "LABOPIA", "COCAINSCRNUR", "LABBENZ", "AMPHETMU", "THCU", "LABBARB"  Alcohol Level     Component Value Date/Time   ETH 89 (H) 05/16/2023 1145   INR  Lab Results  Component Value Date   INR 1.1 05/16/2023   APTT  Lab Results  Component Value Date   APTT 30 05/16/2023     CT Head without contrast(Personally reviewed): Suspicious Left MCA hyperdensity in the sylvian fissure, but otherwise stable non contrast CT appearance of the brain since last year. ASPECTS 10.  CT angio Head and Neck with contrast(Personally reviewed): Positive for Left MCA M2 ELVO at the bi/trifurcation, corresponding to suspicious CT hyperdensity.  CTP indicates 40 mL of left MCA middle division territory penumbra with no significant infarct core.  Assessment   Kristopher Davis is a 68 y.o. 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  On initial evaluation at [facility], Kristopher Davis had a fairly mild aphasia and was still able to communicate fairly well.  Following his CT perfusion, Kristopher Davis was reevaluated and had a very severe aphasia, unable to  even get fragmentary expression verbally, though Kristopher Davis did still seem to be able to understand fairly well.  It was at this time that the risks and benefits of thrombectomy were discussed with the patient and his wife, both of whom agreed with proceeding. Kristopher Davis was then transferred  emergently to [facility] for VIR. His deficit has waxed and waned to some degree, but has been persistently present.  Kristopher Davis was given normal saline boluses x 2 which may have resulted in his partial improvement when seen on arrival at [facility].   Primary Diagnosis:  Cerebral infarction due to embolism of  left middle cerebral artery.   Secondary Diagnosis: Essential (primary) hypertension, history of PE on eliquis, current smoker  Recommendations  - Admitting to the Neuro ICU after VIR.  - Post-VIR order set to include frequent neuro checks and BP management.  - No antiplatelet medications or anticoagulants for at least 24 hours following VIR, unless a stent is placed.  - DVT prophylaxis with SCDs.  - Will need to be started on a statin.  - Will need to be restarted on his Eliquis if follow up CT at 24 hours is negative for hemorrhagic conversion. - TTE.  - MRI brain.  - PT/OT/Speech.  - NPO until passes swallow evaluation.  - Telemetry monitoring - Fasting lipid panel, HgbA1c - q 1hr neuro checks - Risk factor modification - PT consult, OT consult, Speech consult - BP goal following VIR per IR Team  ______________________________________________________________________   Signed, Elmer Picker, NP [facility] Risks, benefits and alternatives of IVT discussed with patient and/or family and they agreed.  I have seen and examined the patient. I have formulated the assessment and recommendations. 68 year old male presenting with waxing and waning aphasia secondary to left MCA occlusion. Kristopher Davis has been sent over from [facility] for mechanical thrombectomy. Exam reveals mild speech deficit and facial palsy with NIHSS of 3. Plan as documented above.  Electronically signed: Dr. Caryl Pina

## 2023-05-16 NOTE — ED Notes (Signed)
 EMTALA reviewed by charge RN

## 2023-05-16 NOTE — ED Notes (Signed)
 [facility] called.

## 2023-05-16 NOTE — Transfer of Care (Signed)
 Immediate Anesthesia Transfer of Care Note  Patient: Kristopher Davis  Procedure(s) Performed: RADIOLOGY WITH ANESTHESIA  Patient Location: PACU  Anesthesia Type:General  Level of Consciousness: drowsy and patient cooperative  Airway & Oxygen Therapy: Patient Spontanous Breathing  Post-op Assessment: Report given to RN, Post -op Vital signs reviewed and stable, Patient moving all extremities X 4, and Patient able to stick tongue midline  Post vital signs: Reviewed and stable  Last Vitals:  Vitals Value Taken Time  BP 137/90 05/16/23 1446  Temp    Pulse 68 05/16/23 1450  Resp 12 05/16/23 1450  SpO2 94 % 05/16/23 1450  Vitals shown include unfiled device data.  Last Pain: There were no vitals filed for this visit.       Complications: No notable events documented.

## 2023-05-16 NOTE — Progress Notes (Signed)
 CODE STROKE- PHARMACY COMMUNICATION   Time CODE STROKE called/page received: 1132  Time response to CODE STROKE was made (in person or via phone): 1137  Time Stroke Kit retrieved from [facility] (only if needed):N/A; Patient outside of window to receive TNK as LKW unknown- patient woke up with symptoms at 7:30 AM  Name of Provider/Nurse contacted:N/A  Past Medical History:  Diagnosis Date   Decreased libido 02/01/2007   Hypertension    Prior to Admission medications   Medication Sig Start Date End Date Taking? Authorizing Provider  amLODipine (NORVASC) 2.5 MG tablet Take 1 tablet (2.5 mg total) by mouth daily. 03/09/23   Alba Cory, MD  apixaban (ELIQUIS) 5 MG TABS tablet Take 1 tablet (5 mg total) by mouth 2 (two) times daily. 03/30/23   Alba Cory, MD  cyanocobalamin (VITAMIN B12) 1000 MCG tablet Take 1,000 mcg by mouth daily.    [provider]  folic acid (FOLVITE) 1 MG tablet Take 1 tablet (1 mg total) by mouth daily. Patient not taking: Reported on 03/14/2023 01/31/23   Alba Cory, MD  losartan (COZAAR) 100 MG tablet TAKE 1 TABLET(100 MG) BY MOUTH DAILY 02/27/23   Alba Cory, MD  tadalafil (CIALIS) 20 MG tablet Take 1 tablet (20 mg total) by mouth every other day as needed for erectile dysfunction. Take 1 tab 1 hour prior to intercourse 03/09/23   Alba Cory, MD  thiamine (VITAMIN B-1) 50 MG tablet Take 1 tablet (50 mg total) by mouth daily. 03/09/23   Alba Cory, MD    Merryl Hacker ,PharmD Clinical Pharmacist  05/16/2023  11:54 AM

## 2023-05-16 NOTE — Progress Notes (Signed)
 Code stroke cart activated at 1139. Dr. Amada Jupiter at bedside at 1140. Pt to CT at 1142. Melville Engen Tele Stroke RN

## 2023-05-16 NOTE — ED Triage Notes (Signed)
 Pt arrived via [facility] from home with c.o stroke like symptom. LKW @0730  this AM.

## 2023-05-16 NOTE — Anesthesia Preprocedure Evaluation (Addendum)
 Anesthesia Evaluation  Patient identified by MRN, date of birth, ID band Patient awake  Preop documentation limited or incomplete due to emergent nature of procedure.  Airway Mallampati: II       Dental  (+) Edentulous Upper, Edentulous Lower   Pulmonary Current Smoker, PE   Pulmonary exam normal        Cardiovascular hypertension, Pt. on medications Normal cardiovascular exam     Neuro/Psych  PSYCHIATRIC DISORDERS       Neuromuscular disease    GI/Hepatic   Endo/Other    Renal/GU      Musculoskeletal   Abdominal  (+) + obese  Peds  Hematology  (+) Blood dyscrasia (Eliquis)   Anesthesia Other Findings code stroke  Reproductive/Obstetrics                             Anesthesia Physical Anesthesia Plan  ASA: 3 and emergent  Anesthesia Plan: General   Post-op Pain Management:    Induction: Intravenous  PONV Risk Score and Plan: 1 and Treatment may vary due to age or medical condition  Airway Management Planned: Oral ETT  Additional Equipment:   Intra-op Plan:   Post-operative Plan: Possible Post-op intubation/ventilation  Informed Consent:      Only emergency history available  Plan Discussed with: CRNA  Anesthesia Plan Comments:        Anesthesia Quick Evaluation

## 2023-05-16 NOTE — Code Documentation (Addendum)
 Stroke Response Nurse Documentation Code Documentation  Deakin Lacek is a 68 y.o. male arriving to [facility] via [facility] on 05/16/2023 with past medical hx of HTN, smoker. On No antithrombotic. Code stroke was activated by EMS.   Patient from home where he was LKW at 2015 on 3/17 and now complaining of difficulty speaking and right facial droop. Patient went to sleep normal last night at 2015. When he woke up this morning, he had difficulty stopping his alarms. His wife called him and noticed that he sounded off and EMS was called. LVO 1 per EMS.  Stroke team at the bedside on patient arrival. Labs drawn and patient cleared for CT by Dr. Fanny Bien. Patient to CT with team. NIHSS 3, see documentation for details and code stroke times. Patient with right facial droop, Expressive aphasia , and dysarthria  on exam. The following imaging was completed:  CT Head, CTA, and CTP. Patient is not a candidate for IV Thrombolytic due to outside window, per MD. Patient is a candidate for IR due to LVO on imaging, per MD. Code IR activated. Delay due to repeat CTP needed.  Care Plan: Transfer to [facility] for IR.Covid swab sent. Report given to [facility] Stroke Response RN Meryl Dare   Bedside handoff with ED RN Virl Axe  Stroke Response RN

## 2023-05-16 NOTE — Code Documentation (Signed)
 Stroke Response Nurse Documentation Code Documentation  Brad Lieurance is a 68 y.o. male arriving to [facility]  as a preactivated Code IR. Report received from [facility] Stroke Response RN Leafy Ro. Patient went to sleep at 2015 05/15/2023 and woke up at 0730 with difficulty interacting with alarm clock. Wife noticed slurred speech and 911 was called. Cass NIH 3.  Patient arrived at [facility] at 71. Current NIH 3. Patient assessed en route to IR. Arrival to IR Suite at 1329.   Care Plan: Taken to IR   Bedside handoff with IR RN Florentina Addison.    Felecia Jan  Stroke Response RN

## 2023-05-16 NOTE — Procedures (Signed)
 INR.  Status post left common carotid arteriogram.  Right CFA approach.  Findings.  1.  Occluded mid frontal branch of the superior division of the right middle cerebral artery.  Status post complete revascularization of the occluded branch of the superior division of the right middle cerebral artery with 1 pass with an 062 and 043 complex aspiration achieving a tiki 3 revascularization.  Post CT brain no evidence of intracranial hemorrhage.  8 French Angio-Seal closure device deployed for hemostasis at the right groin puncture site.  Distal pulses all present unchanged.     Patient extubated.  Pupils 2 to 3 mm bilaterally.  Sluggishly reactive.  No facial droop.  Tongue midline.  Follow simple commands appropriately.  Moves all fours nearly equally.  Continues to have expressive difficulties.  Fatima Sanger MD.

## 2023-05-16 NOTE — Sedation Documentation (Signed)
 Patient transported to recovery area via stretcher with CRNA. Bedside report given to [facility]. Femoral site assessed - Level 0, no hematoma, dressing is clean, dry, and intact. Pulses also assessed bilaterally.

## 2023-05-16 NOTE — ED Provider Notes (Signed)
 [facility] Provider Note    Event Date/Time   First MD Initiated Contact with Patient 05/16/23 1144     (approximate)   History   stroke like symptoms    HPI  Kristopher Davis is a 68 y.o. male who presents for evaluation for weakness.  The patient was noted to have weakness on the right side of the face, symptoms were noticed at about 830 this morning however patient reports when he woke up he did not quite feel right.  It seems he has symptoms starting from the time of awakening     Physical Exam   Triage Vital Signs: ED Triage Vitals  Encounter Vitals Group     BP      Systolic BP Percentile      Diastolic BP Percentile      Pulse      Resp      Temp      Temp src      SpO2      Weight      Height      Head Circumference      Peak Flow      Pain Score      Pain Loc      Pain Education      Exclude from Growth Chart     Most recent vital signs: Vitals:   05/16/23 1211 05/16/23 1300  BP: (!) 148/96 135/78  Pulse: 76 67  Resp: 14 15  Temp:  98.2 F (36.8 C)  SpO2: 100% 100%     General: Awake, no distress.  Normocephalic atraumatic.  Very slight facial droop noted on the right.  He is slightly slow in his thought processing but when he does speak words are clear. CV:  Good peripheral perfusion.  Resp:  Normal effort.  Abd:  No distention.  Other:  Normal visual fields.  Facial/cranial nerve testing reveals slight weakness of the right side of the face.  He is also slightly slow in his cognition at least appears to have mild receptive aphasia.  Moves extremities well.     ED Results / Procedures / Treatments   Labs (all labs ordered are listed, but only abnormal results are displayed) Labs Reviewed  COMPREHENSIVE METABOLIC PANEL - Abnormal; Notable for the following components:      Result Value   Calcium 8.8 (*)    All other components within normal limits  ETHANOL - Abnormal; Notable for the following components:    Alcohol, Ethyl (B) 89 (*)    All other components within normal limits  RESP PANEL BY RT-PCR (RSV, FLU A&B, COVID)  RVPGX2  PROTIME-INR  APTT  CBC  DIFFERENTIAL  CBG MONITORING, ED  I-STAT CREATININE, ED     EKG  And interpreted by me at 1210 heart rate 75 QRS 100 QTc 460 Normal sinus rhythm no evidence of acute ischemia   RADIOLOGY  CT of the head reviewed interpreted by me, there is concern according to radiologist as well as our stroke team physician but the patient may have an acute middle cerebral artery occlusion.  CT HEAD CODE STROKE WO CONTRAST Result Date: 05/16/2023 CLINICAL DATA:  Code stroke.  68 year old male. EXAM: CT HEAD WITHOUT CONTRAST TECHNIQUE: Contiguous axial images were obtained from the base of the skull through the vertex without intravenous contrast. RADIATION DOSE REDUCTION: This exam was performed according to the departmental dose-optimization program which includes automated exposure control, adjustment of the mA and/or kV according to  patient size and/or use of iterative reconstruction technique. COMPARISON:  Head CT 09/16/2022. FINDINGS: Brain: Cavum septum pellucidum redemonstrated, normal variant. Stable cerebral volume. No midline shift, ventriculomegaly, mass effect, evidence of mass lesion, intracranial hemorrhage or evidence of cortically based acute infarction. Mild for age mostly periventricular white matter hypodensity appears stable along with gray-white differentiation in both hemispheres. Vascular: Asymmetric and suspicious hyperdensity of the left MCA bifurcation in the left sylvian fissure series 3, image 12. Skull: Stable, intact. Sinuses/Orbits: Visualized paranasal sinuses and mastoids are clear. Other: No acute orbit or scalp soft tissue finding identified. ASPECTS [facility] Stroke Program Early CT Score) Total score (0-10 with 10 being normal): 10 IMPRESSION: 1. Suspicious Left MCA hyperdensity in the sylvian fissure, but otherwise stable non  contrast CT appearance of the brain since last year. ASPECTS 10. 2. These results were communicated to Dr. Amada Jupiter at 11:59 am on 05/16/2023 by text page via the [facility] messaging system. Electronically Signed   By: Odessa Fleming M.D.   On: 05/16/2023 11:59      PROCEDURES:  Critical Care performed: Yes, see critical care procedure note(s)  Procedures   MEDICATIONS ORDERED IN ED: Medications  aspirin suppository 300 mg (300 mg Rectal Patient Refused/Not Given 05/16/23 1301)  sodium chloride flush (NS) 0.9 % injection 3 mL (3 mLs Intravenous Given 05/16/23 1225)  iohexol (OMNIPAQUE) 350 MG/ML injection 100 mL (100 mLs Intravenous Contrast Given 05/16/23 1153)  sodium chloride 0.9 % bolus 1,000 mL (1,000 mLs Intravenous Transfusing/Transfer 05/16/23 1306)  iohexol (OMNIPAQUE) 350 MG/ML injection 40 mL (40 mLs Intravenous Contrast Given 05/16/23 1239)  sodium chloride 0.9 % bolus 1,000 mL (1,000 mLs Intravenous Transfusing/Transfer 05/16/23 1305)     IMPRESSION / MDM / ASSESSMENT AND PLAN / ED COURSE  I reviewed the triage vital signs and the nursing notes.                              Differential diagnosis includes, but is not limited to, acute stroke, intracranial hemorrhage, metabolic abnormality, recrudescence, etc.  Patient's presentation is most consistent with acute presentation with potential threat to life or bodily function.      CBC normal  Metabolic panel reassuring without acute departure with exception of mild hypocalcemia  ----------------------------------------- 12:12 PM on 05/16/2023 ----------------------------------------- Dr. Onalee Hua with the patient at this time directing advanced imaging studies.   ----------------------------------------- 12:45 PM on 05/16/2023 ----------------------------------------- Patient has been accepted and emergent transfer to [facility] by Dr. Onalee Hua, MD for concerns of need for emergent  neurovascular/interventional evaluation for large vessel occlusion.  Patient is resting comfortably, stable for emergent transfer via [facility].  Vitals:   05/16/23 1211 05/16/23 1300  BP: (!) 148/96 135/78  Pulse: 76 67  Resp: 14 15  Temp:  98.2 F (36.8 C)  SpO2: 100% 100%     FINAL CLINICAL IMPRESSION(S) / ED DIAGNOSES   Final diagnoses:  Ischemic stroke (HCC)     Rx / DC Orders   ED Discharge Orders     None        Note:  This document was prepared using Dragon voice recognition software and may include unintentional dictation errors.   Sharyn Creamer, MD 05/16/23 [phone]

## 2023-05-16 NOTE — Consult Note (Addendum)
 NEUROLOGY CONSULT NOTE   Date of service: May 16, 2023 Patient Name: Kristopher Davis MRN:  [id number] DOB:  [birth year] Chief Complaint: "Difficulty with speech" Requesting Provider: Sharyn Creamer, MD  History of Present Illness  Kristopher Davis is a 68 y.o. male with hx of hypertension presents with trouble speaking started this morning.  He states that he woke up around 730, and then went downstairs to interact with his alarm and noticed that he was having difficulty with that.  This was the first thing he had tried and therefore his last known well was prior to going to bed at 8:15 PM last night.  Over the course of the morning, his wife noticed that he was slurring his speech and seemed to have difficulty with speaking and therefore nine one was called and he was brought to [facility] as a code stroke.  He was evaluated with an emergent CTA/CTP, unfortunately due to bolus timing the initial CTP was not adequate and therefore he was taken back for repeat procedure.  The CTA does appear to demonstrate a proximal M2 occlusion.  LKW: 8:15 PM Modified rankin score: 0-Completely asymptomatic and back to baseline post- stroke IV Thrombolysis: No, outside of window EVT: transferred to [facility]  NIHSS components Score: Comment  1a Level of Conscious 0[x]  1[]  2[]  3[]      1b LOC Questions 0[x]  1[]  2[]       1c LOC Commands 0[x]  1[]  2[]       2 Best Gaze 0[x]  1[]  2[]       3 Visual 0[x]  1[]  2[]  3[]      4 Facial Palsy 0[]  1[x]  2[]  3[]      5a Motor Arm - left 0[x]  1[]  2[]  3[]  4[]  UN[]    5b Motor Arm - Right 0[x]  1[]  2[]  3[]  4[]  UN[]    6a Motor Leg - Left 0[x]  1[]  2[]  3[]  4[]  UN[]    6b Motor Leg - Right 0[x]  1[]  2[]  3[]  4[]  UN[]    7 Limb Ataxia 0[x]  1[]  2[]  3[]  UN[]     8 Sensory 0[x]  1[]  2[]  UN[]      9 Best Language 0[]  1[x]  2[]  3[]      10 Dysarthria 0[]  1[x]  2[]  UN[]      11 Extinct. and Inattention 0[x]  1[]  2[]       TOTAL: 3      Past History   Past Medical History:  Diagnosis Date   Decreased  libido 02/01/2007   Hypertension     Past Surgical History:  Procedure Laterality Date   COLONOSCOPY WITH PROPOFOL N/A 02/08/2022   Procedure: COLONOSCOPY WITH PROPOFOL;  Surgeon: Wyline Mood, MD;  Location: [facility] ENDOSCOPY;  Service: Gastroenterology;  Laterality: N/A;   LUMBAR LAMINECTOMY     3 surgeries   MECHANICAL THROMBECTOMY WITH AORTOGRAM AND INTERVENTION  08/2022   [facility]    Family History: Family History  Problem Relation Age of Onset   Cancer Mother    Hypertension Sister    Anemia Daughter     Social History  reports that he has been smoking cigarettes. He started smoking about 37 years ago. He has a 9.4 pack-year smoking history. He quit smokeless tobacco use about 5 years ago. He reports current alcohol use of about 2.0 standard drinks of alcohol per week. He reports that he does not use drugs.  Allergies  Allergen Reactions   Levofloxacin In D5w     hallucinations    Medications   Current Facility-Administered Medications:    sodium chloride flush (NS) 0.9 % injection 3 mL,  3 mL, Intravenous, Once, Rejeana Brock, MD  Current Outpatient Medications:    amLODipine (NORVASC) 2.5 MG tablet, Take 1 tablet (2.5 mg total) by mouth daily., Disp: 90 tablet, Rfl: 0   apixaban (ELIQUIS) 5 MG TABS tablet, Take 1 tablet (5 mg total) by mouth 2 (two) times daily., Disp: 60 tablet, Rfl: 0   cyanocobalamin (VITAMIN B12) 1000 MCG tablet, Take 1,000 mcg by mouth daily., Disp: , Rfl:    folic acid (FOLVITE) 1 MG tablet, Take 1 tablet (1 mg total) by mouth daily. (Patient not taking: Reported on 03/14/2023), Disp: 90 tablet, Rfl: 1   losartan (COZAAR) 100 MG tablet, TAKE 1 TABLET(100 MG) BY MOUTH DAILY, Disp: 90 tablet, Rfl: 0   tadalafil (CIALIS) 20 MG tablet, Take 1 tablet (20 mg total) by mouth every other day as needed for erectile dysfunction. Take 1 tab 1 hour prior to intercourse, Disp: 6 tablet, Rfl: 2   thiamine (VITAMIN B-1) 50 MG tablet, Take 1 tablet (50 mg  total) by mouth daily., Disp: 100 tablet, Rfl: 1  Vitals   Vitals:   05-28-23 1206 May 28, 2023 1208  BP:  (!) 148/96  Pulse:  76  Resp:  16  Temp:  98.2 F (36.8 C)  TempSrc:  Oral  SpO2:  100%  Weight: 126.1 kg   Height: 6\' 4"  (1.93 m)     Body mass index is 33.84 kg/m.  Physical Exam   Constitutional: Appears well-developed and well-nourished.   Neurologic Examination    Neuro: Mental Status: Patient is awake, alert, oriented to person, place, month, age. He has a significant expressive aphasia.  Cranial Nerves: II: Visual Fields are full. Pupils are equal, round, and reactive to light.   III,IV, VI: EOMI without ptosis or diploplia.  V: Facial sensation is symmetric to temperature VII: Facial movement with right facial weakness VIII: hearing is intact to voice X: Uvula elevates symmetrically XII: tongue is midline without atrophy or fasciculations.  Motor: Tone is normal. Bulk is normal. 5/5 strength was present in all four extremities.  Sensory: Sensation is symmetric to light touch and temperature in the arms and legs. Cerebellar: FNF intact bilaterally        Labs/Imaging/Neurodiagnostic studies   CBC:  Recent Labs  Lab 05-28-23 1145  WBC 7.2  NEUTROABS 5.3  HGB 13.7  HCT 40.8  MCV 90.9  PLT 234   Basic Metabolic Panel:  Lab Results  Component Value Date   NA 140 05/28/23   K 3.7 May 28, 2023   CO2 24 May 28, 2023   GLUCOSE 85 2023-05-28   BUN 13 2023-05-28   CREATININE 1.12 May 28, 2023   CALCIUM 8.8 (L) 28-May-2023   GFRNONAA >60 May 28, 2023   GFRAA 79 05/06/2020   Lipid Panel:  Lab Results  Component Value Date   LDLCALC 70 08/23/2022   HgbA1c:  Lab Results  Component Value Date   HGBA1C 4.7 04/19/2019   Urine Drug Screen: No results found for: "LABOPIA", "COCAINSCRNUR", "LABBENZ", "AMPHETMU", "THCU", "LABBARB"  Alcohol Level     Component Value Date/Time   ETH 89 (H) May 28, 2023 1145   INR  Lab Results  Component Value Date    INR 1.1 2023-05-28   APTT  Lab Results  Component Value Date   APTT 30 05-28-23    CT Head without contrast(Personally reviewed): Aspects 10  CTA: Proximal M2 occlusion with 40 cc of penumbra  ASSESSMENT   Audra Bellard is a 68 y.o. male with a proximal M2 occlusion with resultant aphasia.  On initial evaluation, he had a fairly mild aphasia and was still able to communicate fairly well.  Following his CT perfusion, he was reevaluated and had a very severe aphasia, unable to even get fragmentary expression now, though he did still seem to be able to understand fairly well.  It was at this time that the risks and benefits of thrombectomy were discussed with the patient and his wife, both of whom agreed with proceeding.  This deficit has waxed and waned to some degree, but has been persistently present.  He was given normal saline boluses x 2.  RECOMMENDATIONS  -Transfer to [facility] for emergent thrombectomy - HgbA1c, fasting lipid panel - MRI of the brain without contrast - Frequent neuro checks - Echocardiogram - Prophylactic therapy-aspirin 81 mg and Plavix 75 mg daily after 300 mg load - Risk factor modification - Telemetry monitoring - PT consult, OT consult, Speech consult  ______________________________________________________________________    This patient is critically ill and at significant risk of neurological worsening, death and care requires constant monitoring of vital signs, hemodynamics,respiratory and cardiac monitoring, neurological assessment, discussion with family, other specialists and medical decision making of high complexity. I spent 65 minutes of neurocritical care time  in the care of  this patient. This was time spent independent of any time provided by nurse practitioner or PA.  Ritta Slot, MD [facility]   If 7pm- 7am, please page neurology on call as listed in AMION. 05/16/2023  12:59 PM

## 2023-05-16 NOTE — Anesthesia Postprocedure Evaluation (Signed)
 Anesthesia Post Note  Patient: Scientist, research (medical)  Procedure(s) Performed: RADIOLOGY WITH ANESTHESIA     Patient location during evaluation: PACU Anesthesia Type: General Level of consciousness: awake Pain management: pain level controlled Vital Signs Assessment: post-procedure vital signs reviewed and stable Respiratory status: spontaneous breathing, nonlabored ventilation and respiratory function stable Cardiovascular status: blood pressure returned to baseline and stable Postop Assessment: no apparent nausea or vomiting Anesthetic complications: no   No notable events documented.  Last Vitals:  Vitals:   05/16/23 1700 05/16/23 1730  BP: (!) 143/78 128/73  Pulse: 70 76  Resp: 11 13  Temp:    SpO2: 95% 95%    Last Pain:  Vitals:   05/16/23 1530  PainSc: 0-No pain                 Keidan Aumiller P Brysyn Brandenberger

## 2023-05-16 NOTE — Sedation Documentation (Signed)
 Patient moved to table by staff , secured, hooked to monitors, and now under the care of anesthesia. Please see charting in vitals per CRNA.

## 2023-05-17 ENCOUNTER — Encounter (HOSPITAL_COMMUNITY): Payer: Self-pay | Admitting: Radiology

## 2023-05-17 ENCOUNTER — Inpatient Hospital Stay (HOSPITAL_COMMUNITY)

## 2023-05-17 ENCOUNTER — Telehealth (HOSPITAL_COMMUNITY): Payer: Self-pay | Admitting: Pharmacy Technician

## 2023-05-17 ENCOUNTER — Other Ambulatory Visit (HOSPITAL_COMMUNITY): Payer: Self-pay

## 2023-05-17 DIAGNOSIS — I63412 Cerebral infarction due to embolism of left middle cerebral artery: Secondary | ICD-10-CM

## 2023-05-17 DIAGNOSIS — I639 Cerebral infarction, unspecified: Secondary | ICD-10-CM | POA: Diagnosis not present

## 2023-05-17 DIAGNOSIS — I6389 Other cerebral infarction: Secondary | ICD-10-CM

## 2023-05-17 LAB — CBC WITH DIFFERENTIAL/PLATELET
Abs Immature Granulocytes: 0.01 10*3/uL (ref 0.00–0.07)
Basophils Absolute: 0 10*3/uL (ref 0.0–0.1)
Basophils Relative: 1 %
Eosinophils Absolute: 0.1 10*3/uL (ref 0.0–0.5)
Eosinophils Relative: 1 %
HCT: 37.8 % — ABNORMAL LOW (ref 39.0–52.0)
Hemoglobin: 12.8 g/dL — ABNORMAL LOW (ref 13.0–17.0)
Immature Granulocytes: 0 %
Lymphocytes Relative: 27 %
Lymphs Abs: 1.2 10*3/uL (ref 0.7–4.0)
MCH: 30.7 pg (ref 26.0–34.0)
MCHC: 33.9 g/dL (ref 30.0–36.0)
MCV: 90.6 fL (ref 80.0–100.0)
Monocytes Absolute: 0.5 10*3/uL (ref 0.1–1.0)
Monocytes Relative: 10 %
Neutro Abs: 2.7 10*3/uL (ref 1.7–7.7)
Neutrophils Relative %: 61 %
Platelets: 201 10*3/uL (ref 150–400)
RBC: 4.17 MIL/uL — ABNORMAL LOW (ref 4.22–5.81)
RDW: 14.8 % (ref 11.5–15.5)
WBC: 4.5 10*3/uL (ref 4.0–10.5)
nRBC: 0 % (ref 0.0–0.2)

## 2023-05-17 LAB — RAPID URINE DRUG SCREEN, HOSP PERFORMED
Amphetamines: NOT DETECTED
Barbiturates: NOT DETECTED
Benzodiazepines: NOT DETECTED
Cocaine: NOT DETECTED
Opiates: NOT DETECTED
Tetrahydrocannabinol: NOT DETECTED

## 2023-05-17 LAB — ECHOCARDIOGRAM COMPLETE
AR max vel: 3.76 cm2
AV Peak grad: 3.5 mmHg
Ao pk vel: 0.94 m/s
Area-P 1/2: 2.8 cm2
Height: 76 in
S' Lateral: 3.5 cm
Weight: 4448 [oz_av]

## 2023-05-17 LAB — LIPID PANEL
Cholesterol: 150 mg/dL (ref 0–200)
HDL: 71 mg/dL (ref >40)
LDL Cholesterol: 64 mg/dL (ref 0–99)
Total CHOL/HDL Ratio: 2.1 ratio
Triglycerides: 74 mg/dL (ref <150)
VLDL: 15 mg/dL (ref 0–40)

## 2023-05-17 LAB — BASIC METABOLIC PANEL
Anion gap: 8 (ref 5–15)
BUN: 8 mg/dL (ref 8–23)
CO2: 23 mmol/L (ref 22–32)
Calcium: 7.9 mg/dL — ABNORMAL LOW (ref 8.9–10.3)
Chloride: 106 mmol/L (ref 98–111)
Creatinine, Ser: 1.18 mg/dL (ref 0.61–1.24)
GFR, Estimated: >60 mL/min (ref >60)
Glucose, Bld: 72 mg/dL (ref 70–99)
Potassium: 3.2 mmol/L — ABNORMAL LOW (ref 3.5–5.1)
Sodium: 137 mmol/L (ref 135–145)

## 2023-05-17 MED ORDER — ORAL CARE MOUTH RINSE: 15.0000 mL | OROMUCOSAL | Status: DC | PRN

## 2023-05-17 MED ORDER — CLOPIDOGREL BISULFATE 75 MG PO TABS
75.0000 mg | ORAL_TABLET | Freq: Every day | ORAL | Status: DC
  Administered 2023-05-17 – 2023-05-18 (×2): 75 mg via ORAL
  Filled 2023-05-17 (×2): qty 1

## 2023-05-17 MED ORDER — ENOXAPARIN SODIUM 60 MG/0.6ML IJ SOSY
60.0000 mg | PREFILLED_SYRINGE | INTRAMUSCULAR | Status: DC
  Administered 2023-05-17 – 2023-05-18 (×2): 60 mg via SUBCUTANEOUS
  Filled 2023-05-17 (×2): qty 0.6

## 2023-05-17 MED ORDER — AMLODIPINE BESYLATE 2.5 MG PO TABS
2.5000 mg | ORAL_TABLET | Freq: Every day | ORAL | Status: DC
Start: 2023-05-17 — End: 2023-05-19
  Administered 2023-05-17 – 2023-05-19 (×3): 2.5 mg via ORAL
  Filled 2023-05-17 (×3): qty 1

## 2023-05-17 MED ORDER — NICOTINE 7 MG/24HR TD PT24
7.0000 mg | MEDICATED_PATCH | Freq: Every day | TRANSDERMAL | Status: DC
Start: 2023-05-17 — End: 2023-05-19
  Filled 2023-05-17 (×2): qty 1

## 2023-05-17 MED ORDER — POTASSIUM CHLORIDE CRYS ER 20 MEQ PO TBCR
40.0000 meq | EXTENDED_RELEASE_TABLET | ORAL | Status: AC
  Administered 2023-05-17: 40 meq via ORAL
  Filled 2023-05-17: qty 2

## 2023-05-17 MED ORDER — ASPIRIN 81 MG PO TBEC
81.0000 mg | DELAYED_RELEASE_TABLET | Freq: Every day | ORAL | Status: DC
  Administered 2023-05-17 – 2023-05-18 (×2): 81 mg via ORAL
  Filled 2023-05-17 (×2): qty 1

## 2023-05-17 MED ORDER — LOSARTAN POTASSIUM 50 MG PO TABS
100.0000 mg | ORAL_TABLET | Freq: Every day | ORAL | Status: DC
  Administered 2023-05-17 – 2023-05-19 (×3): 100 mg via ORAL
  Filled 2023-05-17 (×3): qty 2

## 2023-05-17 MED ORDER — CHLORHEXIDINE GLUCONATE CLOTH 2 % EX PADS
6.0000 | MEDICATED_PAD | Freq: Every day | CUTANEOUS | Status: DC
  Administered 2023-05-17: 6 via TOPICAL

## 2023-05-17 NOTE — Evaluation (Signed)
 Physical Therapy Evaluation Patient Details Name: Kristopher Davis MRN: [id number] DOB: [birth year] Today's Date: 05/17/2023  History of Present Illness  68 y/o male [facility] on 3/18 with trouble speaking that started in the morning. Radiology report findings suggest an occluded mid frontal branch of the superior division of the right middle cerebral artery. MRI findings suggest patchy small volume acute left MCA distribution infarct. S/p L common carotid arteriogram and  revascularization of the occluded branch of the superior division of the right middle cerebral artery on 3/18. PMH includes: pulmonary embolism and HTN.  Clinical Impression  Patient lives at home with his wife and is independent with ADLs/IADLs prior to recent admission for the above diagnosis. Patient currently presents with communication impairments, demonstrating difficulty expressing himself. Patient is A&Ox4, upon entry. Patient is independent and able to transition himself EOB and supervision to ensure safety for sit > stand transfer. Patient ambulated 227ft around the unit while pushing an IV pole, requiring CGA to ensure safety. Patient able to dual-task while ambulating, tracking room number and recognize colors around the unit. Deferred stairs due to BP parameters, at this time (SBP 120-140). Acute PT will follow-up to assess tolerability for stair climbing and DGI. Thank you for this consult.       If plan is discharge home, recommend the following: Assist for transportation;Help with stairs or ramp for entrance;Supervision due to cognitive status   Can travel by private vehicle        Equipment Recommendations None recommended by PT  Recommendations for Other Services       Functional Status Assessment Patient has had a recent decline in their functional status and demonstrates the ability to make significant improvements in function in a reasonable and predictable amount of time.     Precautions /  Restrictions Precautions Precautions: Other (comment) Recall of Precautions/Restrictions: Intact Required Braces or Orthoses:  (Po)      Mobility  Bed Mobility Overal bed mobility: Independent                  Transfers Overall transfer level: Needs assistance Equipment used: None Transfers: Sit to/from Stand Sit to Stand: Supervision           General transfer comment: Powers up using bilateral UE/LE support    Ambulation/Gait Ambulation/Gait assistance: Contact guard assist Gait Distance (Feet): 250 Feet Assistive device: IV Pole Gait Pattern/deviations: Drifts right/left       General Gait Details: Slight staggering when ambulating, otherwise able to dual-task and recite colors and track room number. Will assess stair negotiation next session  Stairs            Wheelchair Mobility     Tilt Bed    Modified Rankin (Stroke Patients Only) Modified Rankin (Stroke Patients Only) Pre-Morbid Rankin Score: No symptoms Modified Rankin: Moderate disability     Balance                                             Pertinent Vitals/Pain Pain Assessment Pain Assessment: No/denies pain    Home Living Family/patient expects to be discharged to:: Private residence Living Arrangements: Spouse/significant other Available Help at Discharge: Family Type of Home: House Home Access: Stairs to enter Entrance Stairs-Rails: Left Entrance Stairs-Number of Steps: 1 Alternate Level Stairs-Number of Steps: 13 Home Layout: Two level Home Equipment: Hand held shower head  Prior Function Prior Level of Function : Independent/Modified Independent;Working/employed;Driving;Patient poor historian/Family not available               ADLs Comments: Ind w/ ADLs/IADLs     Extremity/Trunk Assessment   Upper Extremity Assessment Upper Extremity Assessment: Defer to OT evaluation    Lower Extremity Assessment Lower Extremity Assessment:  Overall WFL for tasks assessed    Cervical / Trunk Assessment Cervical / Trunk Assessment: Normal  Communication   Communication Communication: Impaired Factors Affecting Communication: Other (comment) (Has some difficulty expressing)    Cognition Arousal: Alert Behavior During Therapy: WFL for tasks assessed/performed   PT - Cognitive impairments: Memory, Difficult to assess                         Following commands: Intact       Cueing Cueing Techniques: Verbal cues, Gestural cues     General Comments General comments (skin integrity, edema, etc.): Supine  BP 142/82 (99)  O2 96% on RA   RR 11bpm  HR 74bpm; Seated  BP 142/81 (98)  O2 95% on RA  RR 11bpm  HR 80bpm    Exercises     Assessment/Plan    PT Assessment Patient needs continued PT services  PT Problem List Decreased activity tolerance;Decreased balance;Decreased mobility;Decreased coordination;Decreased cognition;Decreased safety awareness       PT Treatment Interventions Gait training;Stair training;Functional mobility training;Therapeutic activities;Balance training;Neuromuscular re-education;Cognitive remediation;Patient/family education;Therapeutic exercise    PT Goals (Current goals can be found in the Care Plan section)  Acute Rehab PT Goals Patient Stated Goal: Return to PLOF PT Goal Formulation: With patient Time For Goal Achievement: 05/31/23 Potential to Achieve Goals: Good    Frequency Min 2X/week     Co-evaluation               AM-PAC PT "6 Clicks" Mobility  Outcome Measure Help needed turning from your back to your side while in a flat bed without using bedrails?: None Help needed moving from lying on your back to sitting on the side of a flat bed without using bedrails?: None Help needed moving to and from a bed to a chair (including a wheelchair)?: A Little Help needed standing up from a chair using your arms (e.g., wheelchair or bedside chair)?: A Little Help needed to  walk in hospital room?: A Little Help needed climbing 3-5 steps with a railing? : A Lot 6 Click Score: 19    End of Session Equipment Utilized During Treatment: Gait belt Activity Tolerance: Patient tolerated treatment well Patient left: in chair;with call bell/phone within reach;with chair alarm set;with family/visitor present Nurse Communication: Mobility status;Other (comment) (Check R groin dressing) PT Visit Diagnosis: Other symptoms and signs involving the nervous system (Z61.096)    Time: 0924-1000 PT Time Calculation (min) (ACUTE ONLY): 36 min   Charges:   PT Evaluation $PT Eval Low Complexity: 1 Low PT Treatments $Therapeutic Activity: 8-22 mins PT General Charges $$ ACUTE PT VISIT: 1 Visit         Doreen Beam, SPT   Kristopher Davis 05/17/2023, 11:52 AM

## 2023-05-17 NOTE — Progress Notes (Signed)
 STE II alarms sounding on Tele monitor. EKG obtained, revealing Sinus Rhythm with a 1st Degree AV block. Patient exhibited no signs of distress and denies chest pain or discomfort. EKG placed in chart.   Signed,  Charmian Muff, RN  1:30 AM 05/17/2023

## 2023-05-17 NOTE — Plan of Care (Signed)
  Problem: Acute Rehab PT Goals(only PT should resolve) Goal: Patient Will Transfer Sit To/From Stand Flowsheets (Taken 05/17/2023 1153) Patient will transfer sit to/from stand: Independently Goal: Pt Will Ambulate Flowsheets (Taken 05/17/2023 1152) Pt will Ambulate:  > 125 feet  with supervision Goal: Pt Will Go Up/Down Stairs Flowsheets (Taken 05/17/2023 1152) Pt will Go Up / Down Stairs:  Flight  with contact guard assist  with rail(s)

## 2023-05-17 NOTE — Evaluation (Signed)
 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 09/16/2022   History of pulmonary embolism 09/16/2022   B12 deficiency 07/13/2022   Adenomatous polyp of colon 02/08/2022   Cognitive dysfunction 08/19/2021   Vitamin B1 deficiency 08/19/2021   Folate deficiency 08/19/2021   Radiculitis of leg 08/19/2021   Erectile dysfunction 12/04/2019   Other hyperlipidemia 12/03/2019   Aortic atherosclerosis (HCC) 08/24/2017   Lipoma 10/28/2009   Disease of hair and hair follicles 07/28/2009   Benign essential HTN 01/07/2009   Decreased libido 02/01/2007   CD (contact dermatitis) 10/10/2006   Past Medical History:  Past Medical History:  Diagnosis Date   Decreased libido 02/01/2007   Hypertension    Past Surgical History:  Past Surgical History:  Procedure Laterality Date   COLONOSCOPY WITH PROPOFOL N/A 02/08/2022   Procedure: COLONOSCOPY WITH PROPOFOL;  Surgeon: Wyline Mood, MD;  Location: [facility] ENDOSCOPY;  Service: Gastroenterology;  Laterality: N/A;   LUMBAR LAMINECTOMY     3 surgeries   MECHANICAL THROMBECTOMY WITH AORTOGRAM AND INTERVENTION  08/2022   [facility]   RADIOLOGY WITH ANESTHESIA N/A 05/16/2023   Procedure: RADIOLOGY WITH ANESTHESIA;  Surgeon: Radiologist, Medication, MD;  Location: [facility] OR;  Service: Radiology;  Laterality: N/A;   HPI:  Kristopher Davis is a 68 y.o. 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  On initial evaluation at [facility], he had a fairly mild  aphasia and was still able to communicate fairly well.  Following his CT perfusion, he was reevaluated and had a very severe aphasia, unable to even get fragmentary expression verbally, though he did still seem to be able to understand fairly well.  It was at this time that the risks and benefits of thrombectomy were discussed with the patient and his wife, both of whom agreed with proceeding. He was then transferred emergently to [facility] for VIR.   Assessment / Plan / Recommendation Clinical Impression   Pt presents with non-fluent mixed expressive and receptive aphasia. There is potential for a motor planning deficit, though further assessment is needed. OME unremarkable. Expressive deficits characterized by word-finding difficulty, reduced repetition, intermittent halting speech, reduced repetition at the phrase and sentence level, and intermittent phonemic paraphasias. Some moments of more fluent speech observed, though utterances were more nonsensical. Some moments of slight, possible oral groping, though further assessment needed. Receptive language deficits characterized by reduced auditory comprehension of moderate level complex 2-step commands.   SLP reviewed results of speech/language assessment and recommendation for continued SLP intervention during hospitalization and at next level of care. Pt verbalized understanding, though anticipate ongoing education may be beneficial.   Continue SLP PoC to address speech/language goals.     SLP Assessment  SLP Recommendation/Assessment: Patient needs continued Speech Lanaguage Pathology Services SLP Visit Diagnosis: Aphasia (R47.01);Apraxia (R48.2)    Recommendations for follow up therapy are one component of a multi-disciplinary discharge planning process, led by the attending physician.  Recommendations may be updated based on patient status, additional functional criteria and insurance authorization.    Follow Up Recommendations  Follow  physician's  recommendations for discharge plan and follow up therapies (SLP intervention at next level of care)    Assistance Recommended at Discharge   (defer to PT/OT)  Functional Status Assessment Patient has had a recent decline in their functional status and demonstrates the ability to make significant improvements in function in a reasonable and predictable amount of time.  Frequency and Duration min 1 x/week  2 weeks      SLP Evaluation Cognition  Overall Cognitive Status: Impaired/Different from baseline Arousal/Alertness: Awake/alert Orientation Level: Oriented to person;Disoriented to situation;Oriented to time;Disoriented to place (oriented to "hospital" and "[place]") Year: 2025 Month: March Day of Week:  (did not know) Attention: Focused Focused Attention: Appears intact Safety/Judgment: Impaired       Comprehension  Auditory Comprehension Overall Auditory Comprehension: Impaired Yes/No Questions: Impaired Complex Questions: 75-100% accurate (additional processing time needed) Commands: Impaired Complex Commands: 75-100% accurate Conversation: Simple Interfering Components: Processing speed EffectiveTechniques: Extra processing time;Repetition;Stressing words Visual Recognition/Discrimination Discrimination: Not tested Reading Comprehension Reading Status: Not tested    Expression Expression Primary Mode of Expression: Verbal Verbal Expression Overall Verbal Expression: Impaired Initiation: Impaired (appears mild) Automatic Speech: Day of week;Month of year (delayed initiation, though able to complete independently) Level of Generative/Spontaneous Verbalization: Sentence;Conversation Repetition: Impaired Level of Impairment: Sentence level;Phrase level Naming: No impairment Pragmatics: Impairment Impairments: Monotone (low volume/mumbling) Non-Verbal Means of Communication: Not applicable Written Expression Written Expression: Not tested   Oral / Motor  Oral  Motor/Sensory Function Overall Oral Motor/Sensory Function: Within functional limits Motor Speech Overall Motor Speech: Appears within functional limits for tasks assessed Phonation: Normal Resonance: Within functional limits Articulation: Within functional limitis Intelligibility: Intelligible Motor Planning: Impaired Level of Impairment: Word (suspect some mild motor planning deficit) Motor Speech Errors: Unaware;Groping for words (occasional and subtle groping)            Ellery Plunk 05/17/2023, 9:59 AM

## 2023-05-17 NOTE — Evaluation (Signed)
 Occupational Therapy Evaluation Patient Details Name: Kristopher Davis MRN: [id number] DOB: 1956-01-14 Today's Date: 05/17/2023   History of Present Illness   68 y/o male presents to [facility] on 3/18 with trouble speaking that started in the morning. Radiology report findings suggest an occluded mid frontal branch of the superior division of the right middle cerebral artery. MRI findings suggest patchy small volume acute left MCA distribution infarct. S/p L common carotid arteriogram and  revascularization of the occluded branch of the superior division of the right middle cerebral artery on 3/18. PMH includes: pulmonary embolism and HTN.     Clinical Impressions PT admitted with L MCA . Pt currently with functional limitiations due to the deficits listed below (see OT problem list). PT was independent prior to new onset of symptoms. Pt at this time demonstrates cognitive deficits and decreased dual tasking. Pt flat affect and needs cues to initiate at times during session.  Pt will benefit from skilled OT to increase their independence and safety with adls and balance to allow discharge outpatient.      If plan is discharge home, recommend the following:   A little help with walking and/or transfers     Functional Status Assessment   Patient has had a recent decline in their functional status and demonstrates the ability to make significant improvements in function in a reasonable and predictable amount of time.     Equipment Recommendations   None recommended by OT     Recommendations for Other Services         Precautions/Restrictions   Precautions Precautions: Other (comment) Recall of Precautions/Restrictions: Intact Precaution/Restrictions Comments: SBP 120-140 Required Braces or Orthoses:   Restrictions Weight Bearing Restrictions Per Provider Order: No     Mobility Bed Mobility Overal bed mobility: Independent                  Transfers Overall transfer  level: Needs assistance Equipment used: None Transfers: Sit to/from Stand Sit to Stand: Supervision                  Balance Overall balance assessment: Mild deficits observed, not formally tested                                         ADL either performed or assessed with clinical judgement   ADL Overall ADL's : Needs assistance/impaired Eating/Feeding: Modified independent   Grooming: Oral care;Set up   Upper Body Bathing: Set up   Lower Body Bathing: Set up           Toilet Transfer: Set up           Functional mobility during ADLs: Supervision/safety       Vision   Additional Comments: pt able to accurately apply paste to the brush     Perception         Praxis         Pertinent Vitals/Pain Pain Assessment Pain Assessment: No/denies pain     Extremity/Trunk Assessment Upper Extremity Assessment Upper Extremity Assessment: Overall WFL for tasks assessed   Lower Extremity Assessment Lower Extremity Assessment: Defer to PT evaluation   Cervical / Trunk Assessment Cervical / Trunk Assessment: Normal   Communication Communication Communication: Impaired Factors Affecting Communication: Other (comment) (Has some difficulty expressing)   Cognition Arousal: Alert Behavior During Therapy: WFL for tasks assessed/performed Cognition: Cognition impaired  Awareness: Online awareness impaired       OT - Cognition Comments: pt takes tooth brush from OT and applies paste supine in the bed. pt needed cues to initiate sink level grooming like spitting the paste in the sink.                 Following commands: Intact       Cueing  General Comments   Cueing Techniques: Verbal cues;Gestural cues  BP 148/82 after activity   Exercises     Shoulder Instructions      Home Living Family/patient expects to be discharged to:: Private residence Living Arrangements: Spouse/significant other Available Help at  Discharge: Family Type of Home: House Home Access: Stairs to enter Secretary/administrator of Steps: 1 Entrance Stairs-Rails: Left Home Layout: Two level Alternate Level Stairs-Number of Steps: 13 Alternate Level Stairs-Rails: Left Bathroom Shower/Tub: Tub only;Tub/shower unit   Bathroom Toilet: Handicapped height Bathroom Accessibility: Yes   Home Equipment: Hand held shower head          Prior Functioning/Environment Prior Level of Function : Independent/Modified Independent;Working/employed;Driving;Patient poor historian/Family not available               ADLs Comments: Ind w/ ADLs/IADLs    OT Problem List: Decreased strength;Impaired balance (sitting and/or standing);Decreased activity tolerance;Decreased cognition;Decreased knowledge of use of DME or AE;Decreased knowledge of precautions   OT Treatment/Interventions: Self-care/ADL training;Neuromuscular education;Energy conservation;DME and/or AE instruction;Therapeutic activities;Cognitive remediation/compensation      OT Goals(Current goals can be found in the care plan section)   Acute Rehab OT Goals Patient Stated Goal: none stated OT Goal Formulation: With patient Time For Goal Achievement: 05/31/23 Potential to Achieve Goals: Good   OT Frequency:  Min 2X/week    Co-evaluation              AM-PAC OT "6 Clicks" Daily Activity     Outcome Measure Help from another person eating meals?: None Help from another person taking care of personal grooming?: A Little Help from another person toileting, which includes using toliet, bedpan, or urinal?: A Little Help from another person bathing (including washing, rinsing, drying)?: A Little Help from another person to put on and taking off regular upper body clothing?: A Little Help from another person to put on and taking off regular lower body clothing?: A Little 6 Click Score: 19   End of Session Nurse Communication: Mobility status  Activity  Tolerance: Patient tolerated treatment well Patient left: in bed;with call bell/phone within reach  OT Visit Diagnosis: Unsteadiness on feet (R26.81)                Time: 9147-8295 OT Time Calculation (min): 13 min Charges:  OT General Charges $OT Visit: 1 Visit OT Evaluation $OT Eval Low Complexity: 1 Low   Brynn, OTR/L  Acute Rehabilitation Services Office: [phone] .   Mateo Flow 05/17/2023, 5:31 PM

## 2023-05-17 NOTE — Plan of Care (Signed)
 Notified by RN of change in NIH with mildly increased word finding difficulty. Stat CT head ordered-no acute findings MRI brain completed-small volume left MCA cortical infarcts. Continue current care plan  -- Milon Dikes, MD Neurologist [facility]

## 2023-05-17 NOTE — TOC CAGE-AID Note (Signed)
 Transition of Care [facility]) - CAGE-AID Screening   Patient Details  Name: Kristopher Davis MRN: [id number] Date of Birth: December 20, 1955  [facility] CM/SW Contact:    Mearl Latin, LCSW Phone Number: 05/17/2023, 8:22 AM   Clinical Narrative: Patient is disoriented and unable to participate in screening at this time.    CAGE-AID Screening: Substance Abuse Screening unable to be completed due to: : Patient unable to participate

## 2023-05-17 NOTE — Progress Notes (Signed)
 Echocardiogram 2D Echocardiogram has been performed.  Lucendia Herrlich 05/17/2023, 1:59 PM

## 2023-05-17 NOTE — Plan of Care (Signed)
 Patient had neuro change overnight, Stroke/Neurology aware. No c/o headache or dizziness and still moves everything the same. At times patient can become irritable when participating in the NIH assessment. Patient states that he just wants to go home. Education provided on the importance of Neuro assessments and remaining in the ICU for now for close observation.  Wife at bedside and updated on plan of care. ICU status maintained.     Problem: Health Behavior/Discharge Planning: Goal: Goals will be collaboratively established with patient/family 05/17/2023 0604 by Charmian Muff, RN Outcome: Progressing    Problem: Ischemic Stroke/TIA Tissue Perfusion: Goal: Complications of ischemic stroke/TIA will be minimized 05/17/2023 0604 by Charmian Muff, RN Outcome: Not Progressing

## 2023-05-17 NOTE — Telephone Encounter (Signed)
 Patient Product/process development scientist completed.    The patient is insured through [employer]. Patient has Medicare and is not eligible for a copay card, but may be able to apply for patient assistance or Medicare RX Payment Plan (Patient Must reach out to their plan, if eligible for payment plan), if available.    Ran test claim for Eliquis 5 mg and the current 30 day co-pay is $297.00 due to a $250.00 deductible.  Will be $47.00 once deductible is met.   This test claim was processed through [facility]- copay amounts may vary at other pharmacies due to pharmacy/plan contracts, or as the patient moves through the different stages of their insurance plan.     Roland Earl, CPHT Pharmacy Technician III Certified Patient Advocate [facility] Patient Advocate Team Direct Number: [phone]  Fax: [phone]

## 2023-05-17 NOTE — Progress Notes (Signed)
 During the 2300 NIH assessment, patient noted to have trouble word finding. Patient unable to state month or year and provided incorrect age. Speech was notably slurred, making difficult to understand response when asked about orientation to place.   Dr. Wilford Corner Notified, and STAT head CT was ordered. Patient was also scheduled for routine MRI follow up, we proceeded with that as well.   Per Dr. Wilford Corner, MRI shows a small area of stroke which might be responsible for the fluctuating aphasia. No change in plan.    05/16/23 2255  NIH Stroke Scale   Dizziness Present No  Headache Present No  Interval (S)  Neuro change  Level of Consciousness (1a.)    1  LOC Questions (1b. )    2  LOC Commands (1c. )    0  Best Gaze (2. )   0  Visual (3. )   0  Facial Palsy (4. )     0  Motor Arm, Left (5a. )    0  Motor Arm, Right (5b. )  0  Motor Leg, Left (6a. )   0  Motor Leg, Right (6b. )  0  Limb Ataxia (7. ) 0  Sensory (8. )   0  Best Language (9. )   1  Dysarthria (10. ) 1  Extinction/Inattention (11.)    0  Complete NIHSS TOTAL 5   Signed,  Charmian Muff, RN  00:20 AM 05/17/2023

## 2023-05-17 NOTE — Progress Notes (Signed)
 Referring Physician(s): Dr. Otelia Limes  Supervising Physician: Julieanne Cotton  Patient Status:  Kristopher Davis - In-pt  Chief Complaint: R MCA infarct  Subjective: Patient sitting up in bed.   Able to answer all questions appropriately.   Easily frustrated with questioning, does not remember events of yesterday.  Asking about going home.  No family at bedside.   Allergies: Levofloxacin in d5w  Medications: Prior to Admission medications   Medication Sig Start Date End Date Taking? Authorizing Provider  amLODipine (NORVASC) 10 MG tablet Take 10 mg by mouth daily. 05/11/23   [provider]  amLODipine (NORVASC) 2.5 MG tablet Take 1 tablet (2.5 mg total) by mouth daily. 03/09/23   Alba Cory, MD  apixaban (ELIQUIS) 5 MG TABS tablet Take 1 tablet (5 mg total) by mouth 2 (two) times daily. 03/30/23   Alba Cory, MD  cyanocobalamin (VITAMIN B12) 1000 MCG tablet Take 1,000 mcg by mouth daily.    [provider]  folic acid (FOLVITE) 1 MG tablet Take 1 tablet (1 mg total) by mouth daily. Patient not taking: Reported on 03/14/2023 01/31/23   Alba Cory, MD  losartan (COZAAR) 100 MG tablet TAKE 1 TABLET(100 MG) BY MOUTH DAILY 02/27/23   Alba Cory, MD  tadalafil (CIALIS) 20 MG tablet Take 1 tablet (20 mg total) by mouth every other day as needed for erectile dysfunction. Take 1 tab 1 hour prior to intercourse 03/09/23   Alba Cory, MD  thiamine (VITAMIN B-1) 50 MG tablet Take 1 tablet (50 mg total) by mouth daily. 03/09/23   Alba Cory, MD     Vital Signs: BP (!) 141/91   Pulse 64   Temp 98.6 F (37 C) (Oral)   Resp 16   SpO2 97%   Physical Exam Vitals and nursing note reviewed.  Constitutional:      Appearance: Normal appearance.  Neurological:     Mental Status: He is alert.   Speech intelligible.  Some word finding difficulties, but ultimately answers all questions correctly.  Easily frustrated.  Groin soft.  No hematoma or  pseudoaneurysm.  Distal pulses intact.   Imaging: MR BRAIN WO CONTRAST Result Date: 05/17/2023 CLINICAL DATA:  Follow-up examination for stroke. EXAM: MRI HEAD WITHOUT CONTRAST TECHNIQUE: Multiplanar, multiecho pulse sequences of the brain and surrounding structures were obtained without intravenous contrast. COMPARISON:  CTs from 05/16/2023. FINDINGS: Brain: Cerebral volume within normal limits. Patchy T2/FLAIR hyperintensity involving the periventricular complete deep, and subcortical white matter both cerebral hemispheres, most likely related chronic microvascular ischemic disease, mild for age. Patchy small volume restricted diffusion involving the left insula, overlying left frontal lobe, and left temporal occipital region, consistent with acute left MCA distribution infarct. Small focus of associated susceptibility artifact at the left temporal occipital region consistent with mild petechial blood products (series 12, image 40). No frank hemorrhagic transformation or significant regional mass effect. No other evidence for acute or subacute ischemia. No other acute intracranial hemorrhage. Single chronic microhemorrhage noted within the right cerebellum. No mass lesion, midline shift or mass effect. No hydrocephalus or extra-axial fluid collection. Pituitary gland within normal limits. Vascular: Major intracranial vascular flow voids are maintained. Skull and upper cervical spine: Craniocervical junction within normal limits. Bone marrow signal intensity normal. No scalp soft tissue abnormality. Sinuses/Orbits: Globes orbital soft tissues within normal limits. Left maxillary sinus retention cyst noted. Paranasal sinuses are otherwise largely clear. Small left mastoid effusion noted, of doubtful significance. Other: None. IMPRESSION: 1. Patchy small volume acute left MCA distribution  infarct as above. Associated mild petechial blood products at the left temporoccipital region without frank hemorrhagic  transformation or significant regional mass effect. 2. Underlying mild chronic microvascular ischemic disease. Electronically Signed   By: Rise Mu M.D.   On: 05/17/2023 03:00   CT HEAD POST STROKE FOLLOWUP/TIMED/STAT READ Result Date: 05/16/2023 CLINICAL DATA:  Code stroke. Initial evaluation for acute neuro deficit, neuro change. EXAM: CT HEAD WITHOUT CONTRAST TECHNIQUE: Contiguous axial images were obtained from the base of the skull through the vertex without intravenous contrast. RADIATION DOSE REDUCTION: This exam was performed according to the departmental dose-optimization program which includes automated exposure control, adjustment of the mA and/or kV according to patient size and/or use of iterative reconstruction technique. COMPARISON:  Prior studies from earlier the same day. FINDINGS: Brain: Cerebral volume within normal limits. Mild chronic microvascular ischemic disease. No acute intracranial hemorrhage. No acute large vessel territory infarct. No mass lesion or midline shift. No hydrocephalus or extra-axial fluid collection. Vascular: No abnormal hyperdense vessel. Previously seen hyperdense left M2 is no longer visualized. Skull: Scalp soft tissues demonstrate no acute finding. Calvarium intact. Sinuses/Orbits: Globes orbital soft tissues within normal limits. Paranasal sinuses and mastoid air cells remain largely clear. Other: None. IMPRESSION: 1. No acute intracranial abnormality. 2. Mild chronic microvascular ischemic disease. Electronically Signed   By: Rise Mu M.D.   On: 05/16/2023 23:37   CT ANGIO HEAD NECK W WO CM W PERF (CODE STROKE) Result Date: 05/16/2023 CLINICAL DATA:  Code stroke. 68 year old male. Suspicious left MCA hyperdensity and clinical left MCA syndrome, last known well last night. EXAM: CT ANGIOGRAPHY HEAD AND NECK CT PERFUSION BRAIN TECHNIQUE: Multidetector CT imaging of the head and neck was performed using the standard protocol during bolus  administration of intravenous contrast. Multiplanar CT image reconstructions and MIPs were obtained to evaluate the vascular anatomy. Carotid stenosis measurements (when applicable) are obtained utilizing NASCET criteria, using the distal internal carotid diameter as the denominator. Multiphase CT imaging of the brain was performed following IV bolus contrast injection. Subsequent parametric perfusion maps were calculated using RAPID software. RADIATION DOSE REDUCTION: This exam was performed according to the departmental dose-optimization program which includes automated exposure control, adjustment of the mA and/or kV according to patient size and/or use of iterative reconstruction technique. CONTRAST:  OMNIPAQUE IOHEXOL 350 MG/ML SOLN COMPARISON:  Head CT 1148 hours today. FINDINGS: CT Brain Perfusion Findings: Initial attempt failed to process due to poor arterial input function. Fortunately, repeat attempt at 1227 hours is diagnostic. CBF (<30%) Volume: 0mL (5-9 mL using less transient parameters) Perfusion (Tmax>6.0s) volume: 40mL. With hypoperfusion index of 0.2, 7 mL T-max greater than 10 volume. Mismatch Volume: Approximately 40mL Infarction Location:Left MCA This was discussed by telephone with Dr. Ritta Slot on 05/16/2023 at 1235 hours. CTA NECK Skeleton: Absent dentition. Cervical spine degeneration. No acute osseous abnormality identified. Upper chest: Negative. Other neck: Leftward gaze is noted.  Otherwise within normal limits. Aortic arch: Suboptimal arterial contrast.  Normal 3 vessel arch. Right carotid system: Tortuosity of the proximal right CCA. Widely patent right carotid bifurcation with no plaque or stenosis identified. Left carotid system: A portion of the mid left CCA obscured by dense left jugular venous contrast reflux and streak. Patent left CCA left carotid bifurcation. Mostly calcified plaque at the left ICA origin without stenosis. Patent left ICA to the skull base.  Vertebral arteries: Suboptimal contrast bolus, codominant and within normal limits. CTA HEAD Posterior circulation: Suboptimal contrast bolus. Distal vertebral arteries,  vertebrobasilar junction, left PICA origin, dominant appearing right AICA origin, basilar artery, SCA and PCA origins appear patent and within normal limits. Posterior communicating arteries are diminutive or absent. Limited bilateral PCA branch detail, grossly patent. Anterior circulation: Both ICA siphons are patent. Detail limited due to suboptimal arterial bolus. Mild calcified plaque on the left. Patent carotid termini, MCA and ACA origins. Visible bilateral ACA branches are within normal limits. Right MCA M1 segment and bifurcation appear patent. Left MCA M1 segment appears patent but there is abrupt termination of enhancement just distal to the left MCA bifurcation on series 9, image 19 corresponding to the area of suspicious hyperdensity. Although bilateral MCA branch detail is limited due to the contrast bolus the constellation is consistent with left MCA M2 large vessel occlusion. Venous sinuses: Not well evaluated due to contrast bolus. Anatomic variants: None significant. Review of the MIP images confirms the above findings Preliminary report of left MCA M2 ELVO discussed by telephone with Dr. Ritta Slot on 05/16/2023 at 1204 hours. IMPRESSION: 1. Suboptimal CTA, and CTP which required repeating. But positive for Left MCA M2 ELVO at the bi/trifurcation, corresponding to suspicious CT hyperdensity. And repeat CTP indicates 40 mL of left MCA middle division territory penumbra with no significant infarct core. 2.  Study discussed by telephone with Dr. Ritta Slot. Electronically Signed   By: Odessa Fleming M.D.   On: 05/16/2023 12:39   CT HEAD CODE STROKE WO CONTRAST Result Date: 05/16/2023 CLINICAL DATA:  Code stroke.  68 year old male. EXAM: CT HEAD WITHOUT CONTRAST TECHNIQUE: Contiguous axial images were obtained from the  base of the skull through the vertex without intravenous contrast. RADIATION DOSE REDUCTION: This exam was performed according to the departmental dose-optimization program which includes automated exposure control, adjustment of the mA and/or kV according to patient size and/or use of iterative reconstruction technique. COMPARISON:  Head CT 09/16/2022. FINDINGS: Brain: Cavum septum pellucidum redemonstrated, normal variant. Stable cerebral volume. No midline shift, ventriculomegaly, mass effect, evidence of mass lesion, intracranial hemorrhage or evidence of cortically based acute infarction. Mild for age mostly periventricular white matter hypodensity appears stable along with gray-white differentiation in both hemispheres. Vascular: Asymmetric and suspicious hyperdensity of the left MCA bifurcation in the left sylvian fissure series 3, image 12. Skull: Stable, intact. Sinuses/Orbits: Visualized paranasal sinuses and mastoids are clear. Other: No acute orbit or scalp soft tissue finding identified. ASPECTS [facility] Stroke Program Early CT Score) Total score (0-10 with 10 being normal): 10 IMPRESSION: 1. Suspicious Left MCA hyperdensity in the sylvian fissure, but otherwise stable non contrast CT appearance of the brain since last year. ASPECTS 10. 2. These results were communicated to Dr. Amada Jupiter at 11:59 am on 05/16/2023 by text page via the [facility] messaging system. Electronically Signed   By: Odessa Fleming M.D.   On: 05/16/2023 11:59    Labs:  CBC: Recent Labs    09/16/22 0818 03/14/23 1432 05/16/23 1145 05/17/23 0509  WBC 7.9 5.5 7.2 4.5  HGB 15.3 13.6 13.7 12.8*  HCT 45.7 41.2 40.8 37.8*  PLT 197 241 234 201    COAGS: Recent Labs    09/16/22 1100 05/16/23 1145  INR  --  1.1  APTT >200* 30    BMP: Recent Labs    09/16/22 0818 03/14/23 1432 05/16/23 1145 05/17/23 0509  NA 138 138 140 137  K 4.1 4.0 3.7 3.2*  CL 105 106 107 106  CO2 19* 24 24 23   GLUCOSE 188* 95 85 72  BUN  10 10  13 8   CALCIUM 8.9 8.9 8.8* 7.9*  CREATININE 1.58* 1.21 1.12 1.18  GFRNONAA 48* >60 >60 >60    LIVER FUNCTION TESTS: Recent Labs    08/23/22 0914 09/16/22 0818 03/14/23 1432 05/16/23 1145  BILITOT 0.8 1.1 1.0 0.8  AST 22 22 19 23   ALT 14 15 13 12   ALKPHOS  --  52 44 38  PROT 6.9 7.1 7.0 7.0  ALBUMIN  --  3.7 3.9 3.6    Assessment and Plan: R MCA infarct s/p thrombectomy by Dr. Corliss Skains Patient sitting up in bed.  Conversing with well with only minimal speech hesitance. Easily frustrated with repetitive questions.   Moving all extremities.  Groin site stable.  Distal pulses intact.   Further care per Neuro team.   Electronically Signed: Hoyt Koch, PA 05/17/2023, 2:41 PM   I spent a total of 15 Minutes at the the patient's bedside AND on the patient's hospital floor or unit, greater than 50% of which was counseling/coordinating care for R MCA infarct.

## 2023-05-17 NOTE — Plan of Care (Signed)
   Problem: SLP Language Goals Goal: Patient will communicate needs/wants with Flowsheets (Taken 05/17/2023 0939) Patient will communicate ____  needs/wants with: modified independence   Problem: SLP Language Goals Goal: Patient will follow commands during a functional ADL with Flowsheets (Taken 05/17/2023 0939) Patient will follow ____ commands during a functional ADL with ____:  2 step  3 step  min assist

## 2023-05-17 NOTE — TOC Initial Note (Signed)
 Transition of Care [facility]) - Initial/Assessment Note    Patient Details  Name: Kristopher Davis MRN: [id number] Date of Birth: 08/25/1955  [facility]) CM/SW Contact:    Lamonte Sakai, Student-Social Work Phone Number: 05/17/2023, 10:13 AM  Clinical Narrative:                  Pt admnitted from home with spouse, undergoing stroke work up. TOC following for needs.       Patient Goals and CMS Choice            Expected Discharge Plan and Services       Living arrangements for the past 2 months: Single Family Home                                      Prior Living Arrangements/Services Living arrangements for the past 2 months: Single Family Home Lives with:: Spouse                   Activities of Daily Living      Permission Sought/Granted                  Emotional Assessment       Orientation: : Oriented to Self, Oriented to  Time Alcohol / Substance Use: Tobacco Use    Admission diagnosis:  Stroke (cerebrum) (HCC) [I63.9] Status post stroke [Z86.73] Stroke due to occlusion of left middle cerebral artery (HCC) [I63.512] Middle cerebral artery embolism, left [I66.02] Patient Active Problem List   Diagnosis Date Noted   Stroke (cerebrum) (HCC) 05/16/2023   Status post stroke 05/16/2023   Middle cerebral artery embolism, left 05/16/2023   Long term (current) use of anticoagulants 03/14/2023   PFO (patent foramen ovale) 09/16/2022   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 09/16/2022   History of pulmonary embolism 09/16/2022   B12 deficiency 07/13/2022   Adenomatous polyp of colon 02/08/2022   Cognitive dysfunction 08/19/2021   Vitamin B1 deficiency 08/19/2021   Folate deficiency 08/19/2021   Radiculitis of leg 08/19/2021   Erectile dysfunction 12/04/2019   Other hyperlipidemia 12/03/2019   Aortic atherosclerosis (HCC) 08/24/2017   Lipoma 10/28/2009   Disease of hair and hair follicles 07/28/2009    Benign essential HTN 01/07/2009   Decreased libido 02/01/2007   CD (contact dermatitis) 10/10/2006   PCP:  Alba Cory, MD Pharmacy:   [facility] [id number] [place],  - [address] AT [facility] OF [address] [place] [address] [place] [address] Phone: [phone] Fax: [phone]  [facility] [id number] [place], [place] - [address] [place] [address] Phone: [phone] Fax: [phone]     Social Drivers of Health (SDOH) Social History: SDOH Screenings   Food Insecurity: No Food Insecurity (05/16/2023)  Housing: Low Risk  (05/16/2023)  Transportation Needs: No Transportation Needs (05/16/2023)  Utilities: Not At Risk (05/16/2023)  Alcohol Screen: Low Risk  (03/07/2023)  Depression (PHQ2-9): Low Risk  (03/09/2023)  Financial Resource Strain: Low Risk  (03/07/2023)  Physical Activity: Sufficiently Active (03/07/2023)  Social Connections: Socially Integrated (05/16/2023)  Stress: No Stress Concern Present (03/07/2023)  Tobacco Use: High Risk (03/14/2023)   SDOH Interventions:     Readmission Risk Interventions     No data to display

## 2023-05-17 NOTE — Progress Notes (Addendum)
 STROKE TEAM PROGRESS NOTE    SIGNIFICANT HOSPITAL EVENTS  3/18: Transferred to [facility] for mechanical thrombectomy of left M2 occlusion  INTERIM HISTORY/SUBJECTIVE  Overnight mildly increased word finding difficulty was noted.  Stat CT head was completed with no acute findings.  MRI brain completed with small volume left MCA cortical infarcts. On exam this morning, patient has mild dysarthria and expressive aphasia with word finding difficulties.  Patient was previously on Eliquis from July last year through January of this year due to acute saddle PE.  He has not taken it since January.  Wife at bedside did note that Eliquis had become expensive for them on their new insurance.  He has no prior history of atrial fibrillation and was not on any other anticoagulation prior.  OBJECTIVE  CBC    Component Value Date/Time   WBC 4.5 05/17/2023 0509   RBC 4.17 (L) 05/17/2023 0509   HGB 12.8 (L) 05/17/2023 0509   HGB 14.8 07/13/2015 1043   HCT 37.8 (L) 05/17/2023 0509   HCT 44.1 07/13/2015 1043   PLT 201 05/17/2023 0509   PLT 225 07/13/2015 1043   MCV 90.6 05/17/2023 0509   MCV 91 07/13/2015 1043   MCH 30.7 05/17/2023 0509   MCHC 33.9 05/17/2023 0509   RDW 14.8 05/17/2023 0509   RDW 14.6 07/13/2015 1043   LYMPHSABS 1.2 05/17/2023 0509   LYMPHSABS 1.6 07/13/2015 1043   MONOABS 0.5 05/17/2023 0509   EOSABS 0.1 05/17/2023 0509   EOSABS 0.0 07/13/2015 1043   BASOSABS 0.0 05/17/2023 0509   BASOSABS 0.0 07/13/2015 1043    BMET    Component Value Date/Time   NA 137 05/17/2023 0509   NA 139 07/13/2015 1043   K 3.2 (L) 05/17/2023 0509   CL 106 05/17/2023 0509   CO2 23 05/17/2023 0509   GLUCOSE 72 05/17/2023 0509   BUN 8 05/17/2023 0509   BUN 9 07/13/2015 1043   CREATININE 1.18 05/17/2023 0509   CREATININE 1.23 08/23/2022 0914   CALCIUM 7.9 (L) 05/17/2023 0509   EGFR 65 08/23/2022 0914   GFRNONAA >60 05/17/2023 0509   GFRNONAA 68 05/06/2020 1431    IMAGING past 24  hours MR BRAIN WO CONTRAST Result Date: 05/17/2023 CLINICAL DATA:  Follow-up examination for stroke. EXAM: MRI HEAD WITHOUT CONTRAST TECHNIQUE: Multiplanar, multiecho pulse sequences of the brain and surrounding structures were obtained without intravenous contrast. COMPARISON:  CTs from 05/16/2023. FINDINGS: Brain: Cerebral volume within normal limits. Patchy T2/FLAIR hyperintensity involving the periventricular complete deep, and subcortical white matter both cerebral hemispheres, most likely related chronic microvascular ischemic disease, mild for age. Patchy small volume restricted diffusion involving the left insula, overlying left frontal lobe, and left temporal occipital region, consistent with acute left MCA distribution infarct. Small focus of associated susceptibility artifact at the left temporal occipital region consistent with mild petechial blood products (series 12, image 40). No frank hemorrhagic transformation or significant regional mass effect. No other evidence for acute or subacute ischemia. No other acute intracranial hemorrhage. Single chronic microhemorrhage noted within the right cerebellum. No mass lesion, midline shift or mass effect. No hydrocephalus or extra-axial fluid collection. Pituitary gland within normal limits. Vascular: Major intracranial vascular flow voids are maintained. Skull and upper cervical spine: Craniocervical junction within normal limits. Bone marrow signal intensity normal. No scalp soft tissue abnormality. Sinuses/Orbits: Globes orbital soft tissues within normal limits. Left maxillary sinus retention cyst noted. Paranasal sinuses are otherwise largely clear. Small left mastoid effusion noted, of doubtful significance.  Other: None. IMPRESSION: 1. Patchy small volume acute left MCA distribution infarct as above. Associated mild petechial blood products at the left temporoccipital region without frank hemorrhagic transformation or significant regional mass effect.  2. Underlying mild chronic microvascular ischemic disease. Electronically Signed   By: Rise Mu M.D.   On: 05/17/2023 03:00   CT HEAD POST STROKE FOLLOWUP/TIMED/STAT READ Result Date: 05/16/2023 CLINICAL DATA:  Code stroke. Initial evaluation for acute neuro deficit, neuro change. EXAM: CT HEAD WITHOUT CONTRAST TECHNIQUE: Contiguous axial images were obtained from the base of the skull through the vertex without intravenous contrast. RADIATION DOSE REDUCTION: This exam was performed according to the departmental dose-optimization program which includes automated exposure control, adjustment of the mA and/or kV according to patient size and/or use of iterative reconstruction technique. COMPARISON:  Prior studies from earlier the same day. FINDINGS: Brain: Cerebral volume within normal limits. Mild chronic microvascular ischemic disease. No acute intracranial hemorrhage. No acute large vessel territory infarct. No mass lesion or midline shift. No hydrocephalus or extra-axial fluid collection. Vascular: No abnormal hyperdense vessel. Previously seen hyperdense left M2 is no longer visualized. Skull: Scalp soft tissues demonstrate no acute finding. Calvarium intact. Sinuses/Orbits: Globes orbital soft tissues within normal limits. Paranasal sinuses and mastoid air cells remain largely clear. Other: None. IMPRESSION: 1. No acute intracranial abnormality. 2. Mild chronic microvascular ischemic disease. Electronically Signed   By: Rise Mu M.D.   On: 05/16/2023 23:37    Vitals:   05/17/23 2440 05/17/23 0945 05/17/23 1034 05/17/23 1300  BP: (!) 142/82 (!) 142/81 124/72 (!) 141/91  Pulse: 72 83  64  Resp: 17 13  16   Temp:      TempSrc:      SpO2: 96% 96%  97%     PHYSICAL EXAM General:  Alert, well-nourished, well-developed patient in no acute distress Psych:  Mood and affect appropriate for situation CV: Regular rate and rhythm on monitor Respiratory:  Regular, unlabored  respirations on room air GI: Abdomen soft and nontender   NEURO:  Mental Status: alert and oriented.patient is able to give clear and coherent history.  Patient is unable to say what month or year when asked orientation questions, likely due to expressive aphasia not confusion. Speech/Language: Mild dysarthria, expressive aphasia present with mild word finding difficulties.  Cranial Nerves:  II: PERRL. Visual fields full.  III, IV, VI: EOMI. Eyelids elevate symmetrically. Tracks examiner fully.  V: Sensation is intact to light touch and symmetrical to face.  VII: Face is symmetrical resting and smiling VIII: hearing intact to voice. IX, X: normal phonation. No hoarseness. NU:[id number] shrug 5/5. XII: tongue is midline  Motor: 5/5 strength to all muscle groups tested. No drifts Tone: is normal and bulk is normal Sensation- Intact to light touch bilaterally. Extinction absent to light touch to DSS.   Coordination: FTN intact bilaterally, HKS: no ataxia in BLE.No drift.  Gait- deferred  Most Recent NIH: 5     ASSESSMENT/PLAN  Mr. Leeland Lovelady is a 68 y.o. male with history of hypertension, PE (on Eliquis from July 2024 through January 2025, who who presented to [facility] as an EMS-activated code stroke due to difficulty speaking.  CTA showed proximal left M2 occlusion.  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 Recurrent DVT in the setting of PFO  Code Stroke CT head  Left MCA hyperdensity. ASPECTS 10 CTA head & neck Left MCA M2 LVO CT perfusion 0/40 cc S/p IR of left M2 occlusion with TICI3  Post IR CT: no hemorrhage MRI  Small volume acute left MCA infarct, associated mild petechial blood products at left temporal occipital region without frank hemorrhagic transformation 2D Echo EF 60-65% VAS [place] LE venous doppler no  DVT LDL 64 HgbA1c 4.5 UDS neg VTE prophylaxis - lovenox No antithrombotic prior to admission, now on aspirin 81 mg daily and clopidogrel 75 mg daily. Given previous unprovoked PE and DVT, positive PFO, and embolic stroke this time, will resume Eliquis tomorrow Therapy recommendations:  No follow up needed  Disposition:  likely home soon  Hx of PE and left LE DVT 08/2022 pt admitted to [facility] for dyspnea and then syncopy, found to have acute saddle PE with Cor Pulmonale and the PE acrossing the PFO.  S/p Mechanical thrombectomy with removal of clots from main, left and right PA. Left DVT seen on Venous Duplex 08/2022 [facility]) Unprovoked   Eliquis 5 mg twice daily July 2024 through January 2025   PFO 08/2022 CTA chest in [facility] performed which revealed acute bilateral pulmonary emboli along with evidence of acute right heart strain with RV: LV ratio of approximately 3. A linear hypodensity was noted that appeared to cross the interatrial septum and there was concern for a clot in transit across a PFO.  Given previous unprovoked PE and DVT, positive PFO, and embolic stroke this time, will resume Eliquis tomorrow  Hypertension Home meds: Amlodipine, losartan--restart today Stable Wean off Cleviprex BP goal < 180 Long term BP goal normotensive  Hyperlipidemia Home meds:  none LDL 64, goal < 70 High intensity statin not indicated due to LDL within goal  Tobacco Abuse Patient is a current smoker       Ready to quit? Yes Nicotine replacement therapy provided  Other Stroke Risk Factors ETOH use, alcohol level 89, advised to drink no more than 1-2 drink(s) a day Obesity, BMI 33.84  BMI >/= 30 associated with increased stroke risk, recommend weight loss, diet and exercise as appropriate   Other acute issues Cognitive impairment, sundowning reported in 08/2022 while hospitalized in [facility] day # 1   Pt seen by Neuro NP/APP and later by MD. Note/plan to be edited by MD as needed.    Lynnae January, DNP, AGACNP-BC [facility] Please use AMION for contact information & EPIC for messaging.  ATTENDING NOTE: I reviewed above note and agree with the assessment and plan. Pt was seen and examined.   Wife at bedside.  Patient sitting in chair, still express aphasia with word finding difficulty and hesitancy of speech and frequent paraphasic errors.  Follows some commands, naming 2/4, difficulty repeating simple sentence.  However, no gaze palsy, visual field full, facial symmetrical, moving all extremities symmetrically.  Patient had DVT and PE in 08/2022 and CTA chest at that time showed thrombus crossing PFO.  This admission patient had embolic stroke at left M2, although DVT negative, question stroke related to recurrent DVT in the setting of PFO.  Previous DVT and PE also unprovoked, had Eliquis from 08/2022 to 03/2023.  Will plan to resume Eliquis tomorrow.  Continue DAPT today.  LDL at goal, no statin at this time.  PT and OT no recommendation.  Patient will need outpatient speech therapy.  For detailed assessment and plan, please refer to above/below as I have made changes wherever appropriate.   Lochlin Eppinger  Roda Shutters, MD PhD Stroke Neurology 05/17/2023 5:11 PM  This patient is critically ill due to left MCA stroke status post IR, history of PE and DVT and at significant risk of neurological worsening, death form recurrent stroke, hemorrhagic conversion, DVT or PE. This patient's care requires constant monitoring of vital signs, hemodynamics, respiratory and cardiac monitoring, review of multiple databases, neurological assessment, discussion with family, other specialists and medical decision making of high complexity. I spent 45 minutes of neurocritical care time in the care of this patient. I had long discussion with wife at bedside, updated pt current condition, treatment plan and potential prognosis, and answered all the questions.  She expressed understanding and appreciation.       To contact Stroke Continuity provider, please refer to [email or web address]. After hours, contact General Neurology

## 2023-05-18 ENCOUNTER — Encounter (HOSPITAL_COMMUNITY): Payer: Self-pay | Admitting: Neurology

## 2023-05-18 ENCOUNTER — Other Ambulatory Visit (HOSPITAL_COMMUNITY): Payer: Self-pay

## 2023-05-18 DIAGNOSIS — I63412 Cerebral infarction due to embolism of left middle cerebral artery: Secondary | ICD-10-CM | POA: Diagnosis not present

## 2023-05-18 MED ORDER — APIXABAN 5 MG PO TABS
5.0000 mg | ORAL_TABLET | Freq: Two times a day (BID) | ORAL | Status: DC
  Administered 2023-05-18 – 2023-05-19 (×2): 5 mg via ORAL
  Filled 2023-05-18 (×2): qty 1

## 2023-05-18 NOTE — Progress Notes (Signed)
 Physical Therapy Treatment Patient Details Name: Kristopher Davis MRN: [id number] DOB: 22-Oct-1955 Today's Date: 05/18/2023   History of Present Illness 68 y/o male presents to [facility] on 3/18 with trouble speaking that started in the morning. Radiology report findings suggest an occluded mid frontal branch of the superior division of the right middle cerebral artery. MRI findings suggest patchy small volume acute left MCA distribution infarct. S/p L common carotid arteriogram and  revascularization of the occluded branch of the superior division of the right middle cerebral artery on 3/18. PMH includes: pulmonary embolism and HTN.    PT Comments  Patient resting in bed with spouse present, agreeable to mobilize with therapy but overall expressed frustration at hospital admission. Pt completed bed mobility and transfers at Mod Ind level with no AD. Pt amb ~350' today with no AD and supervision only, no LOB noted with dual task or dynamic challenges with DGI. Patient scored 22/4 indicating he is a safe ambulator (A score of </= 19/24 is predictive of falls in the elderly, but a score of > 22/24 is indicative of safe ambulators.) EOS time spent educating patient and spouse on BEFAST and on lifestyle modifications to reduce risk of further strokes; pt identified smoking, alcohol use, and salt intake as areas to address. Pt also enjoys regular walking for exercise and hopes to begin this as the weather improves. Pt's greatest difficulty is with expressing self and finding correct works. Anticipate no PT follow up needs but pt will likely benefit from continues OP SLP. Pt is mobilizing at safe level to work with RN/NT staff and amb with family. No further acute PT needs at this time. Acute PT will sign off.    If plan is discharge home, recommend the following: Assist for transportation;Supervision due to cognitive status   Can travel by private vehicle        Equipment Recommendations  None recommended by PT     Recommendations for Other Services       Precautions / Restrictions Precautions Precautions: Other (comment) Recall of Precautions/Restrictions: Intact Precaution/Restrictions Comments: SBP 120-140 Restrictions Weight Bearing Restrictions Per Provider Order: No     Mobility  Bed Mobility Overal bed mobility: Independent                  Transfers Overall transfer level: Modified independent   Transfers: Sit to/from Stand Sit to Stand: Modified independent (Device/Increase time)           General transfer comment: use of hands to rise and lower    Ambulation/Gait Ambulation/Gait assistance: Supervision Gait Distance (Feet): 350 Feet Assistive device: None   Gait velocity: fair     General Gait Details: overall steady, no LOB throughout during walk&talk and DGI testing.   Stairs Stairs: Yes Stairs assistance: Supervision Stair Management: One rail Right, Alternating pattern, Forwards Number of Stairs: 6 General stair comments: no overt LOB, pt moving quickly and then slowed down when cued by therapist.   Wheelchair Mobility     Tilt Bed    Modified Rankin (Stroke Patients Only)       Balance Overall balance assessment: Mild deficits observed, not formally tested                               Standardized Balance Assessment Standardized Balance Assessment : Dynamic Gait Index   Dynamic Gait Index Level Surface: Normal Change in Gait Speed: Normal Gait with Horizontal Head Turns: Normal  Gait with Vertical Head Turns: Normal Gait and Pivot Turn: Normal Step Over Obstacle: Normal Step Around Obstacles: Mild Impairment Steps: Mild Impairment Total Score: 22      Communication Communication Communication: Impaired Factors Affecting Communication: Difficulty expressing self  Cognition Arousal: Alert Behavior During Therapy: WFL for tasks assessed/performed (frustrated about being in hospital)     Difficult to assess  due to: Impaired communication                     PT - Cognition Comments: A&O x4 - impaired communication due to aphasia. pt taking extra time to answer and not fully able to answer questions for teach back with stroke education. Following commands: Intact      Cueing Cueing Techniques: Verbal cues  Exercises      General Comments        Pertinent Vitals/Pain Pain Assessment Pain Assessment: No/denies pain    Home Living   Living Arrangements: Spouse/significant other                      Prior Function            PT Goals (current goals can now be found in the care plan section) Acute Rehab PT Goals Patient Stated Goal: Return to PLOF PT Goal Formulation: With patient Time For Goal Achievement: 05/31/23 Potential to Achieve Goals: Good Progress towards PT goals: Progressing toward goals;Goals met/education completed, patient discharged from PT    Frequency    Min 2X/week      PT Plan      Co-evaluation              AM-PAC PT "6 Clicks" Mobility   Outcome Measure  Help needed turning from your back to your side while in a flat bed without using bedrails?: None Help needed moving from lying on your back to sitting on the side of a flat bed without using bedrails?: None Help needed moving to and from a bed to a chair (including a wheelchair)?: None Help needed standing up from a chair using your arms (e.g., wheelchair or bedside chair)?: None Help needed to walk in hospital room?: A Little Help needed climbing 3-5 steps with a railing? : A Little 6 Click Score: 22    End of Session Equipment Utilized During Treatment: Gait belt Activity Tolerance: Patient tolerated treatment well Patient left: in bed;with call bell/phone within reach;with family/visitor present;with bed alarm set Nurse Communication: Mobility status;Other (comment) PT Visit Diagnosis: Other symptoms and signs involving the nervous system (Z61.096)     Time:  0454-0981 PT Time Calculation (min) (ACUTE ONLY): 22 min  Charges:    $Gait Training: 8-22 mins PT General Charges $$ ACUTE PT VISIT: 1 Visit                     Wynn Maudlin, DPT Acute Rehabilitation Services Office [phone]  05/18/23 10:27 AM

## 2023-05-18 NOTE — TOC Transition Note (Signed)
 Transition of Care [facility]) - Discharge Note   Patient Details  Name: Kristopher Davis MRN: [id number] Date of Birth: 04-13-1955  [facility] of [facility]) CM/SW Contact:  Kermit Balo, RN Phone Number: 05/18/2023, 11:05 AM   Clinical Narrative:     Pt is from home with his spouse. Spouse can provide needed transportation. Pt states he takes his BP meds as ordered but quit taking Eliquis due to the co pay cost.  No DME at home.  Outpatient therapy arranged through [facility] outpt rehab. Information on the AVS.  Wife will transport home.  Final next level of care: OP Rehab Barriers to Discharge: No Barriers Identified   Patient Goals and CMS Choice     Choice offered to / list presented to : Patient      Discharge Placement                       Discharge Plan and Services Additional resources added to the After Visit Summary for                                       Social Drivers of Health (SDOH) Interventions SDOH Screenings   Food Insecurity: No Food Insecurity (05/16/2023)  Housing: Low Risk  (05/16/2023)  Transportation Needs: No Transportation Needs (05/16/2023)  Utilities: Not At Risk (05/16/2023)  Alcohol Screen: Low Risk  (03/07/2023)  Depression (PHQ2-9): Low Risk  (03/09/2023)  Financial Resource Strain: Low Risk  (03/07/2023)  Physical Activity: Sufficiently Active (03/07/2023)  Social Connections: Socially Integrated (05/16/2023)  Stress: No Stress Concern Present (03/07/2023)  Tobacco Use: High Risk (05/18/2023)     Readmission Risk Interventions     No data to display

## 2023-05-18 NOTE — Progress Notes (Signed)
 STROKE TEAM PROGRESS NOTE    SIGNIFICANT HOSPITAL EVENTS  3/18: Transferred to [facility] for mechanical thrombectomy of left M2 occlusion  INTERIM HISTORY/SUBJECTIVE  No acute events overnight, neuro exam stable with expressive aphasia and word-finding difficulty, hesitancy and paraphasic errors.   Patient will start Eliquis tonight. Likely discharge home tomorrow.   OBJECTIVE  CBC    Component Value Date/Time   WBC 4.5 05/17/2023 0509   RBC 4.17 (L) 05/17/2023 0509   HGB 12.8 (L) 05/17/2023 0509   HGB 14.8 07/13/2015 1043   HCT 37.8 (L) 05/17/2023 0509   HCT 44.1 07/13/2015 1043   PLT 201 05/17/2023 0509   PLT 225 07/13/2015 1043   MCV 90.6 05/17/2023 0509   MCV 91 07/13/2015 1043   MCH 30.7 05/17/2023 0509   MCHC 33.9 05/17/2023 0509   RDW 14.8 05/17/2023 0509   RDW 14.6 07/13/2015 1043   LYMPHSABS 1.2 05/17/2023 0509   LYMPHSABS 1.6 07/13/2015 1043   MONOABS 0.5 05/17/2023 0509   EOSABS 0.1 05/17/2023 0509   EOSABS 0.0 07/13/2015 1043   BASOSABS 0.0 05/17/2023 0509   BASOSABS 0.0 07/13/2015 1043    BMET    Component Value Date/Time   NA 137 05/17/2023 0509   NA 139 07/13/2015 1043   K 3.2 (L) 05/17/2023 0509   CL 106 05/17/2023 0509   CO2 23 05/17/2023 0509   GLUCOSE 72 05/17/2023 0509   BUN 8 05/17/2023 0509   BUN 9 07/13/2015 1043   CREATININE 1.18 05/17/2023 0509   CREATININE 1.23 08/23/2022 0914   CALCIUM 7.9 (L) 05/17/2023 0509   EGFR 65 08/23/2022 0914   GFRNONAA >60 05/17/2023 0509   GFRNONAA 68 05/06/2020 1431    IMAGING past 24 hours VAS [place] LOWER EXTREMITY VENOUS (DVT) Result Date: 05/18/2023  Lower Venous DVT Study Patient Name:  KAELUM KISSICK  Date of Exam:   05/17/2023 Medical Rec #: [id number]    Accession #:    [id number] Date of Birth: 1955/07/29    Patient Gender: M Patient Age:   68 years Exam Location:  [facility] Procedure:      VAS [place] LOWER EXTREMITY VENOUS (DVT) Referring Phys: Scheryl Marten Jesseka Drinkard  --------------------------------------------------------------------------------  Indications: Stroke. Other Indications: History of PE on eliquis. Comparison Study: No priors. Performing Technologist: [place] Sink Stur[place] RDMS, RVT  Examination Guidelines: A complete evaluation includes B-mode imaging, spectral Doppler, color Doppler, and power Doppler as needed of all accessible portions of each vessel. Bilateral testing is considered an integral part of a complete examination. Limited examinations for reoccurring indications may be performed as noted. The reflux portion of the exam is performed with the patient in reverse Trendelenburg.  +---------+---------------+---------+-----------+----------+--------------+ RIGHT    CompressibilityPhasicitySpontaneityPropertiesThrombus Aging +---------+---------------+---------+-----------+----------+--------------+ CFV      Full           Yes      Yes                                 +---------+---------------+---------+-----------+----------+--------------+ SFJ      Full                                                        +---------+---------------+---------+-----------+----------+--------------+ FV Prox  Full                                                        +---------+---------------+---------+-----------+----------+--------------+  FV Mid   Full                                                        +---------+---------------+---------+-----------+----------+--------------+ FV DistalFull                                                        +---------+---------------+---------+-----------+----------+--------------+ PFV      Full                                                        +---------+---------------+---------+-----------+----------+--------------+ POP      Full           Yes      Yes                                 +---------+---------------+---------+-----------+----------+--------------+ PTV       Full                                                        +---------+---------------+---------+-----------+----------+--------------+ PERO     Full                                                        +---------+---------------+---------+-----------+----------+--------------+   +---------+---------------+---------+-----------+----------+--------------+ LEFT     CompressibilityPhasicitySpontaneityPropertiesThrombus Aging +---------+---------------+---------+-----------+----------+--------------+ CFV      Full           Yes      Yes                                 +---------+---------------+---------+-----------+----------+--------------+ SFJ      Full                                                        +---------+---------------+---------+-----------+----------+--------------+ FV Prox  Full                                                        +---------+---------------+---------+-----------+----------+--------------+ FV Mid   Full                                                        +---------+---------------+---------+-----------+----------+--------------+  FV DistalFull                                                        +---------+---------------+---------+-----------+----------+--------------+ PFV      Full                                                        +---------+---------------+---------+-----------+----------+--------------+ POP      Full           Yes      Yes                                 +---------+---------------+---------+-----------+----------+--------------+ PTV      Full                                                        +---------+---------------+---------+-----------+----------+--------------+ PERO     Full                                                        +---------+---------------+---------+-----------+----------+--------------+     Summary: BILATERAL: - No evidence of deep vein  thrombosis seen in the lower extremities, bilaterally. -No evidence of popliteal cyst, bilaterally.   *See table(s) above for measurements and observations. Electronically signed by Coral Else MD on 05/18/2023 at 7:40:24 AM.    Final     Vitals:   05/18/23 0015 05/18/23 0411 05/18/23 0745 05/18/23 1225  BP: 136/79 (!) 142/81 (!) 144/73 139/86  Pulse: 65 60 69 67  Resp:   20   Temp: 98.3 F (36.8 C) 98.8 F (37.1 C) 97.7 F (36.5 C) 98.5 F (36.9 C)  TempSrc: Oral Oral Oral Oral  SpO2: 100% 100% 100% 100%  Weight:      Height:       PHYSICAL EXAM General:  Alert, well-nourished, well-developed patient in no acute distress Psych:  Mood and affect appropriate for situation CV: Regular rate and rhythm on monitor Respiratory:  Regular, unlabored respirations on room air GI: Abdomen soft and nontender  NEURO:  Mental Status: Alert, Oriented.  Speech/Language: Mild dysarthria, expressive aphasia present with mild word finding difficulties. 3/4 naming. Repetition of simple sentences intact with dysarthria.   Cranial Nerves:  II: PERRL. Visual fields full.  III, IV, VI: EOMI. Eyelids elevate symmetrically. Tracks examiner fully.  V: Sensation is intact to light touch and symmetrical to face.  VII: Face is symmetrical resting and smiling VIII: hearing intact to voice. IX, X: normal phonation. No hoarseness. NW:GNFAOZHY shrug 5/5. XII: tongue is midline  Motor: 5/5 strength to all muscle groups tested. No drifts Tone: is normal and bulk is normal Sensation- Intact to light touch bilaterally. Extinction absent to light touch to DSS.   Coordination: FTN intact bilaterally, HKS: no ataxia in  BLE.No drift.  Gait- deferred  Most Recent NIH: 5   ASSESSMENT/PLAN  Mr. Millan Legan is a 68 y.o. male with history of hypertension, PE (on Eliquis from July 2024 through January 2025, who who presented to [facility] as an EMS-activated code stroke due to difficulty speaking.  CTA showed proximal  left M2 occlusion.  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 recurrent DVT in the setting of PFO  Code Stroke CT head Left MCA hyperdensity. ASPECTS 10 CTA head & neck Left MCA M2 LVO CT perfusion 0/40 cc S/p IR of left M2 occlusion with TICI3  Post IR CT: no hemorrhage MRI  Small volume acute left MCA infarct, associated mild petechial blood products at left temporal occipital region without frank hemorrhagic transformation 2D Echo EF 60-65% VAS [place] LE venous doppler no DVT LDL 64 HgbA1c 4.5 UDS neg VTE prophylaxis - lovenox No antithrombotic prior to admission, now on aspirin 81 mg daily and clopidogrel 75 mg daily. Given previous unprovoked PE and DVT, positive PFO, and embolic stroke this time, will resume Eliquis Therapy recommendations:  No follow up needed  Disposition:  Plan to DC home tomorrow, will need outpatient ST  Hx of PE and left LE DVT 08/2022 pt admitted to [facility] for dyspnea and then syncopy, found to have acute saddle PE with Cor Pulmonale and the PE acrossing the PFO.  S/p Mechanical thrombectomy with removal of clots from main, left and right PA. Left DVT seen on Venous Duplex 08/2022 [facility]) Unprovoked   Eliquis 5 mg twice daily July 2024 through January 2025  Will restart Eliquis tonight  PFO 08/2022 CTA chest in [facility] performed which revealed acute bilateral pulmonary emboli along with evidence of acute right heart strain with RV: LV ratio of approximately 3. A linear hypodensity was noted that appeared to cross the interatrial septum and there was concern for a clot in transit across a PFO.  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 Yes Nicotine replacement therapy provided  Other Stroke Risk Factors ETOH use, alcohol level 89, advised to drink no more than 1-2 drink(s) a day Obesity, BMI 33.84  BMI >/= 30 associated with increased stroke risk, recommend weight loss, diet and exercise as appropriate   Other acute issues Cognitive impairment, sundowning reported in 08/2022 while hospitalized in [facility] day # 2   Pt seen by Neuro NP/APP and later by MD. Note/plan to be edited by MD as needed.    Lynnae January, DNP, AGACNP-BC [facility] Please use AMION for contact information & EPIC for messaging.  ATTENDING NOTE: I reviewed above note and agree with the assessment and plan. Pt was seen and examined.   Wife and friend are at bedside.  Patient sitting in chair, speech much improved, more fluent, although intermittent hesitancy, able to name and repeat, follows some commands.  Moving all extremities.  Plan to start Eliquis tonight.  For detailed assessment and plan,  please refer to above/below as I have made changes wherever appropriate.   Marvel Plan, MD PhD Stroke Neurology 05/18/2023 7:22 PM

## 2023-05-18 NOTE — Plan of Care (Addendum)
 Resting in bed with friend at his side.  Earlier today was upset and demanding this nurse call the doctor to see what time he was coming to see pt.  Was informed that we do not call MD to see what time they are coming if not an emergency so we do not pull them away from other patients.  At first he was upset about it but when wife and friend helped explain this to him, he did state understanding.  Problem: Education: Goal: Knowledge of secondary prevention will improve (MUST DOCUMENT ALL) Outcome: Progressing   Problem: Education: Goal: Knowledge of patient specific risk factors will improve (DELETE if not current risk factor) Outcome: Progressing   Problem: Education: Goal: Knowledge of General Education information will improve Description: Including pain rating scale, medication(s)/side effects and non-pharmacologic comfort measures Outcome: Progressing

## 2023-05-18 NOTE — Progress Notes (Signed)
 PHARMACY - ANTICOAGULATION CONSULT NOTE  Pharmacy Consult for apixaban Indication: atrial fibrillation/PE/DVT  Allergies  Allergen Reactions   Levofloxacin In D5w     hallucinations    Patient Measurements: Height: 6\' 4"  (193 cm) Weight: 126.1 kg (278 lb) IBW/kg (Calculated) : 86.8 Heparin Dosing Weight:   Vital Signs: Temp: 97.7 F (36.5 C) (03/20 0745) Temp Source: Oral (03/20 0745) BP: 144/73 (03/20 0745) Pulse Rate: 69 (03/20 0745)  Labs: Recent Labs    05/16/23 1145 05/17/23 0509  HGB 13.7 12.8*  HCT 40.8 37.8*  PLT 234 201  APTT 30  --   LABPROT 13.9  --   INR 1.1  --   CREATININE 1.12 1.18    Estimated Creatinine Clearance: 88.1 mL/min (by C-G formula based on SCr of 1.18 mg/dL).   Medical History: Past Medical History:  Diagnosis Date   Decreased libido 02/01/2007   Hypertension     Medications:  Medications Prior to Admission  Medication Sig Dispense Refill Last Dose/Taking   amLODipine (NORVASC) 10 MG tablet Take 10 mg by mouth daily.   05/16/2023   cyanocobalamin (VITAMIN B12) 1000 MCG tablet Take 1,000 mcg by mouth daily.   05/16/2023   losartan (COZAAR) 100 MG tablet TAKE 1 TABLET(100 MG) BY MOUTH DAILY 90 tablet 0 05/16/2023   tadalafil (CIALIS) 20 MG tablet Take 1 tablet (20 mg total) by mouth every other day as needed for erectile dysfunction. Take 1 tab 1 hour prior to intercourse 6 tablet 2 Taking As Needed   amLODipine (NORVASC) 2.5 MG tablet Take 1 tablet (2.5 mg total) by mouth daily. (Patient not taking: Reported on 05/17/2023) 90 tablet 0 Not Taking   apixaban (ELIQUIS) 5 MG TABS tablet Take 1 tablet (5 mg total) by mouth 2 (two) times daily. (Patient not taking: Reported on 05/17/2023) 60 tablet 0 Not Taking   thiamine (VITAMIN B-1) 50 MG tablet Take 1 tablet (50 mg total) by mouth daily. (Patient not taking: Reported on 05/17/2023) 100 tablet 1 Not Taking   Scheduled:   amLODipine  2.5 mg Oral Daily   enoxaparin (LOVENOX) injection  60  mg Subcutaneous Q24H   losartan  100 mg Oral Daily   nicotine  7 mg Transdermal Daily   Infusions:   Assessment: Pt was previously on apixaban for his hx of DVT/PE. Presented with CVA due to off of anticoagulation. Neuro would like to resume apixaban.   Copay $297 x1 then $47 after meeting deductible.  Goal of Therapy:  Monitor platelets by anticoagulation protocol: Yes   Plan:  Apixaban 5mg  PO BID Rx will monitor peripherally  Ulyses Southward, PharmD, BCIDP, AAHIVP, CPP Infectious Disease Pharmacist 05/18/2023 11:45 AM

## 2023-05-19 ENCOUNTER — Other Ambulatory Visit (HOSPITAL_COMMUNITY): Payer: Self-pay

## 2023-05-19 DIAGNOSIS — F17200 Nicotine dependence, unspecified, uncomplicated: Secondary | ICD-10-CM | POA: Diagnosis present

## 2023-05-19 DIAGNOSIS — F1010 Alcohol abuse, uncomplicated: Secondary | ICD-10-CM | POA: Diagnosis present

## 2023-05-19 DIAGNOSIS — I6602 Occlusion and stenosis of left middle cerebral artery: Secondary | ICD-10-CM

## 2023-05-19 DIAGNOSIS — Z86718 Personal history of other venous thrombosis and embolism: Secondary | ICD-10-CM

## 2023-05-19 MED ORDER — LOSARTAN POTASSIUM 100 MG PO TABS
100.0000 mg | ORAL_TABLET | Freq: Every day | ORAL | 2 refills | Status: DC
  Filled 2023-05-19: qty 30, 30d supply, fill #0

## 2023-05-19 MED ORDER — NICOTINE 7 MG/24HR TD PT24
7.0000 mg | MEDICATED_PATCH | Freq: Every day | TRANSDERMAL | 0 refills | Status: DC
Start: 2023-05-20 — End: 2023-07-19
  Filled 2023-05-19 (×2): qty 28, 28d supply, fill #0

## 2023-05-19 MED ORDER — APIXABAN 5 MG PO TABS
5.0000 mg | ORAL_TABLET | Freq: Two times a day (BID) | ORAL | 2 refills | Status: DC
  Filled 2023-05-19: qty 60, 30d supply, fill #0

## 2023-05-19 NOTE — Progress Notes (Signed)
 Occupational Therapy Treatment Patient Details Name: Kristopher Davis MRN: [id number] DOB: 29-Apr-1955 Today's Date: 05/19/2023   History of present illness 68 y/o male presents to [facility] on 3/18 with trouble speaking that started in the morning. Radiology report findings suggest an occluded mid frontal branch of the superior division of the right middle cerebral artery. MRI findings suggest patchy small volume acute left MCA distribution infarct. S/p L common carotid arteriogram and  revascularization of the occluded branch of the superior division of the right middle cerebral artery on 3/18. PMH includes: pulmonary embolism and HTN.   OT comments  Patient independently dressed lower body and ambulated throughout room.  No balance deficits noted with adls and no UE issues noted.  Patient, wife and OT agree that no follow up OT is indicated and no equipment needed for discharge.  Will sign off       If plan is discharge home, recommend the following:  Direct supervision/assist for medications management;Supervision due to cognitive status   Equipment Recommendations  None recommended by OT    Recommendations for Other Services      Precautions / Restrictions Precautions Precautions: Fall Recall of Precautions/Restrictions: Intact Restrictions Weight Bearing Restrictions Per Provider Order: No       Mobility Bed Mobility Overal bed mobility: Independent                  Transfers                         Balance Overall balance assessment: No apparent balance deficits (not formally assessed)                                         ADL either performed or assessed with clinical judgement   ADL Overall ADL's : Independent                                            Extremity/Trunk Assessment Upper Extremity Assessment Upper Extremity Assessment: Overall WFL for tasks assessed   Lower Extremity Assessment Lower Extremity  Assessment: Overall WFL for tasks assessed        Vision   Vision Assessment?: No apparent visual deficits   Perception     Praxis     Communication Communication Communication: Impaired Factors Affecting Communication: Difficulty expressing self   Cognition Arousal: Alert Behavior During Therapy: WFL for tasks assessed/performed               OT - Cognition Comments: following commands for OT, focused on going home                 Following commands: Intact        Cueing      Exercises      Shoulder Instructions       General Comments Wife present and stating patient does not need any further OT    Pertinent Vitals/ Pain       Pain Assessment Pain Assessment: No/denies pain  Home Living                                          Prior Functioning/Environment  Frequency  Min 2X/week        Progress Toward Goals  OT Goals(current goals can now be found in the care plan section)  Progress towards OT goals: Goals met/education completed, patient discharged from OT     Plan      Co-evaluation                 AM-PAC OT "6 Clicks" Daily Activity     Outcome Measure   Help from another person eating meals?: None Help from another person taking care of personal grooming?: None Help from another person toileting, which includes using toliet, bedpan, or urinal?: None Help from another person bathing (including washing, rinsing, drying)?: None Help from another person to put on and taking off regular upper body clothing?: None Help from another person to put on and taking off regular lower body clothing?: None 6 Click Score: 24    End of Session    OT Visit Diagnosis: Unsteadiness on feet (R26.81)   Activity Tolerance Patient tolerated treatment well   Patient Left in chair;with family/visitor present   Nurse Communication Mobility status        Time: 1140-1148 OT Time Calculation (min):  8 min  Charges: OT General Charges $OT Visit: 1 Visit OT Treatments $Self Care/Home Management : 8-22 mins  Hal Neer OTR/L   Malachi Bonds 05/19/2023, 11:55 AM

## 2023-05-19 NOTE — Care Management Important Message (Signed)
 Important Message  Patient Details  Name: Kristopher Davis MRN: [id number] Date of Birth: 1955-12-03   Important Message Given:  Yes - Medicare IM  Patient left prior to Im delivery will mail a copy to the patient home address.   Shaquil Aldana 05/19/2023, 1:35 PM

## 2023-05-19 NOTE — Discharge Summary (Addendum)
 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 What changed: Another medication with the same name was removed. Continue taking this medication, and follow the directions you see here.   apixaban 5 MG Tabs tablet Commonly known as: ELIQUIS Take 1 tablet (5 mg total) by mouth 2 (two) times daily. What changed: when to take this   cyanocobalamin 1000 MCG tablet Commonly known as: VITAMIN B12 Take 1,000 mcg by mouth daily.   losartan 100 MG tablet Commonly known as: COZAAR Take 1 tablet (100 mg total) by mouth daily. Start taking on: May 20, 2023 What changed: See the new instructions.   nicotine 7 mg/24hr patch Commonly known as: NICODERM CQ - dosed in mg/24 hr Place 1 patch (7 mg total) onto the skin daily. Start taking on: May 20, 2023   tadalafil 20 MG tablet Commonly known as: CIALIS Take 1 tablet (20 mg total) by mouth every other day as needed for erectile dysfunction. Take 1 tab 1 hour prior to intercourse   thiamine 50 MG tablet Commonly known as: VITAMIN B-1 Take 1 tablet (50 mg total) by mouth daily.        LABORATORY STUDIES CBC    Component Value Date/Time   WBC 4.5 05/17/2023 0509   RBC 4.17 (L) 05/17/2023 0509   HGB 12.8 (L)  05/17/2023 0509   HGB 14.8 07/13/2015 1043   HCT 37.8 (L) 05/17/2023 0509   HCT 44.1 07/13/2015 1043   PLT 201 05/17/2023 0509   PLT 225 07/13/2015 1043   MCV 90.6 05/17/2023 0509   MCV 91 07/13/2015 1043   MCH 30.7 05/17/2023 0509   MCHC 33.9 05/17/2023 0509   RDW 14.8 05/17/2023 0509   RDW 14.6 07/13/2015 1043   LYMPHSABS 1.2 05/17/2023 0509   LYMPHSABS 1.6 07/13/2015 1043   MONOABS 0.5 05/17/2023 0509   EOSABS 0.1 05/17/2023 0509   EOSABS 0.0 07/13/2015 1043   BASOSABS 0.0 05/17/2023 0509   BASOSABS 0.0 07/13/2015 1043   CMP    Component Value Date/Time   NA 137 05/17/2023 0509   NA 139 07/13/2015 1043   K 3.2 (L) 05/17/2023 0509   CL 106 05/17/2023 0509   CO2 23 05/17/2023 0509   GLUCOSE 72 05/17/2023 0509   BUN 8 05/17/2023 0509   BUN 9 07/13/2015 1043   CREATININE 1.18 05/17/2023 0509   CREATININE 1.23 08/23/2022 0914   CALCIUM 7.9 (L) 05/17/2023 0509   PROT 7.0 05/16/2023 1145   PROT 7.2 07/13/2015 1043   ALBUMIN 3.6 05/16/2023 1145   ALBUMIN 4.1 07/13/2015 1043   AST 23 05/16/2023 1145   ALT 12 05/16/2023 1145   ALKPHOS 38 05/16/2023 1145  BILITOT 0.8 05/16/2023 1145   BILITOT 0.7 07/13/2015 1043   GFRNONAA >60 05/17/2023 0509   GFRNONAA 68 05/06/2020 1431   GFRAA 79 05/06/2020 1431   COAGS Lab Results  Component Value Date   INR 1.1 05/16/2023   Lipid Panel    Component Value Date/Time   CHOL 150 05/17/2023 0509   CHOL 145 07/13/2015 1043   TRIG 74 05/17/2023 0509   HDL 71 05/17/2023 0509   HDL 78 07/13/2015 1043   CHOLHDL 2.1 05/17/2023 0509   VLDL 15 05/17/2023 0509   LDLCALC 64 05/17/2023 0509   LDLCALC 70 08/23/2022 0914   HgbA1C  Lab Results  Component Value Date   HGBA1C 4.5 (L) 05/16/2023   Urine Drug Screen negative Alcohol Level    Component Value Date/Time   ETH 89 (H) 05/16/2023 1145     SIGNIFICANT DIAGNOSTIC STUDIES IR CT Head Ltd Result Date: 05/18/2023 INDICATION: Acute onset of expressive aphasia and mild  right-sided weakness. Occluded superior division of the left middle cerebral artery mid M2 segment on CT angiogram of the head and neck. EXAM: 1. EMERGENT LARGE VESSEL OCCLUSION THROMBOLYSIS (anterior CIRCULATION) COMPARISON:  CT angiogram of the head and neck 05/16/2023. MEDICATIONS: None was administered within 1 hour of the procedure. ANESTHESIA/SEDATION: General anesthesia. CONTRAST:  Omnipaque 300 approximately 45 cc. FLUOROSCOPY TIME:  Fluoroscopy Time: 9 minutes 12 seconds (718 mGy). COMPLICATIONS: None immediate. TECHNIQUE: Following a full explanation of the procedure along with the potential associated complications, an informed witnessed consent was obtained. The risks of intracranial hemorrhage of 10%, worsening neurological deficit, ventilator dependency, death and inability to revascularize were all reviewed in detail with the patient's spouse. The patient was then put under general anesthesia by the Department of Anesthesiology at [facility]. The right groin was prepped and draped in the usual sterile fashion. Thereafter using modified Seldinger technique, transfemoral access into the right common femoral artery was obtained without difficulty. Over an 0.035 inch guidewire an 8 French 25 cm Pinnacle sheath was inserted. Through this, and also over an 0.035 inch guidewire combination of a 100 cm 088 Zoom support catheter with a Bernstein 120 cm catheter was advanced to the aortic arch region and selectively positioned initially in the left common carotid artery and then the distal left internal carotid artery. FINDINGS: Left common carotid arteriogram demonstrates the left external carotid artery and its major branches to be widely patent. Left internal carotid artery at the bulb to the cranial skull base demonstrates wide patency. The petrous, the cavernous and the supraclinoid left ICA demonstrate wide patency. Left anterior cerebral artery opacifies into the capillary and venous phases. The  left middle cerebral M1 segment is widely patent. Occlusion is seen of the superior division in its mid M2 region secondary to a clot. PROCEDURE: Through the Zoom support catheter in the petrous cavernous junction, a combination of an 062 134 cm Penumbra aspiration catheter with an 043 distal aspiration catheter was advanced over an 018 inch Aristotle micro guidewire to the M1 segment and then the wire followed by the microcatheter through the occluded superior division into the M3 region. The micro guidewire was removed. Aspiration was then applied at the hub of the 062 aspiration catheter via a pump and with a 20 mL syringe through the 043 aspiration catheter for 2 minutes. The 043 followed by the 062 aspiration catheter was removed. A control arteriogram performed through the Zoom catheter demonstrated complete revascularization of the left MCA distribution achieving a TICI 3  revascularization. Moderate spasm in the revascularized vessel responded to 2 aliquots of 25 mcg of nitroglycerin. A final control arteriogram performed through the Zoom support catheter in the left common carotid artery revealed patency of the left internal carotid artery extra cranially and intracranially. Distally, the middle cerebral artery distribution maintained a TICI 3 revascularization. The anterior cerebral artery remained widely patent. An 8 French Angio-Seal closure device was deployed for hemostasis at the right groin puncture site. Distal pulses remained present bilaterally unchanged. CT of the brain demonstrated no evidence of intracranial hemorrhage. Patient was extubated. Upon recovery, the patient was able to follow simple commands and move all fours. He continued to have difficulty with expressive difficulties. Patient was then transferred to neuro ICU for post revascularization management. IMPRESSION: Status post revascularization of occluded superior division mid M2 segment of the left middle cerebral artery with 1 pass  with an 062 aspiration catheter achieving a TICI 3 revascularization. PLAN: As per referring MD. Electronically Signed   By: Julieanne Cotton M.D.   On: 05/18/2023 09:05   IR PERCUTANEOUS ART THROMBECTOMY/INFUSION INTRACRANIAL INC DIAG ANGIO Result Date: 05/18/2023 INDICATION: Acute onset of expressive aphasia and mild right-sided weakness. Occluded superior division of the left middle cerebral artery mid M2 segment on CT angiogram of the head and neck. EXAM: 1. EMERGENT LARGE VESSEL OCCLUSION THROMBOLYSIS (anterior CIRCULATION) COMPARISON:  CT angiogram of the head and neck 05/16/2023. MEDICATIONS: None was administered within 1 hour of the procedure. ANESTHESIA/SEDATION: General anesthesia. CONTRAST:  Omnipaque 300 approximately 45 cc. FLUOROSCOPY TIME:  Fluoroscopy Time: 9 minutes 12 seconds (718 mGy). COMPLICATIONS: None immediate. TECHNIQUE: Following a full explanation of the procedure along with the potential associated complications, an informed witnessed consent was obtained. The risks of intracranial hemorrhage of 10%, worsening neurological deficit, ventilator dependency, death and inability to revascularize were all reviewed in detail with the patient's spouse. The patient was then put under general anesthesia by the Department of Anesthesiology at [facility]. The right groin was prepped and draped in the usual sterile fashion. Thereafter using modified Seldinger technique, transfemoral access into the right common femoral artery was obtained without difficulty. Over an 0.035 inch guidewire an 8 French 25 cm Pinnacle sheath was inserted. Through this, and also over an 0.035 inch guidewire combination of a 100 cm 088 Zoom support catheter with a Bernstein 120 cm catheter was advanced to the aortic arch region and selectively positioned initially in the left common carotid artery and then the distal left internal carotid artery. FINDINGS: Left common carotid arteriogram demonstrates the left  external carotid artery and its major branches to be widely patent. Left internal carotid artery at the bulb to the cranial skull base demonstrates wide patency. The petrous, the cavernous and the supraclinoid left ICA demonstrate wide patency. Left anterior cerebral artery opacifies into the capillary and venous phases. The left middle cerebral M1 segment is widely patent. Occlusion is seen of the superior division in its mid M2 region secondary to a clot. PROCEDURE: Through the Zoom support catheter in the petrous cavernous junction, a combination of an 062 134 cm Penumbra aspiration catheter with an 043 distal aspiration catheter was advanced over an 018 inch Aristotle micro guidewire to the M1 segment and then the wire followed by the microcatheter through the occluded superior division into the M3 region. The micro guidewire was removed. Aspiration was then applied at the hub of the 062 aspiration catheter via a pump and with a 20 mL syringe through the 043  aspiration catheter for 2 minutes. The 043 followed by the 062 aspiration catheter was removed. A control arteriogram performed through the Zoom catheter demonstrated complete revascularization of the left MCA distribution achieving a TICI 3 revascularization. Moderate spasm in the revascularized vessel responded to 2 aliquots of 25 mcg of nitroglycerin. A final control arteriogram performed through the Zoom support catheter in the left common carotid artery revealed patency of the left internal carotid artery extra cranially and intracranially. Distally, the middle cerebral artery distribution maintained a TICI 3 revascularization. The anterior cerebral artery remained widely patent. An 8 French Angio-Seal closure device was deployed for hemostasis at the right groin puncture site. Distal pulses remained present bilaterally unchanged. CT of the brain demonstrated no evidence of intracranial hemorrhage. Patient was extubated. Upon recovery, the patient was  able to follow simple commands and move all fours. He continued to have difficulty with expressive difficulties. Patient was then transferred to neuro ICU for post revascularization management. IMPRESSION: Status post revascularization of occluded superior division mid M2 segment of the left middle cerebral artery with 1 pass with an 062 aspiration catheter achieving a TICI 3 revascularization. PLAN: As per referring MD. Electronically Signed   By: Julieanne Cotton M.D.   On: 05/18/2023 09:05   VAS [place] LOWER EXTREMITY VENOUS (DVT) Result Date: 05/18/2023  Lower Venous DVT Study Patient Name:  Kristopher Davis  Date of Exam:   05/17/2023 Medical Rec #: [id number]    Accession #:    [id number] Date of Birth: Jun 04, 1955    Patient Gender: M Patient Age:   47 years Exam Location:  [facility] Procedure:      VAS [place] LOWER EXTREMITY VENOUS (DVT) Referring Phys: Scheryl Marten Davyn Elsasser --------------------------------------------------------------------------------  Indications: Stroke. Other Indications: History of PE on eliquis. Comparison Study: No priors. Performing Technologist: Lucien Sink Sturdivant-Jones RDMS, RVT  Examination Guidelines: A complete evaluation includes B-mode imaging, spectral Doppler, color Doppler, and power Doppler as needed of all accessible portions of each vessel. Bilateral testing is considered an integral part of a complete examination. Limited examinations for reoccurring indications may be performed as noted. The reflux portion of the exam is performed with the patient in reverse Trendelenburg.  +---------+---------------+---------+-----------+----------+--------------+ RIGHT    CompressibilityPhasicitySpontaneityPropertiesThrombus Aging +---------+---------------+---------+-----------+----------+--------------+ CFV      Full           Yes      Yes                                 +---------+---------------+---------+-----------+----------+--------------+ SFJ      Full                                                         +---------+---------------+---------+-----------+----------+--------------+ FV Prox  Full                                                        +---------+---------------+---------+-----------+----------+--------------+ FV Mid   Full                                                        +---------+---------------+---------+-----------+----------+--------------+  FV DistalFull                                                        +---------+---------------+---------+-----------+----------+--------------+ PFV      Full                                                        +---------+---------------+---------+-----------+----------+--------------+ POP      Full           Yes      Yes                                 +---------+---------------+---------+-----------+----------+--------------+ PTV      Full                                                        +---------+---------------+---------+-----------+----------+--------------+ PERO     Full                                                        +---------+---------------+---------+-----------+----------+--------------+   +---------+---------------+---------+-----------+----------+--------------+ LEFT     CompressibilityPhasicitySpontaneityPropertiesThrombus Aging +---------+---------------+---------+-----------+----------+--------------+ CFV      Full           Yes      Yes                                 +---------+---------------+---------+-----------+----------+--------------+ SFJ      Full                                                        +---------+---------------+---------+-----------+----------+--------------+ FV Prox  Full                                                        +---------+---------------+---------+-----------+----------+--------------+ FV Mid   Full                                                         +---------+---------------+---------+-----------+----------+--------------+ FV DistalFull                                                        +---------+---------------+---------+-----------+----------+--------------+  PFV      Full                                                        +---------+---------------+---------+-----------+----------+--------------+ POP      Full           Yes      Yes                                 +---------+---------------+---------+-----------+----------+--------------+ PTV      Full                                                        +---------+---------------+---------+-----------+----------+--------------+ PERO     Full                                                        +---------+---------------+---------+-----------+----------+--------------+     Summary: BILATERAL: - No evidence of deep vein thrombosis seen in the lower extremities, bilaterally. -No evidence of popliteal cyst, bilaterally.   *See table(s) above for measurements and observations. Electronically signed by Coral Else MD on 05/18/2023 at 7:40:24 AM.    Final    ECHOCARDIOGRAM COMPLETE Result Date: 05/17/2023    ECHOCARDIOGRAM REPORT   Patient Name:   Kristopher Davis Date of Exam: 05/17/2023 Medical Rec #:  [id number]   Height:       76.0 in Accession #:    [id number]  Weight:       278.0 lb Date of Birth:  08-10-1955   BSA:          2.548 m Patient Age:    67 years    BP:           135/86 mmHg Patient Gender: M           HR:           56 bpm. Exam Location:  Inpatient Procedure: 2D Echo, 3D Echo, Cardiac Doppler and Color Doppler (Both Spectral            and Color Flow Doppler were utilized during procedure). Indications:    Stroke I63.9  History:        Patient has no prior history of Echocardiogram examinations.                 Stroke; Risk Factors:Hypertension and Dyslipidemia.  Sonographer:    Lucendia Herrlich RCS Referring Phys: [id number] DEVON SHAFER IMPRESSIONS  1.  Left ventricular ejection fraction, by estimation, is 60 to 65%. The left ventricle has normal function. The left ventricle has no regional wall motion abnormalities. Left ventricular diastolic parameters are consistent with Grade I diastolic dysfunction (impaired relaxation).  2. Right ventricular systolic function is normal. The right ventricular size is normal. There is normal pulmonary artery systolic pressure.  3. The mitral valve is normal in structure. Trivial mitral valve regurgitation. No evidence of mitral stenosis.  4. The aortic valve is tricuspid. Aortic  valve regurgitation is trivial. No aortic stenosis is present.  5. The inferior vena cava is normal in size with greater than 50% respiratory variability, suggesting right atrial pressure of 3 mmHg. FINDINGS  Left Ventricle: Left ventricular ejection fraction, by estimation, is 60 to 65%. The left ventricle has normal function. The left ventricle has no regional wall motion abnormalities. The left ventricular internal cavity size was normal in size. There is  no left ventricular hypertrophy. Left ventricular diastolic parameters are consistent with Grade I diastolic dysfunction (impaired relaxation). Normal left ventricular filling pressure. Right Ventricle: The right ventricular size is normal. No increase in right ventricular wall thickness. Right ventricular systolic function is normal. There is normal pulmonary artery systolic pressure. The tricuspid regurgitant velocity is 0.91 m/s, and  with an assumed right atrial pressure of 3 mmHg, the estimated right ventricular systolic pressure is 6.3 mmHg. Left Atrium: Left atrial size was normal in size. Right Atrium: Right atrial size was normal in size. Pericardium: There is no evidence of pericardial effusion. Mitral Valve: The mitral valve is normal in structure. Trivial mitral valve regurgitation. No evidence of mitral valve stenosis. Tricuspid Valve: The tricuspid valve is normal in structure.  Tricuspid valve regurgitation is trivial. No evidence of tricuspid stenosis. Aortic Valve: The aortic valve is tricuspid. Aortic valve regurgitation is trivial. No aortic stenosis is present. Aortic valve peak gradient measures 3.5 mmHg. Pulmonic Valve: The pulmonic valve was normal in structure. Pulmonic valve regurgitation is not visualized. No evidence of pulmonic stenosis. Aorta: The aortic root is normal in size and structure. Venous: The inferior vena cava is normal in size with greater than 50% respiratory variability, suggesting right atrial pressure of 3 mmHg. IAS/Shunts: No atrial level shunt detected by color flow Doppler.  LEFT VENTRICLE PLAX 2D LVIDd:         4.80 cm   Diastology LVIDs:         3.50 cm   LV e' medial:    6.64 cm/s LV PW:         0.80 cm   LV E/e' medial:  8.4 LV IVS:        0.90 cm   LV e' lateral:   11.20 cm/s LVOT diam:     2.50 cm   LV E/e' lateral: 5.0 LV SV:         74 LV SV Index:   29 LVOT Area:     4.91 cm                           3D Volume EF:                          3D EF:        58 %                          LV EDV:       180 ml                          LV ESV:       75 ml                          LV SV:        105 ml RIGHT VENTRICLE  IVC RV S prime:     12.00 cm/s  IVC diam: 1.30 cm TAPSE (M-mode): 1.4 cm LEFT ATRIUM             Index        RIGHT ATRIUM           Index LA diam:        3.70 cm 1.45 cm/m   RA Area:     17.50 cm LA Vol (A2C):   45.5 ml 17.86 ml/m  RA Volume:   43.90 ml  17.23 ml/m LA Vol (A4C):   53.5 ml 21.00 ml/m LA Biplane Vol: 51.7 ml 20.29 ml/m  AORTIC VALVE AV Area (Vmax): 3.76 cm AV Vmax:        93.80 cm/s AV Peak Grad:   3.5 mmHg LVOT Vmax:      71.90 cm/s LVOT Vmean:     43.300 cm/s LVOT VTI:       0.150 m  AORTA Ao Root diam: 3.80 cm Ao Asc diam:  3.90 cm MITRAL VALVE               TRICUSPID VALVE MV Area (PHT): 2.80 cm    TR Peak grad:   3.3 mmHg MV Decel Time: 271 msec    TR Vmax:        91.00 cm/s MV E velocity: 55.60 cm/s MV  A velocity: 71.10 cm/s  SHUNTS MV E/A ratio:  0.78        Systemic VTI:  0.15 m                            Systemic Diam: 2.50 cm Chilton Si MD Electronically signed by Chilton Si MD Signature Date/Time: 05/17/2023/3:43:24 PM    Final    MR BRAIN WO CONTRAST Result Date: 05/17/2023 CLINICAL DATA:  Follow-up examination for stroke. EXAM: MRI HEAD WITHOUT CONTRAST TECHNIQUE: Multiplanar, multiecho pulse sequences of the brain and surrounding structures were obtained without intravenous contrast. COMPARISON:  CTs from 05/16/2023. FINDINGS: Brain: Cerebral volume within normal limits. Patchy T2/FLAIR hyperintensity involving the periventricular complete deep, and subcortical white matter both cerebral hemispheres, most likely related chronic microvascular ischemic disease, mild for age. Patchy small volume restricted diffusion involving the left insula, overlying left frontal lobe, and left temporal occipital region, consistent with acute left MCA distribution infarct. Small focus of associated susceptibility artifact at the left temporal occipital region consistent with mild petechial blood products (series 12, image 40). No frank hemorrhagic transformation or significant regional mass effect. No other evidence for acute or subacute ischemia. No other acute intracranial hemorrhage. Single chronic microhemorrhage noted within the right cerebellum. No mass lesion, midline shift or mass effect. No hydrocephalus or extra-axial fluid collection. Pituitary gland within normal limits. Vascular: Major intracranial vascular flow voids are maintained. Skull and upper cervical spine: Craniocervical junction within normal limits. Bone marrow signal intensity normal. No scalp soft tissue abnormality. Sinuses/Orbits: Globes orbital soft tissues within normal limits. Left maxillary sinus retention cyst noted. Paranasal sinuses are otherwise largely clear. Small left mastoid effusion noted, of doubtful significance.  Other: None. IMPRESSION: 1. Patchy small volume acute left MCA distribution infarct as above. Associated mild petechial blood products at the left temporoccipital region without frank hemorrhagic transformation or significant regional mass effect. 2. Underlying mild chronic microvascular ischemic disease. Electronically Signed   By: Rise Mu M.D.   On: 05/17/2023 03:00   CT HEAD POST STROKE FOLLOWUP/TIMED/STAT READ Result Date:  05/16/2023 CLINICAL DATA:  Code stroke. Initial evaluation for acute neuro deficit, neuro change. EXAM: CT HEAD WITHOUT CONTRAST TECHNIQUE: Contiguous axial images were obtained from the base of the skull through the vertex without intravenous contrast. RADIATION DOSE REDUCTION: This exam was performed according to the departmental dose-optimization program which includes automated exposure control, adjustment of the mA and/or kV according to patient size and/or use of iterative reconstruction technique. COMPARISON:  Prior studies from earlier the same day. FINDINGS: Brain: Cerebral volume within normal limits. Mild chronic microvascular ischemic disease. No acute intracranial hemorrhage. No acute large vessel territory infarct. No mass lesion or midline shift. No hydrocephalus or extra-axial fluid collection. Vascular: No abnormal hyperdense vessel. Previously seen hyperdense left M2 is no longer visualized. Skull: Scalp soft tissues demonstrate no acute finding. Calvarium intact. Sinuses/Orbits: Globes orbital soft tissues within normal limits. Paranasal sinuses and mastoid air cells remain largely clear. Other: None. IMPRESSION: 1. No acute intracranial abnormality. 2. Mild chronic microvascular ischemic disease. Electronically Signed   By: Rise Mu M.D.   On: 05/16/2023 23:37   CT ANGIO HEAD NECK W WO CM W PERF (CODE STROKE) Result Date: 05/16/2023 CLINICAL DATA:  Code stroke. 68 year old male. Suspicious left MCA hyperdensity and clinical left MCA syndrome,  last known well last night. EXAM: CT ANGIOGRAPHY HEAD AND NECK CT PERFUSION BRAIN TECHNIQUE: Multidetector CT imaging of the head and neck was performed using the standard protocol during bolus administration of intravenous contrast. Multiplanar CT image reconstructions and MIPs were obtained to evaluate the vascular anatomy. Carotid stenosis measurements (when applicable) are obtained utilizing NASCET criteria, using the distal internal carotid diameter as the denominator. Multiphase CT imaging of the brain was performed following IV bolus contrast injection. Subsequent parametric perfusion maps were calculated using RAPID software. RADIATION DOSE REDUCTION: This exam was performed according to the departmental dose-optimization program which includes automated exposure control, adjustment of the mA and/or kV according to patient size and/or use of iterative reconstruction technique. CONTRAST:  OMNIPAQUE IOHEXOL 350 MG/ML SOLN COMPARISON:  Head CT 1148 hours today. FINDINGS: CT Brain Perfusion Findings: Initial attempt failed to process due to poor arterial input function. Fortunately, repeat attempt at 1227 hours is diagnostic. CBF (<30%) Volume: 0mL (5-9 mL using less transient parameters) Perfusion (Tmax>6.0s) volume: 40mL. With hypoperfusion index of 0.2, 7 mL T-max greater than 10 volume. Mismatch Volume: Approximately 40mL Infarction Location:Left MCA This was discussed by telephone with Dr. Ritta Slot on 05/16/2023 at 1235 hours. CTA NECK Skeleton: Absent dentition. Cervical spine degeneration. No acute osseous abnormality identified. Upper chest: Negative. Other neck: Leftward gaze is noted.  Otherwise within normal limits. Aortic arch: Suboptimal arterial contrast.  Normal 3 vessel arch. Right carotid system: Tortuosity of the proximal right CCA. Widely patent right carotid bifurcation with no plaque or stenosis identified. Left carotid system: A portion of the mid left CCA obscured by dense  left jugular venous contrast reflux and streak. Patent left CCA left carotid bifurcation. Mostly calcified plaque at the left ICA origin without stenosis. Patent left ICA to the skull base. Vertebral arteries: Suboptimal contrast bolus, codominant and within normal limits. CTA HEAD Posterior circulation: Suboptimal contrast bolus. Distal vertebral arteries, vertebrobasilar junction, left PICA origin, dominant appearing right AICA origin, basilar artery, SCA and PCA origins appear patent and within normal limits. Posterior communicating arteries are diminutive or absent. Limited bilateral PCA branch detail, grossly patent. Anterior circulation: Both ICA siphons are patent. Detail limited due to suboptimal arterial bolus. Mild calcified plaque on  the left. Patent carotid termini, MCA and ACA origins. Visible bilateral ACA branches are within normal limits. Right MCA M1 segment and bifurcation appear patent. Left MCA M1 segment appears patent but there is abrupt termination of enhancement just distal to the left MCA bifurcation on series 9, image 19 corresponding to the area of suspicious hyperdensity. Although bilateral MCA branch detail is limited due to the contrast bolus the constellation is consistent with left MCA M2 large vessel occlusion. Venous sinuses: Not well evaluated due to contrast bolus. Anatomic variants: None significant. Review of the MIP images confirms the above findings Preliminary report of left MCA M2 ELVO discussed by telephone with Dr. Ritta Slot on 05/16/2023 at 1204 hours. IMPRESSION: 1. Suboptimal CTA, and CTP which required repeating. But positive for Left MCA M2 ELVO at the bi/trifurcation, corresponding to suspicious CT hyperdensity. And repeat CTP indicates 40 mL of left MCA middle division territory penumbra with no significant infarct core. 2.  Study discussed by telephone with Dr. Ritta Slot. Electronically Signed   By: Odessa Fleming M.D.   On: 05/16/2023 12:39   CT HEAD  CODE STROKE WO CONTRAST Result Date: 05/16/2023 CLINICAL DATA:  Code stroke.  68 year old male. EXAM: CT HEAD WITHOUT CONTRAST TECHNIQUE: Contiguous axial images were obtained from the base of the skull through the vertex without intravenous contrast. RADIATION DOSE REDUCTION: This exam was performed according to the departmental dose-optimization program which includes automated exposure control, adjustment of the mA and/or kV according to patient size and/or use of iterative reconstruction technique. COMPARISON:  Head CT 09/16/2022. FINDINGS: Brain: Cavum septum pellucidum redemonstrated, normal variant. Stable cerebral volume. No midline shift, ventriculomegaly, mass effect, evidence of mass lesion, intracranial hemorrhage or evidence of cortically based acute infarction. Mild for age mostly periventricular white matter hypodensity appears stable along with gray-white differentiation in both hemispheres. Vascular: Asymmetric and suspicious hyperdensity of the left MCA bifurcation in the left sylvian fissure series 3, image 12. Skull: Stable, intact. Sinuses/Orbits: Visualized paranasal sinuses and mastoids are clear. Other: No acute orbit or scalp soft tissue finding identified. ASPECTS [facility] Stroke Program Early CT Score) Total score (0-10 with 10 being normal): 10 IMPRESSION: 1. Suspicious Left MCA hyperdensity in the sylvian fissure, but otherwise stable non contrast CT appearance of the brain since last year. ASPECTS 10. 2. These results were communicated to Dr. Amada Jupiter at 11:59 am on 05/16/2023 by text page via the [facility] messaging system. Electronically Signed   By: Odessa Fleming M.D.   On: 05/16/2023 11:59       HISTORY OF PRESENT ILLNESS Kristopher Davis is a 68 y.o. male with history of hypertension, PE (on Eliquis from July 2024 through January 2025, positive PFO, hypertension, hyperlipidemia, cognitive impairment, tobacco use and EtOH use who presented to [facility] as an EMS-activated code stroke due to  difficulty speaking.  CTA showed proximal left M2 occlusion.  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  S/p Mechanical thrombectomy with removal of clots from main, left and right PA. Left DVT seen on Venous Duplex 08/2022 [facility]) Unprovoked   Eliquis 5 mg twice daily July 2024 through January 2025   Eliquis restarted 5mg  BID    PFO 08/2022 CTA chest in [facility] performed which revealed acute bilateral pulmonary emboli along with evidence of acute right heart strain with RV: LV ratio of approximately 3. A linear hypodensity was noted that appeared to cross the interatrial septum and there was concern for a clot in transit across a PFO.  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 Yes Nicotine replacement therapy provided   Other Stroke Risk Factors ETOH use, alcohol level 89, advised to drink no more than 1-2 drink(s) a day Obesity, BMI 33.84  BMI >/= 30 associated with increased stroke risk, recommend weight loss, diet and exercise as appropriate    Other acute issues Cognitive impairment, sundowning reported in 08/2022 while hospitalized in [facility]   DISCHARGE EXAM  PHYSICAL EXAM General:  Alert, well-nourished, well-developed patient in no acute distress Psych:  Mood and affect appropriate for situation CV: Regular rate and rhythm on monitor Respiratory:  Regular, unlabored respirations on room air GI: Abdomen soft and nontender  NEURO:  Mental Status: AA&Ox3  Speech/Language: Mild dysarthria, expressive aphasia present with mild word finding difficulties. 3/4 naming. Repetition of simple sentences intact with dysarthria.  Cranial Nerves:  II: PERRL. Visual fields full.  III, IV, VI: EOMI. Eyelids elevate symmetrically.  V: Sensation is intact to light touch and symmetrical to face.  VII: Smile is symmetrical.  VIII: hearing intact to voice. IX, X: Palate elevates symmetrically. Phonation is normal.  WN:[id number] shrug 5/5. XII: tongue is midline without fasciculations. Motor: 5/5 strength to all muscle groups tested.  Tone: is normal and bulk is normal Sensation- Intact to light touch bilaterally. Extinction absent to light touch to DSS. Coordination: FTN intact bilaterally, HKS: no ataxia in BLE.No drift.  Gait- deferred  NIHSS 1   Discharge Diet       Diet   Diet heart healthy/carb modified Room service appropriate? Yes; Fluid consistency: Thin   liquids  DISCHARGE PLAN Disposition: Home with outpatient occupational therapy and speech therapy Eliquis 5 mg twice daily  for secondary stroke prevention for  life Ongoing stroke risk factor control by Primary Care Physician at time of discharge Follow-up PCP Alba Cory, MD in 2 weeks. Follow-up in [facility] in 8 weeks, office to schedule an appointment. Able to see NP in clinic.  50 minutes were spent preparing discharge.   Gevena Mart DNP, ACNPC-AG  [facility]  ATTENDING NOTE: I reviewed above note  and agree with the assessment and plan. Pt was seen and examined.   No acute event overnight. Speech much improved, near baseline. On eliquis, restarted yesterday. Plan for home today. Continue eliquis, follow up at [facility]  For detailed assessment and plan, please refer to above/below as I have made changes wherever appropriate.   Marvel Plan, MD PhD Stroke Neurology 05/19/2023 9:23 PM

## 2023-05-19 NOTE — Discharge Instructions (Signed)

## 2023-05-19 NOTE — Progress Notes (Signed)
 Nursing Discharge Note   Name: Kristopher Davis MRN: [id number] DOB: [birth year]    Admit Date:  05/16/2023  Discharge Date:  05/19/2023   Kristopher Davis is to be discharged home per MD order.  AVS completed. Reviewed with patient and family at bedside. Highlighted copy provided for patient to take home.  Patient/caregiver able to verbalize understanding of discharge instructions. PIV removed. Patient stable upon discharge.     Discharge Instructions      Information on my medicine - ELIQUIS (apixaban)  This medication education was reviewed with me or my healthcare representative as part of my discharge preparation.    Why was Eliquis prescribed for you? Eliquis was prescribed for you to reduce the risk of a blood clot forming that can cause a stroke if you have a medical condition called atrial fibrillation (a type of irregular heartbeat).  What do You need to know about Eliquis ? Take your Eliquis TWICE DAILY - one tablet in the morning and one tablet in the evening with or without food. If you have difficulty swallowing the tablet whole please discuss with your pharmacist how to take the medication safely.  Take Eliquis exactly as prescribed by your doctor and DO NOT stop taking Eliquis without talking to the doctor who prescribed the medication.  Stopping may increase your risk of developing a stroke.  Refill your prescription before you run out.  After discharge, you should have regular check-up appointments with your healthcare provider that is prescribing your Eliquis.  In the future your dose may need to be changed if your kidney function or weight changes by a significant amount or as you get older.  What do you do if you miss a dose? If you miss a dose, take it as soon as you remember on the same day and resume taking twice daily.  Do not take more than one dose of ELIQUIS at the same time to make up a missed dose.  Important Safety Information A possible side effect of  Eliquis is bleeding. You should call your healthcare provider right away if you experience any of the following: Bleeding from an injury or your nose that does not stop. Unusual colored urine (red or dark brown) or unusual colored stools (red or black). Unusual bruising for unknown reasons. A serious fall or if you hit your head (even if there is no bleeding).  Some medicines may interact with Eliquis and might increase your risk of bleeding or clotting while on Eliquis. To help avoid this, consult your healthcare provider or pharmacist prior to using any new prescription or non-prescription medications, including herbals, vitamins, non-steroidal anti-inflammatory drugs (NSAIDs) and supplements.  This website has more information on Eliquis (apixaban): http://[email or web address].com/eliquis/home       Discharge Instructions     Ambulatory referral to Neurology   Complete by: As directed    An appointment is requested in approximately: 8 weeks   Ambulatory referral to Occupational Therapy   Complete by: As directed    Ambulatory referral to Speech Therapy   Complete by: As directed    Call MD for:   Complete by: As directed    Please call your primary care provider or 911 for acute emergency for sudden onset of weakness facial droop slurred speech vision changes   Diet - low sodium heart healthy   Complete by: As directed    Discharge instructions   Complete by: As directed    Please follow-up with your primary care provider in 7  to 10 days after discharge Please call [facility] and make an appointment for 8 weeks after discharge Please continue to take Eliquis  5 mg twice daily by mouth   Increase activity slowly   Complete by: As directed         Allergies as of 05/19/2023       Reactions   Levofloxacin In D5w    hallucinations        Medication List     TAKE these medications    amLODipine 2.5 MG tablet Commonly known as: NORVASC Take 1 tablet  (2.5 mg total) by mouth daily. What changed: Another medication with the same name was removed. Continue taking this medication, and follow the directions you see here.   cyanocobalamin 1000 MCG tablet Commonly known as: VITAMIN B12 Take 1,000 mcg by mouth daily.   Eliquis 5 MG Tabs tablet Generic drug: apixaban Take 1 tablet (5 mg total) by mouth 2 (two) times daily. What changed: when to take this   losartan 100 MG tablet Commonly known as: COZAAR Take 1 tablet (100 mg total) by mouth daily. Start taking on: May 20, 2023 What changed: See the new instructions.   nicotine 7 mg/24hr patch Commonly known as: NICODERM CQ - dosed in mg/24 hr Place 1 patch (7 mg total) onto the skin daily. Start taking on: May 20, 2023   tadalafil 20 MG tablet Commonly known as: CIALIS Take 1 tablet (20 mg total) by mouth every other day as needed for erectile dysfunction. Take 1 tab 1 hour prior to intercourse   thiamine 50 MG tablet Commonly known as: VITAMIN B-1 Take 1 tablet (50 mg total) by mouth daily.         Discharge Instructions/ Education: An After Visit Summary was printed and given to the patient. Discharge instructions given to patient/family with verbalized understanding. Discharge education completed with patient/family including: follow up instructions, medication list, discharge activities, and limitations if indicated.  Additional discharge instructions as indicated by discharging provider also reviewed.  Patient and family able to verbalize understanding, all questions fully answered. Patient instructed to return to Emergency Department, call 911, or call MD for any changes in condition.   Patient did not want to wait for staff assistance or wheelchair to [facility], he stated that he would go to [facility] and pick up medications and to lobby.  Ambulatory with wife to lobby.

## 2023-05-19 NOTE — Plan of Care (Signed)
  Problem: Education: Goal: Knowledge of disease or condition will improve Outcome: Adequate for Discharge Goal: Knowledge of secondary prevention will improve (MUST DOCUMENT ALL) Outcome: Adequate for Discharge Goal: Knowledge of patient specific risk factors will improve (DELETE if not current risk factor) Outcome: Adequate for Discharge   Problem: Ischemic Stroke/TIA Tissue Perfusion: Goal: Complications of ischemic stroke/TIA will be minimized Outcome: Adequate for Discharge   Problem: Coping: Goal: Will verbalize positive feelings about self Outcome: Adequate for Discharge Goal: Will identify appropriate support needs Outcome: Adequate for Discharge   Problem: Health Behavior/Discharge Planning: Goal: Ability to manage health-related needs will improve Outcome: Adequate for Discharge Goal: Goals will be collaboratively established with patient/family Outcome: Adequate for Discharge   Problem: Self-Care: Goal: Ability to participate in self-care as condition permits will improve Outcome: Adequate for Discharge Goal: Verbalization of feelings and concerns over difficulty with self-care will improve Outcome: Adequate for Discharge Goal: Ability to communicate needs accurately will improve Outcome: Adequate for Discharge   Problem: Nutrition: Goal: Risk of aspiration will decrease Outcome: Adequate for Discharge Goal: Dietary intake will improve Outcome: Adequate for Discharge   Problem: Education: Goal: Knowledge of General Education information will improve Description: Including pain rating scale, medication(s)/side effects and non-pharmacologic comfort measures Outcome: Adequate for Discharge   Problem: Health Behavior/Discharge Planning: Goal: Ability to manage health-related needs will improve Outcome: Adequate for Discharge   Problem: Clinical Measurements: Goal: Ability to maintain clinical measurements within normal limits will improve Outcome: Adequate for  Discharge Goal: Will remain free from infection Outcome: Adequate for Discharge Goal: Diagnostic test results will improve Outcome: Adequate for Discharge Goal: Respiratory complications will improve Outcome: Adequate for Discharge Goal: Cardiovascular complication will be avoided Outcome: Adequate for Discharge   Problem: Activity: Goal: Risk for activity intolerance will decrease Outcome: Adequate for Discharge   Problem: Nutrition: Goal: Adequate nutrition will be maintained Outcome: Adequate for Discharge   Problem: Coping: Goal: Level of anxiety will decrease Outcome: Adequate for Discharge   Problem: Elimination: Goal: Will not experience complications related to bowel motility Outcome: Adequate for Discharge Goal: Will not experience complications related to urinary retention Outcome: Adequate for Discharge   Problem: Pain Managment: Goal: General experience of comfort will improve and/or be controlled Outcome: Adequate for Discharge   Problem: Safety: Goal: Ability to remain free from injury will improve Outcome: Adequate for Discharge   Problem: Skin Integrity: Goal: Risk for impaired skin integrity will decrease Outcome: Adequate for Discharge   Problem: Education: Goal: Understanding of CV disease, CV risk reduction, and recovery process will improve Outcome: Adequate for Discharge Goal: Individualized Educational Video(s) Outcome: Adequate for Discharge   Problem: Activity: Goal: Ability to return to baseline activity level will improve Outcome: Adequate for Discharge   Problem: Cardiovascular: Goal: Ability to achieve and maintain adequate cardiovascular perfusion will improve Outcome: Adequate for Discharge Goal: Vascular access site(s) Level 0-1 will be maintained Outcome: Adequate for Discharge   Problem: Health Behavior/Discharge Planning: Goal: Ability to safely manage health-related needs after discharge will improve Outcome: Adequate for  Discharge

## 2023-05-22 ENCOUNTER — Telehealth: Payer: Self-pay

## 2023-05-22 NOTE — Transitions of Care (Post Inpatient/ED Visit) (Signed)
 05/22/2023  Name: Kristopher Davis MRN: [id number] DOB: 12-26-1955  Today's TOC FU Call Status: Today's TOC FU Call Status:: Successful TOC FU Call Completed TOC FU Call Complete Date: 05/22/23 Patient's Name and Date of Birth confirmed.  Transition Care Management Follow-up Telephone Call Date of Discharge: 05/19/23 Discharge Facility: [facility]) Type of Discharge: Inpatient Admission Primary Inpatient Discharge Diagnosis:: Middle cerebral artery embolism, left How have you been since you were released from the hospital?: Better Any questions or concerns?: No  Items Reviewed: Did you receive and understand the discharge instructions provided?: Yes Medications obtained,verified, and reconciled?: Yes (Medications Reviewed) (Medication reconciliation completed based on recent discharge summary Patient taking medications as instructed and is aware of any changes or dosage adjustments medication regimen. Patient denies questions and reports no barriers to medication adherence) Any new allergies since your discharge?: No Dietary orders reviewed?: Yes Type of Diet Ordered:: Reg Heart Healthy Do you have support at home?: Yes People in Home: spouse Name of Support/Comfort Primary Source: Scarlette Calico  Medications Reviewed Today: Medications Reviewed Today     Reviewed by Johnnette Barrios, RN (Registered Nurse) on 05/22/23 at 1021  Med List Status: <None>   Medication Order Taking? Sig Documenting Provider Last Dose Status Informant  amLODipine (NORVASC) 2.5 MG tablet [id number] Yes Take 1 tablet (2.5 mg total) by mouth daily. Alba Cory, MD Taking Active Spouse/Significant Other, Pharmacy Records  apixaban [facility]) 5 MG TABS tablet [id number] Yes Take 1 tablet (5 mg total) by mouth 2 (two) times daily. Mathews Argyle, NP Taking Active   cyanocobalamin (VITAMIN B12) 1000 MCG tablet [id number] No Take 1,000 mcg by mouth daily.  Patient not taking: Reported on 05/22/2023   [provider] Not Taking Active Spouse/Significant Other, Pharmacy Records  losartan (COZAAR) 100 MG tablet [id number] Yes Take 1 tablet (100 mg total) by mouth daily. Mathews Argyle, NP Taking Active   nicotine (NICODERM CQ - DOSED IN MG/24 HR) 7 mg/24hr patch [id number] No Place 1 patch (7 mg total) onto the skin daily.  Patient not taking: Reported on 05/22/2023   Mathews Argyle, NP Not Taking Active   tadalafil (CIALIS) 20 MG tablet [id number] Yes Take 1 tablet (20 mg total) by mouth every other day as needed for erectile dysfunction. Take 1 tab 1 hour prior to intercourse Alba Cory, MD Taking Active Spouse/Significant Other, Pharmacy Records  thiamine (VITAMIN B-1) 50 MG tablet [id number] No Take 1 tablet (50 mg total) by mouth daily.  Patient not taking: Reported on 05/22/2023   Alba Cory, MD Not Taking Active Spouse/Significant Other, Pharmacy Records          Medication reconciliation / review completed based on most recent discharge summary and EHR medication list. Confirmed patient is taking all newly prescribed medications as instructed (any discrepancies are noted in review section)   Patient / Caregiver is aware of any changes to and / or  any dosage adjustments to medication regimen. Patient/ Caregiver denies questions at this time and reports no barriers to medication adherence.   Medication changes and updates  START taking: nicotine (NICODERM CQ - dosed in mg/24 hr)- he has not started this  Start taking on: May 20, 2023 CHANGE how you take: amLODipine (NORVASC) apixaban (ELIQUIS)-Please continue to take Eliquis 5 mg twice daily by mouth losartan (COZAAR)   Home Care and Equipment/Supplies: Were Home Health Services Ordered?: No Any new equipment or medical supplies ordered?: No  Functional Questionnaire: Do you need assistance with  bathing/showering or dressing?: No Do you need assistance with meal preparation?: No Do you need assistance with eating?:  No Do you have difficulty maintaining continence: No Do you need assistance with getting out of bed/getting out of a chair/moving?: No Do you have difficulty managing or taking your medications?: No  Follow up appointments reviewed: PCP Follow-up appointment confirmed?: No (He will call and schedule) MD Provider Line Number:[phone] Given: No Specialist Hospital Follow-up appointment confirmed?: No Reason Specialist Follow-Up Not Confirmed: Patient has Specialist Provider Number and will Call for Appointment (Please call [facility] and make an appointment for 8 weeks after discharge ,[facility]  [facility]  The outpatient rehab will contact you for the first appointment)  He has the following pending follow-up visits  Please follow-up with your primary care provider in 7 to 10 days after discharge  [facility] Specialty: Rehabilitation The outpatient rehab will contact you for the first appointment  Schedule an appointment with [facility] as soon as possible for a visit in 1 month Specialty: Neurology Please call make an appointment for 8 weeks after discharge  Scheduled follow-up  Earna Coder Tuesday Sep 12, 2023 1:45 PM [facility]  Do you need transportation to your follow-up appointment?: No  The following referrals were made and reviewed with the patient  Referrals Ambulatory referral to Neurology Complete by: As directed [facility] [phone] An appointment is requested in approximately: 8 weeks  Ambulatory referral to Occupational Therapy Complete by: As directed [facility] [phone]  Ambulatory referral to Speech Therapy Complete by: As directed [facility] [phone]    Do you understand care options if your condition(s) worsen?: Yes-patient verbalized understanding    SDOH Interventions Today    Flowsheet Row Most Recent Value  SDOH Interventions   Food Insecurity Interventions Intervention Not Indicated  Housing Interventions Intervention Not Indicated  Transportation Interventions Intervention Not Indicated, Patient Resources (Friends/Family)  Utilities Interventions Intervention Not Indicated       Interventions Today    Flowsheet Row Most Recent Value  Chronic Disease   Chronic disease during today's visit Other  [CVA]  General Interventions   General Interventions Discussed/Reviewed General Interventions Discussed, General Interventions Reviewed, Doctor Visits  Doctor Visits Discussed/Reviewed Doctor Visits Discussed, Doctor Visits Reviewed, PCP, Specialist  PCP/Specialist Visits Compliance with follow-up visit  Exercise Interventions   Exercise Discussed/Reviewed Exercise Discussed, Exercise Reviewed, Physical Activity  Physical Activity Discussed/Reviewed Physical Activity Reviewed  Nutrition Interventions   Nutrition Discussed/Reviewed Nutrition Discussed, Nutrition Reviewed, Decreasing salt  Pharmacy Interventions   Pharmacy Dicussed/Reviewed Pharmacy Topics Discussed, Pharmacy Topics Reviewed, Medications and their functions, Medication Adherence  Safety Interventions   Safety Discussed/Reviewed Safety Reviewed       Benefits reviewed  Based on current information and Insurance plan -Reviewed benefits accessible to patient, including details about eligibility options for care and  available value based care options  if any areas of needs were identified.  Reviewed patient/  caregiver's ability to access and / or  ability with navigating the benefits system..Amb Referral made if indicted , refer to orders section of note for details   Reviewed goals for care Patient  and / or Caregiveverbalizes understanding of instructions and  care plan provided. Patient / Caregiver was encouraged to make informed decisions about their care, actively participate in managing their health condition,  and implement lifestyle changes as needed to promote independence and self-management of health care. There were no reported  barriers to care.   TOC program  Patient is at  risk for readmission and / or has history of  high utilization  Discussed VBCI  TOC program and weekly calls to patient to assess condition/status, medication management  and provide support/education as indicated . Patient  voiced understanding and declined enrollment in the 30-day TOC Program at this time .   He is doing well at home , He has family support is able to drive and will schedule follow-up visits and is waiting to hear from Outpatient rehab   The Patient  and / or Caregiver  has been provided with contact information for the care management team and has been advised to call with any health-related questions or concerns. Patient was encouraged to Contact PCP with any questions or concerns regarding ongoing medical care, any difficulty obtaining or picking up prescriptions, any changes or worsening in condition including signs / symptoms not relieved  with interventions Patient had no additional questions or concerns at this time.     Susa Loffler , BSN, RN [facility]   VBCI-Population Health RN Care Manager Direct Dial [phone]  Fax: [phone] Website: [email or web address]

## 2023-05-22 NOTE — Transitions of Care (Post Inpatient/ED Visit) (Deleted)
   05/22/2023  Name: Kristopher Davis MRN: [id number] DOB: Dec 03, 1955  {[id number]}

## 2023-05-24 ENCOUNTER — Other Ambulatory Visit (HOSPITAL_COMMUNITY): Payer: Self-pay

## 2023-05-30 ENCOUNTER — Ambulatory Visit: Attending: Family Medicine | Admitting: Speech Pathology

## 2023-05-30 ENCOUNTER — Ambulatory Visit

## 2023-05-30 DIAGNOSIS — R4701 Aphasia: Secondary | ICD-10-CM | POA: Insufficient documentation

## 2023-05-30 NOTE — Therapy (Incomplete)
 OUTPATIENT SPEECH LANGUAGE PATHOLOGY APHASIA EVALUATION   Patient Name: Kristopher Davis MRN: [id number] DOB:May 31, 1955, 68 y.o., male Today's Date: 05/30/2023  PCP: Alba Cory, MD REFERRING PROVIDER: Alba Cory, MD   End of Session - 05/30/23 1434     Visit Number 1    SLP Start Time 1400    SLP Stop Time  1420    SLP Time Calculation (min) 20 min    Activity Tolerance Patient tolerated treatment well             Past Medical History:  Diagnosis Date   Decreased libido 02/01/2007   Hypertension    Past Surgical History:  Procedure Laterality Date   COLONOSCOPY WITH PROPOFOL N/A 02/08/2022   Procedure: COLONOSCOPY WITH PROPOFOL;  Surgeon: Wyline Mood, MD;  Location: [facility] END[facility]COPY;  Service: Gastroenterology;  Laterality: N/A;   IR CT HEAD LTD  05/16/2023   IR PERCUTANEOUS ART THROMBECTOMY/INFUSION INTRACRANIAL INC DIAG ANGIO  05/16/2023   LUMBAR LAMINECTOMY     3 surgeries   MECHANICAL THROMBECTOMY WITH AORTOGRAM AND INTERVENTION  08/2022   [facility]   RADIOLOGY WITH ANESTHESIA N/A 05/16/2023   Procedure: RADIOLOGY WITH ANESTHESIA;  Surgeon: Radiologist, Medication, MD;  Location: [facility] OR;  Service: Radiology;  Laterality: N/A;   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 09/16/2022   History of pulmonary embolism 09/16/2022   B12 deficiency 07/13/2022   Adenomatous polyp of colon 02/08/2022   Cognitive dysfunction 08/19/2021   Vitamin B1 deficiency 08/19/2021   Folate deficiency 08/19/2021   Radiculitis of leg 08/19/2021   Erectile dysfunction 12/04/2019   Hyperlipidemia  12/03/2019   Aortic atherosclerosis (HCC) 08/24/2017   Lipoma 10/28/2009   Disease of hair and hair follicles 07/28/2009   Benign essential HTN 01/07/2009   Decreased libido 02/01/2007   CD (contact dermatitis) 10/10/2006    ONSET DATE: 05/16/2023   REFERRING DIAG: W09.811 (ICD-10-CM) - Cerebral infarction due to unspecified occlusion or stenosis of left middle cerebral artery   THERAPY DIAG:  Aphasia  Rationale for Evaluation and Treatment Rehabilitation  SUBJECTIVE:   SUBJECTIVE STATEMENT: Pt pleasant, conversant Pt accompanied by: self  PERTINENT HISTORY and DIAGNOSTIC FINDINGS: Pt is a 68 year old male with past medical history of cognitive dysfunction d/t vitamin B1 deficitiency, folate deficiency (08/19/2021) with documentation of medication noncompliance including medication for HTN. While hospitalized for PE at [facility] (07/19/202) pt has several episodes of sundowning. Historically, referrals to psychiatary were turned down by pt. Pt [facility] ED on 05/16/2023 for ischemic stroke. MRI revealed Patchy small volume restricted diffusion involving the left insula,  overlying left frontal lobe, and left temporal occipital region, consistent with acute left MCA distribution infarct. Small focus of associated susceptibility artifact at the left temporal occipital region consistent with mild petechial blood products (series 12, image 40).  Pt underwent thrombectomry at [facility].  Recent diagnosis of ETOH abuse (04/30/2023) were added to pt's active problem list.   PAIN:  Are you having pain? No  FALLS: Has patient fallen in last 6 months?  No  LIVING ENVIRONMENT: Lives with: lives with their spouse Lives in: House/apartment  PLOF:  Level of assistance: Independent with ADLs, Independent with IADLs Employment: Part-time employment   PATIENT GOALS    complete speech language assessment   OBJECTIVE:  COGNITION: Overall cognitive status: No family/caregiver present to  determine baseline cognitive functioning  AUDITORY COMPREHENSION: Overall auditory comprehension: Appears intact YES/NO questions: Appears intact Following directions: Appears intact Conversation: Complex   READING COMPREHENSION: Intact  EXPRESSION: verbal  VERBAL EXPRESSION: Overall verbal expression: Appears intact Level of generative/spontaneous verbalization: conversation Automatic speech: name: intact and social response: intact  Repetition: Appears intact Naming: Responsive: 76-100%, Confrontation: 76-100%, Convergent: 76-100%, and Divergent: 76-100% Pragmatics: Appears intact Comments: pt with fluent communication abilities Non-verbal means of communication: N/A  WRITTEN EXPRESSION: Dominant hand: right  Written expression: Appears intact  MOTOR SPEECH: Overall motor speech: Appears intact Respiration: diaphragmatic/abdominal breathing Phonation: normal Resonance: WFL Articulation: Appears intact Intelligibility: Intelligible Motor planning: Appears intact  ORAL MOTOR EXAMINATION Facial : WFL Lingual: WFL Velum: WFL Mandible: WFL Cough: WFL Voice: WFL   PATIENT EDUCATION: Education details: results of this assessment, ST POC Person educated: Patient Education method: Explanation Education comprehension: verbalized understanding   ASSESSMENT:  CLINICAL IMPRESSION: Patient is a 68 y.o. male who was seen today for a speech language evaluation d/t recent stroke. Pt presents with vastly improved expressive and receptive language abilities.   Pt arrived to evaluation by himself. He reports no residual deficits from most recent CVA. His expressive language abilities are fluent and free of word finding deficits. His receptive language abilities appeared intact with good ability to answer semi-complex yes/no questions and follow 2-step directions. Pt also provides that he drove himself to evaluation and that he has returned to driving for his part-time job for [employer]. At this time, pt no longer appears to be experiencing any acute aphasia d/t CVA. No further services are indicated at this time.  Marland Kitchen   PLAN: Pt presents with resolved cognitive communication deficits. No further services are needed.     Otilia Kareem B. Dreama Saa, M.S., CCC-SLP, Tree surgeon Certified Brain Injury Specialist [facility]  [facility] Rehabilitation Services Office [phone] Ascom [phone] Fax [phone]

## 2023-05-31 ENCOUNTER — Other Ambulatory Visit: Payer: Self-pay | Admitting: Family Medicine

## 2023-06-05 ENCOUNTER — Ambulatory Visit: Admitting: Speech Pathology

## 2023-06-05 ENCOUNTER — Ambulatory Visit: Admitting: Occupational Therapy

## 2023-06-08 ENCOUNTER — Encounter: Admitting: Speech Pathology

## 2023-06-08 ENCOUNTER — Encounter: Admitting: Occupational Therapy

## 2023-06-12 ENCOUNTER — Other Ambulatory Visit: Payer: Self-pay | Admitting: Family Medicine

## 2023-06-12 DIAGNOSIS — I10 Essential (primary) hypertension: Secondary | ICD-10-CM

## 2023-06-13 ENCOUNTER — Other Ambulatory Visit: Payer: Self-pay | Admitting: Family Medicine

## 2023-06-13 ENCOUNTER — Encounter: Admitting: Occupational Therapy

## 2023-06-13 ENCOUNTER — Encounter: Admitting: Speech Pathology

## 2023-06-13 DIAGNOSIS — I10 Essential (primary) hypertension: Secondary | ICD-10-CM

## 2023-06-15 ENCOUNTER — Encounter: Admitting: Speech Pathology

## 2023-06-15 ENCOUNTER — Encounter: Admitting: Occupational Therapy

## 2023-06-20 ENCOUNTER — Encounter: Admitting: Speech Pathology

## 2023-06-20 ENCOUNTER — Encounter: Admitting: Occupational Therapy

## 2023-06-22 ENCOUNTER — Encounter: Admitting: Occupational Therapy

## 2023-06-22 ENCOUNTER — Encounter: Admitting: Speech Pathology

## 2023-06-27 ENCOUNTER — Encounter: Admitting: Speech Pathology

## 2023-06-27 ENCOUNTER — Encounter: Admitting: Occupational Therapy

## 2023-06-29 ENCOUNTER — Encounter: Admitting: Occupational Therapy

## 2023-06-29 ENCOUNTER — Encounter: Admitting: Speech Pathology

## 2023-07-04 ENCOUNTER — Encounter: Admitting: Speech Pathology

## 2023-07-04 ENCOUNTER — Encounter: Admitting: Occupational Therapy

## 2023-07-06 ENCOUNTER — Encounter: Admitting: Speech Pathology

## 2023-07-06 ENCOUNTER — Encounter: Admitting: Occupational Therapy

## 2023-07-08 ENCOUNTER — Other Ambulatory Visit: Payer: Self-pay | Admitting: Family Medicine

## 2023-07-11 ENCOUNTER — Encounter: Admitting: Speech Pathology

## 2023-07-11 ENCOUNTER — Encounter: Admitting: Occupational Therapy

## 2023-07-13 ENCOUNTER — Encounter: Admitting: Occupational Therapy

## 2023-07-13 ENCOUNTER — Encounter: Admitting: Speech Pathology

## 2023-07-18 ENCOUNTER — Encounter: Admitting: Occupational Therapy

## 2023-07-18 ENCOUNTER — Encounter: Admitting: Speech Pathology

## 2023-07-19 ENCOUNTER — Ambulatory Visit: Admitting: Family Medicine

## 2023-07-19 ENCOUNTER — Encounter: Payer: Self-pay | Admitting: Family Medicine

## 2023-07-19 VITALS — BP 132/80 | HR 89 | Temp 98.1°F | Resp 18 | Ht 76.0 in | Wt 212.5 lb

## 2023-07-19 DIAGNOSIS — Z86718 Personal history of other venous thrombosis and embolism: Secondary | ICD-10-CM

## 2023-07-19 DIAGNOSIS — Z86711 Personal history of pulmonary embolism: Secondary | ICD-10-CM

## 2023-07-19 DIAGNOSIS — J069 Acute upper respiratory infection, unspecified: Secondary | ICD-10-CM | POA: Diagnosis not present

## 2023-07-19 DIAGNOSIS — N529 Male erectile dysfunction, unspecified: Secondary | ICD-10-CM

## 2023-07-19 DIAGNOSIS — R058 Other specified cough: Secondary | ICD-10-CM

## 2023-07-19 DIAGNOSIS — I10 Essential (primary) hypertension: Secondary | ICD-10-CM | POA: Diagnosis not present

## 2023-07-19 DIAGNOSIS — F09 Unspecified mental disorder due to known physiological condition: Secondary | ICD-10-CM

## 2023-07-19 DIAGNOSIS — Z8673 Personal history of transient ischemic attack (TIA), and cerebral infarction without residual deficits: Secondary | ICD-10-CM

## 2023-07-19 DIAGNOSIS — I700 Atherosclerosis of aorta: Secondary | ICD-10-CM

## 2023-07-19 DIAGNOSIS — E538 Deficiency of other specified B group vitamins: Secondary | ICD-10-CM

## 2023-07-19 DIAGNOSIS — E519 Thiamine deficiency, unspecified: Secondary | ICD-10-CM

## 2023-07-19 DIAGNOSIS — Q2112 Patent foramen ovale: Secondary | ICD-10-CM

## 2023-07-19 MED ORDER — APIXABAN 5 MG PO TABS: 5.0000 mg | ORAL_TABLET | Freq: Two times a day (BID) | ORAL | 0 refills | Status: DC

## 2023-07-19 MED ORDER — AMLODIPINE BESYLATE 2.5 MG PO TABS: 2.5000 mg | ORAL_TABLET | Freq: Every day | ORAL | 0 refills | Status: DC

## 2023-07-19 MED ORDER — GUAIFENESIN ER 600 MG PO TB12: 600.0000 mg | ORAL_TABLET | Freq: Two times a day (BID) | ORAL | 0 refills | Status: DC

## 2023-07-19 MED ORDER — BENZONATATE 100 MG PO CAPS: 100.0000 mg | ORAL_CAPSULE | Freq: Three times a day (TID) | ORAL | 0 refills | Status: DC | PRN

## 2023-07-19 MED ORDER — TADALAFIL 20 MG PO TABS: 20.0000 mg | ORAL_TABLET | ORAL | 2 refills | Status: AC | PRN

## 2023-07-19 MED ORDER — LOSARTAN POTASSIUM 100 MG PO TABS: 100.0000 mg | ORAL_TABLET | Freq: Every day | ORAL | 0 refills | Status: DC

## 2023-07-19 MED ORDER — HYDROCOD POLI-CHLORPHE POLI ER 10-8 MG/5ML PO SUER: 5.0000 mL | Freq: Every evening | ORAL | 0 refills | Status: DC | PRN

## 2023-07-19 NOTE — Progress Notes (Signed)
 Name: Kristopher Davis   MRN: [id number]    DOB: 10/31/1955   Date:07/19/2023       Progress Note  Subjective  Chief Complaint  Chief Complaint  Patient presents with   Cough    X1 week, granddaughter was sick  having now congestion with a lot of mucus   Nasal Congestion    Discussed the use of AI scribe software for clinical note transcription with the patient, who gave verbal consent to proceed.  History of Present Illness Kristopher Davis is a 68 year old male who presents with a runny nose and cough.  He has been experiencing a runny nose and cough, particularly in the evening and at night when lying down to sleep. The cough began about a week ago and is productive of yellow mucus. He believes he contracted the illness from his granddaughter, who was sick and visiting his home. No fever, chills, or shortness of breath. Nasal discharge is clear during the day, but the mucus he coughs up is yellow.  He has not tried any medications for his symptoms, except for consuming chicken soup and lemon tea for a sore throat. He recalls using a cough drop once. He has not used Mucinex, cough medicines, or nasal sprays recently. He previously used DM cough medicine, but it was not as effective as before.  He has a significant past medical history of a stroke earlier this year, which required hospitalization and intervention. He did not follow up post-discharge due to scheduling issues. He also has a history of pulmonary embolism, deep vein thrombosis (DVT),  he has a patent foramen ovale (PFO), hypertension, and hyperlipidemia. He is on Eliquis  for anticoagulation due to his history of embolic events.  He quit smoking in March 2025 following his stroke and reports minimal alcohol consumption, having had only one beer recently. He is physically active, walking about three and a half miles daily. He takes losartan  and amlodipine  for hypertension   He has aorta atherosclerosis but refuses to take statin therapy    He reports no residual weakness from his stroke but notes a decrease in reading speed and spelling ability. He takes vitamin B12 and B1 supplements twice a week due to previous deficiencies.    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 09/16/2022   History of pulmonary embolism 09/16/2022   B12 deficiency 07/13/2022   Adenomatous polyp of colon 02/08/2022   Cognitive dysfunction 08/19/2021   Vitamin B1 deficiency 08/19/2021   Folate deficiency 08/19/2021   Radiculitis of leg 08/19/2021   Erectile dysfunction 12/04/2019   Hyperlipidemia 12/03/2019   Aortic atherosclerosis (HCC) 08/24/2017   Lipoma 10/28/2009   Disease of hair and hair follicles 07/28/2009   Benign essential HTN 01/07/2009   Decreased libido 02/01/2007   CD (contact dermatitis) 10/10/2006    Social History   Tobacco Use   Smoking status: Some Days    Current packs/day: 0.25    Average packs/day: 0.2 packs/day for 37.7 years (9.4 ttl pk-yrs)    Types: Cigarettes    Start date: 11/07/1985   Smokeless tobacco: Former    Quit  date: 08/21/2017  Substance Use Topics   Alcohol use: Yes    Alcohol/week: 2.0 standard drinks of alcohol    Types: 2 Cans of beer per week     Current Outpatient Medications:    amLODipine  (NORVASC ) 2.5 MG tablet, Take 1 tablet (2.5 mg total) by mouth daily., Disp: 90 tablet, Rfl: 0   apixaban  (ELIQUIS ) 5 MG TABS tablet, Take 1 tablet (5 mg total) by mouth 2 (two) times daily., Disp: 60 tablet, Rfl: 2   losartan  (COZAAR ) 100 MG tablet, Take 1 tablet (100 mg total) by mouth daily., Disp: 30  tablet, Rfl: 2   tadalafil  (CIALIS ) 20 MG tablet, Take 1 tablet (20 mg total) by mouth every other day as needed for erectile dysfunction. Take 1 tab 1 hour prior to intercourse, Disp: 6 tablet, Rfl: 2   cyanocobalamin  (VITAMIN B12) 1000 MCG tablet, Take 1,000 mcg by mouth daily. (Patient not taking: Reported on 05/22/2023), Disp: , Rfl:    nicotine  (NICODERM CQ  - DOSED IN MG/24 HR) 7 mg/24hr patch, Place 1 patch (7 mg total) onto the skin daily. (Patient not taking: Reported on 07/19/2023), Disp: 28 patch, Rfl: 0   thiamine  (VITAMIN B-1) 50 MG tablet, Take 1 tablet (50 mg total) by mouth daily. (Patient not taking: Reported on 05/22/2023), Disp: 100 tablet, Rfl: 1  Allergies  Allergen Reactions   Levofloxacin  In D5w     hallucinations    ROS  Ten systems reviewed and is negative except as mentioned in HPI    Objective  Vitals:   07/19/23 1524  BP: 132/80  Pulse: 89  Resp: 18  Temp: 98.1 F (36.7 C)  TempSrc: Oral  SpO2: 99%  Weight: 212 lb 8 oz (96.4 kg)  Height: 6\' 4"  (1.93 m)    Body mass index is 25.87 kg/m.    Physical Exam  CONSTITUTIONAL: Patient appears well-developed and well-nourished. No distress. HEENT: Head atraumatic, normocephalic, neck supple. Nasal mucosa clear. Throat showed some post nasal drainage.  Ears normal. Clear rhinorrhea CARDIOVASCULAR: Normal rate, regular rhythm and normal heart sounds. No murmur heard. No BLE edema. PULMONARY: Effort normal and breath sounds normal. Lungs clear to auscultation. No respiratory distress. ABDOMINAL: There is no tenderness or distention. MUSCULOSKELETAL: Normal gait. Without gross motor or sensory deficit. PSYCHIATRIC: Patient has a normal mood and affect. Behavior is normal. Judgment and thought content normal.  Recent Results (from the past 2160 hours)  CBG monitoring, ED     Status: None   Collection Time: 05/16/23 11:42 AM  Result Value Ref Range   Glucose-Capillary 77 70 - 99 mg/dL    Comment: Glucose  reference range applies only to samples taken after fasting for at least 8 hours.  Protime-INR     Status: None   Collection Time: 05/16/23 11:45 AM  Result Value Ref Range   Prothrombin Time 13.9 11.4 - 15.2 seconds   INR 1.1 0.8 - 1.2    Comment: (NOTE) INR goal varies based on device and disease states. Performed at [facility], [address] [place], [place] [address]   APTT     Status: None   Collection Time: 05/16/23 11:45 AM  Result Value Ref Range   aPTT 30 24 - 36 seconds    Comment: Performed at [facility], [address] [place], [place] [address]  CBC     Status: None   Collection Time: 05/16/23 11:45 AM  Result Value Ref Range   WBC 7.2 4.0 - 10.5  K/uL   RBC 4.49 4.22 - 5.81 MIL/uL   Hemoglobin 13.7 13.0 - 17.0 g/dL   HCT 40.9 81.1 - 91.4 %   MCV 90.9 80.0 - 100.0 fL   MCH 30.5 26.0 - 34.0 pg   MCHC 33.6 30.0 - 36.0 g/dL   RDW 78.2 95.6 - 21.3 %   Platelets 234 150 - 400 K/uL   nRBC 0.0 0.0 - 0.2 %    Comment: Performed at [facility], [address] [place], [place] [address]  Differential     Status: None   Collection Time: 05/16/23 11:45 AM  Result Value Ref Range   Neutrophils Relative % 74 %   Neutro Abs 5.3 1.7 - 7.7 K/uL   Lymphocytes Relative 20 %   Lymphs Abs 1.4 0.7 - 4.0 K/uL   Monocytes Relative 6 %   Monocytes Absolute 0.4 0.1 - 1.0 K/uL   Eosinophils Relative 0 %   Eosinophils Absolute 0.0 0.0 - 0.5 K/uL   Basophils Relative 0 %   Basophils Absolute 0.0 0.0 - 0.1 K/uL   Immature Granulocytes 0 %   Abs Immature Granulocytes 0.01 0.00 - 0.07 K/uL    Comment: Performed at [facility], [address] [place], [place] [address]  Comprehensive metabolic panel     Status: Abnormal   Collection Time: 05/16/23 11:45 AM  Result Value Ref Range   Sodium 140 135 - 145 mmol/L   Potassium 3.7 3.5 - 5.1 mmol/L   Chloride 107 98 - 111 mmol/L   CO2 24 22 - 32 mmol/L   Glucose, Bld 85 70 - 99 mg/dL     Comment: Glucose reference range applies only to samples taken after fasting for at least 8 hours.   BUN 13 8 - 23 mg/dL   Creatinine, Ser 2.95 0.61 - 1.24 mg/dL   Calcium 8.8 (L) 8.9 - 10.3 mg/dL   Total Protein 7.0 6.5 - 8.1 g/dL   Albumin 3.6 3.5 - 5.0 g/dL   AST 23 15 - 41 U/L   ALT 12 0 - 44 U/L   Alkaline Phosphatase 38 38 - 126 U/L   Total Bilirubin 0.8 0.0 - 1.2 mg/dL   GFR, Estimated >28 >41 mL/min    Comment: (NOTE) Calculated using the CKD-EPI Creatinine Equation (2021)    Anion gap 9 5 - 15    Comment: Performed at [facility], [address] [place], [place] [address]  Ethanol     Status: Abnormal   Collection Time: 05/16/23 11:45 AM  Result Value Ref Range   Alcohol, Ethyl (B) 89 (H) <10 mg/dL    Comment: (NOTE) Lowest detectable limit for serum alcohol is 10 mg/dL.  For medical purposes only. Performed at [facility], [address] [place], [place] [address]   Resp panel by RT-PCR (RSV, Flu A&B, Covid) Anterior Nasal Swab     Status: None   Collection Time: 05/16/23 12:14 PM   Specimen: Anterior Nasal Swab  Result Value Ref Range   SARS Coronavirus 2 by RT PCR NEGATIVE NEGATIVE    Comment: (NOTE) SARS-CoV-2 target nucleic acids are NOT DETECTED.  The SARS-CoV-2 RNA is generally detectable in upper respiratory specimens during the acute phase of infection. The lowest concentration of SARS-CoV-2 viral copies this assay can detect is 138 copies/mL. A negative result does not preclude SARS-Cov-2 infection and should not be used as the sole basis for treatment or other patient management decisions. A negative result may occur  with  improper specimen collection/handling, submission of specimen other than nasopharyngeal swab, presence of viral mutation(s) within the areas targeted by this assay, and inadequate number of viral copies(<138 copies/mL). A negative result must be combined with clinical observations, patient history, and  epidemiological information. The expected result is Negative.  Fact Sheet for Patients:  BloggerCourse.com  Fact Sheet for Healthcare Providers:  SeriousBroker.it  This test is no t yet approved or cleared by the [place]  FDA and  has been authorized for detection and/or diagnosis of SARS-CoV-2 by FDA under an Emergency Use Authorization (EUA). This EUA will remain  in effect (meaning this test can be used) for the duration of the COVID-19 declaration under Section 564(b)(1) of the Act, 21 U.S.C.section 360bbb-3(b)(1), unless the authorization is terminated  or revoked sooner.       Influenza A by PCR NEGATIVE NEGATIVE   Influenza B by PCR NEGATIVE NEGATIVE    Comment: (NOTE) The Xpert Xpress SARS-CoV-2/FLU/RSV plus assay is intended as an aid in the diagnosis of influenza from Nasopharyngeal swab specimens and should not be used as a sole basis for treatment. Nasal washings and aspirates are unacceptable for Xpert Xpress SARS-CoV-2/FLU/RSV testing.  Fact Sheet for Patients: BloggerCourse.com  Fact Sheet for Healthcare Providers: SeriousBroker.it  This test is not yet approved or cleared by the [place]  FDA and has been authorized for detection and/or diagnosis of SARS-CoV-2 by FDA under an Emergency Use Authorization (EUA). This EUA will remain in effect (meaning this test can be used) for the duration of the COVID-19 declaration under Section 564(b)(1) of the Act, 21 U.S.C. section 360bbb-3(b)(1), unless the authorization is terminated or revoked.     Resp Syncytial Virus by PCR NEGATIVE NEGATIVE    Comment: (NOTE) Fact Sheet for Patients: BloggerCourse.com  Fact Sheet for Healthcare Providers: SeriousBroker.it  This test is not yet approved or cleared by the [place]  FDA and has been authorized for  detection and/or diagnosis of SARS-CoV-2 by FDA under an Emergency Use Authorization (EUA). This EUA will remain in effect (meaning this test can be used) for the duration of the COVID-19 declaration under Section 564(b)(1) of the Act, 21 U.S.C. section 360bbb-3(b)(1), unless the authorization is terminated or revoked.  Performed at [facility], [address] [place], [place] [address]   MRSA Next Gen by PCR, Nasal     Status: None   Collection Time: 05/16/23  3:49 PM   Specimen: Nasal Mucosa; Nasal Swab  Result Value Ref Range   MRSA by PCR Next Gen NOT DETECTED NOT DETECTED    Comment: (NOTE) The GeneXpert MRSA Assay (FDA approved for NASAL specimens only), is one component of a comprehensive MRSA colonization surveillance program. It is not intended to diagnose MRSA infection nor to guide or monitor treatment for MRSA infections. Test performance is not FDA approved in patients less than 76 years old. Performed at [facility], [address], [place], [place] [address]   HIV Antibody (routine testing w rflx)     Status: None   Collection Time: 05/16/23  3:56 PM  Result Value Ref Range   HIV Screen 4th Generation wRfx Non Reactive Non Reactive    Comment: Performed at [facility], [address], [place], [place] [address]  Hemoglobin A1c     Status: Abnormal   Collection Time: 05/16/23  3:56 PM  Result Value Ref Range   Hgb A1c MFr Bld 4.5 (L) 4.8 - 5.6 %  Comment: (NOTE) Pre diabetes:          5.7%-6.4%  Diabetes:              >6.4%  Glycemic control for   <7.0% adults with diabetes    Mean Plasma Glucose 82.45 mg/dL    Comment: Performed at [facility], [address] [place], [place] [address]  Rapid urine drug screen (hospital performed)     Status: None   Collection Time: 05/17/23  4:05 AM  Result Value Ref Range   Opiates NONE DETECTED NONE DETECTED   Cocaine NONE DETECTED NONE DETECTED   Benzodiazepines NONE DETECTED NONE  DETECTED   Amphetamines NONE DETECTED NONE DETECTED   Tetrahydrocannabinol NONE DETECTED NONE DETECTED   Barbiturates NONE DETECTED NONE DETECTED    Comment: (NOTE) DRUG SCREEN FOR MEDICAL PURPOSES ONLY.  IF CONFIRMATION IS NEEDED FOR ANY PURPOSE, NOTIFY LAB WITHIN 5 DAYS.  LOWEST DETECTABLE LIMITS FOR URINE DRUG SCREEN Drug Class                     Cutoff (ng/mL) Amphetamine and metabolites    1000 Barbiturate and metabolites    200 Benzodiazepine                 200 Opiates and metabolites        300 Cocaine and metabolites        300 THC                            50 Performed at [facility], [address], [place], [place] [address]   Lipid panel     Status: None   Collection Time: 05/17/23  5:09 AM  Result Value Ref Range   Cholesterol 150 0 - 200 mg/dL   Triglycerides 74 <098 mg/dL   HDL 71 >11 mg/dL   Total CHOL/HDL Ratio 2.1 RATIO   VLDL 15 0 - 40 mg/dL   LDL Cholesterol 64 0 - 99 mg/dL    Comment:        Total Cholesterol/HDL:CHD Risk Coronary Heart Disease Risk Table                     Men   Women  1/2 Average Risk   3.4   3.3  Average Risk       5.0   4.4  2 X Average Risk   9.6   7.1  3 X Average Risk  23.4   11.0        Use the calculated Patient Ratio above and the CHD Risk Table to determine the patient's CHD Risk.        ATP III CLASSIFICATION (LDL):  <100     mg/dL   Optimal  914-782  mg/dL   Near or Above                    Optimal  130-159  mg/dL   Borderline  956-213  mg/dL   High  >086     mg/dL   Very High Performed at [facility], [address] [place], [place] [address]   CBC with Differential/Platelet     Status: Abnormal   Collection Time: 05/17/23  5:09 AM  Result Value Ref Range   WBC 4.5 4.0 - 10.5 K/uL   RBC 4.17 (L) 4.22 - 5.81 MIL/uL   Hemoglobin 12.8 (L) 13.0 -  17.0 g/dL   HCT 04.5 (L) 40.9 - 81.1 %   MCV 90.6 80.0 - 100.0 fL   MCH 30.7 26.0 - 34.0 pg   MCHC 33.9 30.0 - 36.0 g/dL   RDW 91.4 78.2 - 95.6  %   Platelets 201 150 - 400 K/uL   nRBC 0.0 0.0 - 0.2 %   Neutrophils Relative % 61 %   Neutro Abs 2.7 1.7 - 7.7 K/uL   Lymphocytes Relative 27 %   Lymphs Abs 1.2 0.7 - 4.0 K/uL   Monocytes Relative 10 %   Monocytes Absolute 0.5 0.1 - 1.0 K/uL   Eosinophils Relative 1 %   Eosinophils Absolute 0.1 0.0 - 0.5 K/uL   Basophils Relative 1 %   Basophils Absolute 0.0 0.0 - 0.1 K/uL   Immature Granulocytes 0 %   Abs Immature Granulocytes 0.01 0.00 - 0.07 K/uL    Comment: Performed at [facility], [address], [place], [place] [address]  Basic metabolic panel     Status: Abnormal   Collection Time: 05/17/23  5:09 AM  Result Value Ref Range   Sodium 137 135 - 145 mmol/L   Potassium 3.2 (L) 3.5 - 5.1 mmol/L   Chloride 106 98 - 111 mmol/L   CO2 23 22 - 32 mmol/L   Glucose, Bld 72 70 - 99 mg/dL    Comment: Glucose reference range applies only to samples taken after fasting for at least 8 hours.   BUN 8 8 - 23 mg/dL   Creatinine, Ser 6.57 0.61 - 1.24 mg/dL   Calcium 7.9 (L) 8.9 - 10.3 mg/dL   GFR, Estimated >84 >69 mL/min    Comment: (NOTE) Calculated using the CKD-EPI Creatinine Equation (2021)    Anion gap 8 5 - 15    Comment: Performed at [facility], [address] [place], [place] [address]  ECHOCARDIOGRAM COMPLETE     Status: None   Collection Time: 05/17/23  1:49 PM  Result Value Ref Range   Weight 4,448 oz   Height 76 in   BP 141/91 mmHg   S' Lateral 3.50 cm   AR max vel 3.76 cm2   AV Peak grad 3.5 mmHg   Ao pk vel 0.94 m/s   Area-P 1/2 2.80 cm2   Est EF 60 - 65%       Assessment & Plan Acute bronchitis Acute bronchitis likely viral with productive cough and rhinorrhea. Oxygenation adequate. Eliquis  use requires careful medication selection. - Prescribe Mucinex for expectoration. - Prescribe hydrocodone -containing cough suppressant for nocturnal use. - Recommend saline nasal spray for congestion. - Advise warm tea with garlic and chicken noodle soup  for relief. - Instruct to contact clinic if symptoms persist beyond one week for potential antibiotic therapy.  Stroke Major stroke in March 2025 with left MCA territory infarct. No current sequelae but slower reading and spelling difficulties. - Schedule follow-up in three months to review stroke history and management.  Pulmonary embolism and DVT Pulmonary embolism and DVT with ongoing Eliquis  therapy due to PFO, stroke, and embolic events. - Continue Eliquis  indefinitely.  Hypertension Hypertension well-controlled with current regimen. - Continue losartan  100 mg daily. - Continue amlodipine  2.5 mg daily.  Hyperlipidemia Hyperlipidemia with aortic atherosclerosis. Declines statin therapy despite recommendation.  B12 and B1 deficiency B12 and B1 deficiency. No recent alcohol consumption. - Continue B12 supplementation twice weekly.  Smoking cessation Former smoker, quit in March 2025.

## 2023-07-20 ENCOUNTER — Encounter: Admitting: Occupational Therapy

## 2023-07-20 ENCOUNTER — Encounter: Admitting: Speech Pathology

## 2023-07-25 ENCOUNTER — Encounter: Admitting: Occupational Therapy

## 2023-07-25 ENCOUNTER — Encounter: Admitting: Speech Pathology

## 2023-07-27 ENCOUNTER — Encounter: Admitting: Speech Pathology

## 2023-07-27 ENCOUNTER — Encounter: Admitting: Occupational Therapy

## 2023-08-01 ENCOUNTER — Encounter: Admitting: Speech Pathology

## 2023-08-01 ENCOUNTER — Encounter: Admitting: Occupational Therapy

## 2023-08-03 ENCOUNTER — Encounter: Admitting: Speech Pathology

## 2023-08-03 ENCOUNTER — Encounter: Admitting: Occupational Therapy

## 2023-08-08 ENCOUNTER — Encounter: Admitting: Speech Pathology

## 2023-08-08 ENCOUNTER — Encounter: Admitting: Occupational Therapy

## 2023-08-10 ENCOUNTER — Encounter: Admitting: Speech Pathology

## 2023-08-10 ENCOUNTER — Encounter: Admitting: Occupational Therapy

## 2023-08-15 ENCOUNTER — Encounter: Admitting: Speech Pathology

## 2023-08-15 ENCOUNTER — Encounter: Admitting: Occupational Therapy

## 2023-08-17 ENCOUNTER — Encounter: Admitting: Occupational Therapy

## 2023-08-17 ENCOUNTER — Encounter: Admitting: Speech Pathology

## 2023-08-18 ENCOUNTER — Other Ambulatory Visit (HOSPITAL_COMMUNITY): Payer: Self-pay

## 2023-08-22 ENCOUNTER — Encounter: Admitting: Occupational Therapy

## 2023-08-22 ENCOUNTER — Encounter: Admitting: Speech Pathology

## 2023-08-24 ENCOUNTER — Encounter: Admitting: Speech Pathology

## 2023-08-24 ENCOUNTER — Encounter: Admitting: Occupational Therapy

## 2023-08-28 ENCOUNTER — Telehealth: Payer: Self-pay

## 2023-08-28 NOTE — Patient Outreach (Signed)
 First telephone outreach attempt to obtain mRS. No answer. Left message for returned call.  [facility]  Population Health Care Management Assistant  Direct Dial: [phone]  Fax: [phone] Website: [email or web address]

## 2023-08-29 ENCOUNTER — Encounter: Admitting: Occupational Therapy

## 2023-08-29 ENCOUNTER — Encounter: Admitting: Speech Pathology

## 2023-08-30 ENCOUNTER — Telehealth: Payer: Self-pay

## 2023-08-30 NOTE — Patient Outreach (Signed)
 Second telephone outreach attempt to obtain mRS. No answer. Left message for returned call.  [facility]  Population Health Care Management Assistant  Direct Dial: [phone]  Fax: [phone] Website: [email or web address]

## 2023-08-31 ENCOUNTER — Encounter: Admitting: Speech Pathology

## 2023-08-31 ENCOUNTER — Encounter: Admitting: Occupational Therapy

## 2023-08-31 ENCOUNTER — Telehealth: Payer: Self-pay

## 2023-08-31 NOTE — Patient Outreach (Signed)
 3 outreach attempts were completed to obtain mRs. mRs could not be obtained because patient never returned my calls. mRs=7    Kristopher Davis [facility] Health Care Management Assistant  Direct Dial: [phone]  Fax: [phone] Website: [email or web address]

## 2023-09-05 ENCOUNTER — Encounter: Admitting: Occupational Therapy

## 2023-09-05 ENCOUNTER — Encounter: Admitting: Speech Pathology

## 2023-09-06 ENCOUNTER — Other Ambulatory Visit: Payer: Self-pay | Admitting: Family Medicine

## 2023-09-06 DIAGNOSIS — I10 Essential (primary) hypertension: Secondary | ICD-10-CM

## 2023-09-07 ENCOUNTER — Encounter: Admitting: Speech Pathology

## 2023-09-07 ENCOUNTER — Encounter: Admitting: Occupational Therapy

## 2023-09-11 ENCOUNTER — Ambulatory Visit: Payer: Medicare HMO | Admitting: Oncology

## 2023-09-11 ENCOUNTER — Other Ambulatory Visit: Payer: Self-pay | Admitting: *Deleted

## 2023-09-11 ENCOUNTER — Other Ambulatory Visit: Payer: Medicare HMO

## 2023-09-11 DIAGNOSIS — Z7901 Long term (current) use of anticoagulants: Secondary | ICD-10-CM

## 2023-09-11 DIAGNOSIS — Z86711 Personal history of pulmonary embolism: Secondary | ICD-10-CM

## 2023-09-12 ENCOUNTER — Encounter: Admitting: Speech Pathology

## 2023-09-12 ENCOUNTER — Encounter: Admitting: Occupational Therapy

## 2023-09-12 ENCOUNTER — Inpatient Hospital Stay: Payer: Medicare HMO | Attending: Family Medicine

## 2023-09-12 ENCOUNTER — Inpatient Hospital Stay: Payer: Medicare HMO | Admitting: Internal Medicine

## 2023-09-12 NOTE — Assessment & Plan Note (Deleted)
 09/16/2022 f- CTA chest showed acute bilateral PE in the left and right main pulmonary artery as well as all lobar branches and numerous segmental branches,   Acute bilateral PE with PFO clot s/p mechanical thrombectomy, unprovoked -Diagnosed 09/16/2022. CTA chest showed acute bilateral PE in the left and right main pulmonary artery as well as all lobar branches and numerous segmental branches, suspicion for clot in PFO.  Evidence of heart strain.  Vascular surgery was consulted which recommended transfer to tertiary care for CT surgery evaluation in setting of PFO clot. S/p mechanical thrombectomy at [facility].  He was started on Eliquis .   - He stopped Eliquis  in mid December.  Resumed on 03/09/2023 when he went for routine appointment with his primary Dr. Glenard.    -Discussed with the patient that for unprovoked PE, first event the recommended duration of anticoagulation is between 6 to 12 months.  Because he had a massive extensive blood clot would prefer 12 months of anticoagulation with Eliquis .  He is tolerating it well.  Will obtain hypercoagulable workup as below.  If positive, he will need long-term anticoagulation.  I will follow-up with him in 6 months close to the anticipated time of completion which will be 09/16/2023.

## 2023-09-12 NOTE — Progress Notes (Deleted)
 [facility] CONSULT NOTE  Patient Care Team: Sowles, Krichna, MD as PCP - General (Family Medicine) Chrystie Larissa RAMAN, MD as Referring Physician (Dermatology) Rennie Cindy SAUNDERS, MD as Consulting Physician (Oncology)  CHIEF COMPLAINTS/PURPOSE OF CONSULTATION: PE   @onchx   HISTORY OF PRESENTING ILLNESS:  Kristopher Davis 68 y.o.  male pleasant patient with a   edical history of acute massive PE s/p mechanical thrombectomy on 09/16/2022 referred to hematology for hypercoagulable workup.   Patient presented to [facility] ED on 09/16/2022 for shortness of breath on exertion.  CTA chest showed acute bilateral PE in the left and right main pulmonary artery as well as all lobar branches and numerous segmental branches, suspicion for clot in PFO.  Evidence of heart strain.  Vascular surgery was consulted which recommended transfer to tertiary care for CT surgery evaluation in setting of PFO clot. S/p mechanical thrombectomy at [facility].  He was started on Eliquis .   He had some confusion about the duration of Eliquis .  He thought he was supposed to be on it for 3 months only.  He stopped Eliquis  in mid December.  Resumed on 03/09/2023 when he went for routine appointment with his primary Dr. Glenard.    Denies any provoking factors-long car ride, airflight, surgery.  No prior event of PE or DVT.  Denies any family history of PE or DVT.    Review of Systems  Constitutional:  Negative for chills, diaphoresis, fever, malaise/fatigue and weight loss.  HENT:  Negative for nosebleeds and sore throat.   Eyes:  Negative for double vision.  Respiratory:  Negative for cough, hemoptysis, sputum production, shortness of breath and wheezing.   Cardiovascular:  Negative for chest pain, palpitations, orthopnea and leg swelling.  Gastrointestinal:  Negative for abdominal pain, blood in stool, constipation, diarrhea, heartburn, melena, nausea and vomiting.  Genitourinary:  Negative for dysuria, frequency and  urgency.  Musculoskeletal:  Negative for back pain and joint pain.  Skin: Negative.  Negative for itching and rash.  Neurological:  Negative for dizziness, tingling, focal weakness, weakness and headaches.  Endo/Heme/Allergies:  Does not bruise/bleed easily.  Psychiatric/Behavioral:  Negative for depression. The patient is not nervous/anxious and does not have insomnia.     MEDICAL HISTORY:  Past Medical History:  Diagnosis Date   Decreased libido 02/01/2007   Hypertension     SURGICAL HISTORY: Past Surgical History:  Procedure Laterality Date   COLONOSCOPY WITH PROPOFOL  N/A 02/08/2022   Procedure: COLONOSCOPY WITH PROPOFOL ;  Surgeon: Therisa Bi, MD;  Location: [facility] ENDOSCOPY;  Service: Gastroenterology;  Laterality: N/A;   IR CT HEAD LTD  05/16/2023   IR PERCUTANEOUS ART THROMBECTOMY/INFUSION INTRACRANIAL INC DIAG ANGIO  05/16/2023   LUMBAR LAMINECTOMY     3 surgeries   MECHANICAL THROMBECTOMY WITH AORTOGRAM AND INTERVENTION  08/2022   [facility]   RADIOLOGY WITH ANESTHESIA N/A 05/16/2023   Procedure: RADIOLOGY WITH ANESTHESIA;  Surgeon: Radiologist, Medication, MD;  Location: [facility] OR;  Service: Radiology;  Laterality: N/A;    SOCIAL HISTORY: Social History   Socioeconomic History   Marital status: Married    Spouse name: Not on file   Number of children: 2   Years of education: Not on file   Highest education level: Bachelor's degree (e.g., BA, AB, BS)  Occupational History   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   Tobacco Use   Smoking status: Former    Current packs/day: 0.00    Average packs/day:  0.2 packs/day for 37.5 years (9.4 ttl pk-yrs)    Types: Cigarettes    Start date: 11/07/1985    Quit date: 05/17/2023    Years since quitting: 0.3   Smokeless tobacco: Former    Quit date: 08/21/2017  Vaping Use   Vaping status: Never Used  Substance and Sexual Activity   Alcohol use: Yes    Alcohol/week: 2.0 standard drinks  of alcohol    Types: 2 Cans of beer per week   Drug use: No   Sexual activity: Yes    Partners: Female    Comment: married  Other Topics Concern   Not on file  Social History Narrative   He was a Teacher, 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[employer], 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   Social Drivers of Health   Financial Resource Strain: Low Risk  (03/07/2023)   Overall Financial Resource Strain (CARDIA)    Difficulty of Paying Living Expenses: Not very hard  Food Insecurity: No Food Insecurity (05/22/2023)   Hunger Vital Sign    Worried About Running Out of Food in the Last Year: Never true    Ran Out of Food in the Last Year: Never true  Transportation Needs: No Transportation Needs (05/22/2023)   PRAPARE - Administrator, Civil Service (Medical): No    Lack of Transportation (Non-Medical): No  Physical Activity: Sufficiently Active (03/07/2023)   Exercise Vital Sign    Days of Exercise per Week: 3 days    Minutes of Exercise per Session: 140 min  Stress: No Stress Concern Present (03/07/2023)   [employer] of Occupational Health - Occupational Stress Questionnaire    Feeling of Stress : Not at all  Social Connections: Socially Integrated (05/16/2023)   Social Connection and Isolation Panel    Frequency of Communication with Friends and Family: More than three times a week    Frequency of Social Gatherings with Friends and Family: Three times a week    Attends Religious Services: More than 4 times per year    Active Member of Clubs or Organizations: Yes    Attends Banker Meetings: Never    Marital Status: Married  Catering manager Violence: Not At Risk (05/22/2023)   Humiliation, Afraid, Rape, and Kick questionnaire    Fear of Current or Ex-Partner: No    Emotionally Abused: No    Physically Abused: No    Sexually Abused: No    FAMILY HISTORY: Family History  Problem Relation Age of Onset   Cancer Mother    Hypertension Sister    Anemia Daughter     ALLERGIES:  is allergic to levofloxacin   in d5w.  MEDICATIONS:  Current Outpatient Medications  Medication Sig Dispense Refill   amLODipine  (NORVASC ) 2.5 MG tablet Take 1 tablet (2.5 mg total) by mouth daily. 90 tablet 0   apixaban  (ELIQUIS ) 5 MG TABS tablet Take 1 tablet (5 mg total) by mouth 2 (two) times daily. 180 tablet 0   benzonatate  (TESSALON ) 100 MG capsule Take 1 capsule (100 mg total) by mouth 3 (three) times daily as needed for cough. 40 capsule 0   chlorpheniramine-HYDROcodone  (TUSSIONEX) 10-8 MG/5ML Take 5 mLs by mouth at bedtime as needed for cough. 115 mL 0   cyanocobalamin  (VITAMIN B12) 1000 MCG tablet Take 1,000 mcg by mouth daily. (Patient not taking: Reported on 05/22/2023)     guaiFENesin  (MUCINEX ) 600 MG 12 hr tablet Take 1 tablet (600 mg total) by mouth 2 (two) times daily. 60 tablet 0   losartan  (COZAAR ) 100  MG tablet Take 1 tablet (100 mg total) by mouth daily. 90 tablet 0   tadalafil  (CIALIS ) 20 MG tablet Take 1 tablet (20 mg total) by mouth every other day as needed for erectile dysfunction. Take 1 tab 1 hour prior to intercourse 6 tablet 2   thiamine  (VITAMIN B-1) 50 MG tablet Take 1 tablet (50 mg total) by mouth daily. (Patient not taking: Reported on 05/22/2023) 100 tablet 1   No current facility-administered medications for this visit.    PHYSICAL EXAMINATION:   There were no vitals filed for this visit. There were no vitals filed for this visit.  Physical Exam Vitals and nursing note reviewed.  HENT:     Head: Normocephalic and atraumatic.     Mouth/Throat:     Pharynx: Oropharynx is clear.  Eyes:     Extraocular Movements: Extraocular movements intact.     Pupils: Pupils are equal, round, and reactive to light.  Cardiovascular:     Rate and Rhythm: Normal rate and regular rhythm.  Pulmonary:     Comments: Decreased breath sounds bilaterally.  Abdominal:     Palpations: Abdomen is soft.  Musculoskeletal:        General: Normal range of motion.     Cervical back: Normal range of motion.   Skin:    General: Skin is warm.  Neurological:     General: No focal deficit present.     Mental Status: He is alert and oriented to person, place, and time.  Psychiatric:        Behavior: Behavior normal.        Judgment: Judgment normal.     LABORATORY DATA:  I have reviewed the data as listed Lab Results  Component Value Date   WBC 4.5 05/17/2023   HGB 12.8 (L) 05/17/2023   HCT 37.8 (L) 05/17/2023   MCV 90.6 05/17/2023   PLT 201 05/17/2023   Recent Labs    09/16/22 0818 03/14/23 1432 05/16/23 1145 05/17/23 0509  NA 138 138 140 137  K 4.1 4.0 3.7 3.2*  CL 105 106 107 106  CO2 19* 24 24 23   GLUCOSE 188* 95 85 72  BUN 10 10 13 8   CREATININE 1.58* 1.21 1.12 1.18  CALCIUM 8.9 8.9 8.8* 7.9*  GFRNONAA 48* >60 >60 >60  PROT 7.1 7.0 7.0  --   ALBUMIN 3.7 3.9 3.6  --   AST 22 19 23   --   ALT 15 13 12   --   ALKPHOS 52 44 38  --   BILITOT 1.1 1.0 0.8  --     RADIOGRAPHIC STUDIES: I have personally reviewed the radiological images as listed and agreed with the findings in the report. No results found.   No problem-specific Assessment & Plan notes found for this encounter.     Above plan of care was discussed with patient/family in detail.  My contact information was given to the patient/family.       Cindy JONELLE Joe, MD 09/12/2023 12:58 PM

## 2023-09-14 ENCOUNTER — Encounter: Admitting: Occupational Therapy

## 2023-09-14 ENCOUNTER — Encounter: Admitting: Speech Pathology

## 2023-09-19 ENCOUNTER — Encounter: Admitting: Occupational Therapy

## 2023-09-19 ENCOUNTER — Encounter: Admitting: Speech Pathology

## 2023-09-21 ENCOUNTER — Encounter: Admitting: Occupational Therapy

## 2023-09-21 ENCOUNTER — Encounter: Admitting: Speech Pathology

## 2023-10-09 ENCOUNTER — Other Ambulatory Visit: Payer: Self-pay | Admitting: Family Medicine

## 2023-10-09 DIAGNOSIS — I10 Essential (primary) hypertension: Secondary | ICD-10-CM

## 2023-10-23 ENCOUNTER — Other Ambulatory Visit: Payer: Self-pay | Admitting: Family Medicine

## 2023-10-23 DIAGNOSIS — Q2112 Patent foramen ovale: Secondary | ICD-10-CM

## 2023-10-23 DIAGNOSIS — Z86711 Personal history of pulmonary embolism: Secondary | ICD-10-CM

## 2023-10-23 DIAGNOSIS — I10 Essential (primary) hypertension: Secondary | ICD-10-CM

## 2023-10-23 NOTE — Telephone Encounter (Unsigned)
 Copied from CRM [id number]. Topic: Clinical - Medication Refill >> Oct 23, 2023 12:30 PM Zebedee SAUNDERS wrote: Medication: apixaban  (ELIQUIS ) 5 MG TABS tablet, amLODipine  (NORVASC ) 2.5 MG tablet and losartan  (COZAAR ) 100 MG tablet  Has the patient contacted their pharmacy? Yes (Agent: If no, request that the patient contact the pharmacy for the refill. If patient does not wish to contact the pharmacy document the reason why and proceed with request.) (Agent: If yes, when and what did the pharmacy advise?)Pharmacy need PCP approval  This is the patient's preferred pharmacy:  [id number] [place], [place] - [address] AT [facility] OF [address] [place] [address] Phone: [phone] Fax: [phone]  Is this the correct pharmacy for this prescription? Yes If no, delete pharmacy and type the correct one.   Has the prescription been filled recently? Yes  Is the patient out of the medication? Yes  Has the patient been seen for an appointment in the last year OR does the patient have an upcoming appointment? Yes  Can we respond through MyChart? Yes  Agent: Please be advised that Rx refills may take up to 3 business days. We ask that you follow-up with your pharmacy.

## 2023-10-24 ENCOUNTER — Ambulatory Visit: Admitting: Family Medicine

## 2023-10-24 MED ORDER — AMLODIPINE BESYLATE 2.5 MG PO TABS: 2.5000 mg | ORAL_TABLET | Freq: Every day | ORAL | 0 refills | Status: DC

## 2023-10-24 MED ORDER — LOSARTAN POTASSIUM 100 MG PO TABS: 100.0000 mg | ORAL_TABLET | Freq: Every day | ORAL | 0 refills | Status: DC

## 2023-10-24 MED ORDER — APIXABAN 5 MG PO TABS: 5.0000 mg | ORAL_TABLET | Freq: Two times a day (BID) | ORAL | 0 refills | Status: DC

## 2023-10-24 NOTE — Telephone Encounter (Signed)
 Requested Prescriptions  Pending Prescriptions Disp Refills   apixaban  (ELIQUIS ) 5 MG TABS tablet 180 tablet 0    Sig: Take 1 tablet (5 mg total) by mouth 2 (two) times daily.     Hematology:  Anticoagulants - apixaban  Failed - 10/24/2023  3:56 PM      Failed - HGB in normal range and within 360 days    Hemoglobin  Date Value Ref Range Status  05/17/2023 12.8 (L) 13.0 - 17.0 g/dL Final  94/84/7982 85.1 12.6 - 17.7 g/dL Final         Failed - HCT in normal range and within 360 days    HCT  Date Value Ref Range Status  05/17/2023 37.8 (L) 39.0 - 52.0 % Final   Hematocrit  Date Value Ref Range Status  07/13/2015 44.1 37.5 - 51.0 % Final         Passed - PLT in normal range and within 360 days    Platelets  Date Value Ref Range Status  05/17/2023 201 150 - 400 K/uL Final  07/13/2015 225 150 - 379 x10E3/uL Final         Passed - Cr in normal range and within 360 days    Creat  Date Value Ref Range Status  08/23/2022 1.23 0.70 - 1.35 mg/dL Final   Creatinine, Ser  Date Value Ref Range Status  05/17/2023 1.18 0.61 - 1.24 mg/dL Final         Passed - AST in normal range and within 360 days    AST  Date Value Ref Range Status  05/16/2023 23 15 - 41 U/L Final         Passed - ALT in normal range and within 360 days    ALT  Date Value Ref Range Status  05/16/2023 12 0 - 44 U/L Final         Passed - Valid encounter within last 12 months    Recent Outpatient Visits           3 months ago Productive cough   [facility] Glenard Mire, MD       Future Appointments             In 2 days Bernardo Fend, DO [facility]   In 1 month Sowles, Krichna, MD [facility], [facility]             amLODipine  (NORVASC ) 2.5 MG tablet 90 tablet 0    Sig: Take 1 tablet (2.5 mg total) by mouth daily.     Cardiovascular: Calcium Channel Blockers 2 Passed - 10/24/2023  3:56 PM      Passed -  Last BP in normal range    BP Readings from Last 1 Encounters:  07/19/23 132/80         Passed - Last Heart Rate in normal range    Pulse Readings from Last 1 Encounters:  07/19/23 89         Passed - Valid encounter within last 6 months    Recent Outpatient Visits           3 months ago Productive cough   [facility] Glenard Mire, MD       Future Appointments             In 2 days Bernardo Fend, DO [facility], [facility]   In 1 month Glenard Mire, MD [facility], [facility]             losartan  (COZAAR ) 100 MG tablet 90 tablet 0    Sig: Take 1 tablet (100 mg total) by mouth daily.     Cardiovascular:  Angiotensin Receptor Blockers Failed - 10/24/2023  3:56 PM      Failed - K in normal range and within 180 days    Potassium  Date Value Ref Range Status  05/17/2023 3.2 (L) 3.5 - 5.1 mmol/L Final         Passed - Cr in normal range and within 180 days    Creat  Date Value Ref Range Status  08/23/2022 1.23 0.70 - 1.35 mg/dL Final   Creatinine, Ser  Date Value Ref Range Status  05/17/2023 1.18 0.61 - 1.24 mg/dL Final         Passed - Patient is not pregnant      Passed - Last BP in normal range    BP Readings from Last 1 Encounters:  07/19/23 132/80         Passed - Valid encounter within last 6 months    Recent Outpatient Visits           3 months ago Productive cough   [facility] Sowles, Krichna, MD       Future Appointments             In 2 days Bernardo Fend, DO [facility], [facility]   In 1 month Sowles, Krichna, MD [facility]

## 2023-10-26 ENCOUNTER — Other Ambulatory Visit: Payer: Self-pay

## 2023-10-26 ENCOUNTER — Ambulatory Visit (INDEPENDENT_AMBULATORY_CARE_PROVIDER_SITE_OTHER): Admitting: Internal Medicine

## 2023-10-26 ENCOUNTER — Ambulatory Visit

## 2023-10-26 ENCOUNTER — Encounter: Payer: Self-pay | Admitting: Internal Medicine

## 2023-10-26 DIAGNOSIS — Q2112 Patent foramen ovale: Secondary | ICD-10-CM | POA: Diagnosis not present

## 2023-10-26 DIAGNOSIS — Z86711 Personal history of pulmonary embolism: Secondary | ICD-10-CM | POA: Diagnosis not present

## 2023-10-26 DIAGNOSIS — I10 Essential (primary) hypertension: Secondary | ICD-10-CM

## 2023-10-26 MED ORDER — LOSARTAN POTASSIUM 100 MG PO TABS: 100.0000 mg | ORAL_TABLET | Freq: Every day | ORAL | 1 refills | Status: DC

## 2023-10-26 MED ORDER — AMLODIPINE BESYLATE 2.5 MG PO TABS: 2.5000 mg | ORAL_TABLET | Freq: Every day | ORAL | 1 refills | Status: DC

## 2023-10-26 MED ORDER — APIXABAN 5 MG PO TABS: 5.0000 mg | ORAL_TABLET | Freq: Two times a day (BID) | ORAL | 1 refills | Status: DC

## 2023-10-26 NOTE — Progress Notes (Signed)
 Established Patient Office Visit  Subjective   Patient ID: Kristopher Davis, male    DOB: 05-16-1955  Age: 68 y.o. MRN: [id number]  Chief Complaint  Patient presents with   Follow-up    HPI  Patient is here for follow up on chronic medical conditions. He is a patient of Dr. Glenard and this is my first time meeting him.  Discussed the use of AI scribe software for clinical note transcription with the patient, who gave verbal consent to proceed.  History of Present Illness Kristopher Davis is a 68 year old male with hypertension who presents for a follow-up visit.  He feels well and remains active, engaging in yard work. He has not checked his blood pressure recently, but it is usually around 138/140 mmHg at home. He takes amlodipine  2.5 mg and losartan  100 mg for hypertension. He has been on Eliquis  since February or March for blood clots and stroke, taking it twice daily. No abnormal bleeding is present. He generally does not get sick often, except for occasional sore throats during the summer, which resolve quickly.  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 09/16/2022   History of pulmonary embolism 09/16/2022   B12 deficiency 07/13/2022   Adenomatous polyp of colon 02/08/2022   Cognitive dysfunction 08/19/2021   Vitamin B1 deficiency 08/19/2021   Folate deficiency 08/19/2021   Radiculitis of leg 08/19/2021   Erectile  dysfunction 12/04/2019   Hyperlipidemia 12/03/2019   Aortic atherosclerosis (HCC) 08/24/2017   Lipoma 10/28/2009   Disease of hair and hair follicles 07/28/2009   Benign essential HTN 01/07/2009   Decreased libido 02/01/2007   CD (contact dermatitis) 10/10/2006   Past Medical History:  Diagnosis Date   Decreased libido 02/01/2007   Hypertension    Past Surgical History:  Procedure Laterality Date   COLONOSCOPY WITH PROPOFOL  N/A 02/08/2022   Procedure: COLONOSCOPY WITH PROPOFOL ;  Surgeon: Therisa Bi, MD;  Location: [facility] ENDOSCOPY;  Service: Gastroenterology;  Laterality: N/A;   IR CT HEAD LTD  05/16/2023   IR PERCUTANEOUS ART THROMBECTOMY/INFUSION INTRACRANIAL INC DIAG ANGIO  05/16/2023   LUMBAR LAMINECTOMY     3 surgeries   MECHANICAL THROMBECTOMY WITH AORTOGRAM AND INTERVENTION  08/2022   [facility]   RADIOLOGY WITH ANESTHESIA N/A 05/16/2023   Procedure: RADIOLOGY WITH ANESTHESIA;  Surgeon: Radiologist, Medication, MD;  Location: [facility] OR;  Service: Radiology;  Laterality: N/A;   Social History   Tobacco Use   Smoking status: Former    Current packs/day: 0.00    Average packs/day: 0.2 packs/day for 37.5 years (9.4 ttl pk-yrs)    Types: Cigarettes    Start date: 11/07/1985    Quit date: 05/17/2023    Years since quitting: 0.4   Smokeless tobacco: Former    Quit date: 08/21/2017  Vaping Use   Vaping status: Never Used  Substance Use Topics   Alcohol use: Yes    Alcohol/week: 2.0 standard drinks of alcohol    Types:  2 Cans of beer per week   Drug use: No   Social History   Socioeconomic History   Marital status: Married    Spouse name: Not on file   Number of children: 2   Years of education: Not on file   Highest education level: Bachelor's degree (e.g., BA, AB, BS)  Occupational History   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   Tobacco Use   Smoking status: Former    Current packs/day: 0.00    Average packs/day: 0.2  packs/day for 37.5 years (9.4 ttl pk-yrs)    Types: Cigarettes    Start date: 11/07/1985    Quit date: 05/17/2023    Years since quitting: 0.4   Smokeless tobacco: Former    Quit date: 08/21/2017  Vaping Use   Vaping status: Never Used  Substance and Sexual Activity   Alcohol use: Yes    Alcohol/week: 2.0 standard drinks of alcohol    Types: 2 Cans of beer per week   Drug use: No   Sexual activity: Yes    Partners: Female    Comment: married  Other Topics Concern   Not on file  Social History Narrative   He was a Teacher, 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[employer], 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   Social Drivers of Health   Financial Resource Strain: Low Risk  (03/07/2023)   Overall Financial Resource Strain (CARDIA)    Difficulty of Paying Living Expenses: Not very hard  Food Insecurity: No Food Insecurity (05/22/2023)   Hunger Vital Sign    Worried About Running Out of Food in the Last Year: Never true    Ran Out of Food in the Last Year: Never true  Transportation Needs: No Transportation Needs (05/22/2023)   PRAPARE - Administrator, Civil Service (Medical): No    Lack of Transportation (Non-Medical): No  Physical Activity: Sufficiently Active (03/07/2023)   Exercise Vital Sign    Days of Exercise per Week: 3 days    Minutes of Exercise per Session: 140 min  Stress: No Stress Concern Present (03/07/2023)   [employer] of Occupational Health - Occupational Stress Questionnaire    Feeling of Stress : Not at all  Social Connections: Socially Integrated (05/16/2023)   Social Connection and Isolation Panel    Frequency of Communication with Friends and Family: More than three times a week    Frequency of Social Gatherings with Friends and Family: Three times a week    Attends Religious Services: More than 4 times per year    Active Member of Clubs or Organizations: Yes    Attends Banker Meetings: Never    Marital Status: Married  Catering manager Violence: Not At Risk (05/22/2023)   Humiliation,  Afraid, Rape, and Kick questionnaire    Fear of Current or Ex-Partner: No    Emotionally Abused: No    Physically Abused: No    Sexually Abused: No   Family Status  Relation Name Status   Mother  Deceased       cancer   Father  Deceased   Sister 2 Deceased   Brother 3 Deceased   Daughter 1 Alive   Son 1 Alive  No partnership data on file   Family History  Problem Relation Age of Onset   Cancer Mother    Hypertension Sister    Anemia Daughter    Allergies  Allergen Reactions   Levofloxacin  In D5w     hallucinations  Review of Systems  All other systems reviewed and are negative.     Objective:     BP 134/82   Pulse 100   Temp 98.4 F (36.9 C) (Oral)   Resp 16   Ht 6' 4 (1.93 m)   Wt 205 lb 1.6 oz (93 kg)   SpO2 98%   BMI 24.97 kg/m  BP Readings from Last 3 Encounters:  10/26/23 134/82  07/19/23 132/80  05/19/23 132/85   Wt Readings from Last 3 Encounters:  10/26/23 205 lb 1.6 oz (93 kg)  07/19/23 212 lb 8 oz (96.4 kg)  05/17/23 278 lb (126.1 kg)      Physical Exam Constitutional:      Appearance: Normal appearance.  HENT:     Head: Normocephalic and atraumatic.  Eyes:     Conjunctiva/sclera: Conjunctivae normal.  Cardiovascular:     Rate and Rhythm: Normal rate and regular rhythm.  Pulmonary:     Effort: Pulmonary effort is normal.     Breath sounds: Normal breath sounds.  Skin:    General: Skin is warm and dry.  Neurological:     General: No focal deficit present.     Mental Status: He is alert. Mental status is at baseline.  Psychiatric:        Mood and Affect: Mood normal.        Behavior: Behavior normal.      No results found for any visits on 10/26/23.  Last CBC Lab Results  Component Value Date   WBC 4.5 05/17/2023   HGB 12.8 (L) 05/17/2023   HCT 37.8 (L) 05/17/2023   MCV 90.6 05/17/2023   MCH 30.7 05/17/2023   RDW 14.8 05/17/2023   PLT 201 05/17/2023   Last metabolic panel Lab Results  Component Value Date    GLUCOSE 72 05/17/2023   NA 137 05/17/2023   K 3.2 (L) 05/17/2023   CL 106 05/17/2023   CO2 23 05/17/2023   BUN 8 05/17/2023   CREATININE 1.18 05/17/2023   GFRNONAA >60 05/17/2023   CALCIUM 7.9 (L) 05/17/2023   PROT 7.0 05/16/2023   ALBUMIN 3.6 05/16/2023   LABGLOB 3.1 07/13/2015   AGRATIO 1.3 07/13/2015   BILITOT 0.8 05/16/2023   ALKPHOS 38 05/16/2023   AST 23 05/16/2023   ALT 12 05/16/2023   ANIONGAP 8 05/17/2023   Last lipids Lab Results  Component Value Date   CHOL 150 05/17/2023   HDL 71 05/17/2023   LDLCALC 64 05/17/2023   TRIG 74 05/17/2023   CHOLHDL 2.1 05/17/2023   Last hemoglobin A1c Lab Results  Component Value Date   HGBA1C 4.5 (L) 05/16/2023   Last thyroid  functions Lab Results  Component Value Date   TSH 0.97 05/06/2020   Last vitamin D  Lab Results  Component Value Date   VD25OH 27 (L) 10/16/2015   Last vitamin B12 and Folate Lab Results  Component Value Date   VITAMINB12 361 08/23/2022   FOLATE >24.0 08/23/2022      The ASCVD Risk score (Arnett DK, et al., 2019) failed to calculate for the following reasons:   Risk score cannot be calculated because patient has a medical history suggesting prior/existing ASCVD    Assessment & Plan:   Assessment & Plan Essential hypertension Blood pressure 148/80 mmHg, slightly elevated. Discussed maintaining under 140/90 mmHg to reduce stroke risk. - Recheck blood pressure today which was better on recheck.  - If elevated, increase amlodipine  to 5 mg daily. - If normal, continue current regimen  and monitor at home weekly.  History of pulmonary embolism and stroke On Eliquis  since February/March for thromboembolic prevention. No abnormal bleeding reported. - Continue Eliquis  as prescribed.  - losartan  (COZAAR ) 100 MG tablet; Take 1 tablet (100 mg total) by mouth daily.  Dispense: 90 tablet; Refill: 1 - amLODipine  (NORVASC ) 2.5 MG tablet; Take 1 tablet (2.5 mg total) by mouth daily.  Dispense: 90  tablet; Refill: 1 - apixaban  (ELIQUIS ) 5 MG TABS tablet; Take 1 tablet (5 mg total) by mouth 2 (two) times daily.  Dispense: 180 tablet; Refill: 1   Return in about 3 months (around 01/26/2024).    Sharyle Fischer, DO

## 2023-12-05 ENCOUNTER — Ambulatory Visit: Admitting: Family Medicine

## 2023-12-28 ENCOUNTER — Ambulatory Visit (INDEPENDENT_AMBULATORY_CARE_PROVIDER_SITE_OTHER)

## 2023-12-28 DIAGNOSIS — Z0000 Encounter for general adult medical examination without abnormal findings: Secondary | ICD-10-CM

## 2023-12-28 NOTE — Patient Instructions (Addendum)
 Kristopher Davis,  Thank you for taking the time for your Medicare Wellness Visit. I appreciate your continued commitment to your health goals. Please review the care plan we discussed, and feel free to reach out if I can assist you further.  Medicare recommends these wellness visits once per year to help you and your care team stay ahead of potential health issues. These visits are designed to focus on prevention, allowing your provider to concentrate on managing your acute and chronic conditions during your regular appointments.  Please note that Annual Wellness Visits do not include a physical exam. Some assessments may be limited, especially if the visit was conducted virtually. If needed, we may recommend a separate in-person follow-up with your provider.  Ongoing Care Seeing your primary care provider every 3 to 6 months helps us  monitor your health and provide consistent, personalized care.   Referrals If a referral was made during today's visit and you haven't received any updates within two weeks, please contact the referred provider directly to check on the status.  Recommended Screenings:  Health Maintenance  Topic Date Due   Zoster (Shingles) Vaccine (1 of 2) Never done   Pneumococcal Vaccine for age over 62 (1 of 1 - PCV) Never done   Flu Shot  Never done   COVID-19 Vaccine (4 - 2025-26 season) 10/30/2023   DTaP/Tdap/Td vaccine (1 - Tdap) 03/08/2024*   Medicare Annual Wellness Visit  12/27/2024   Colon Cancer Screening  02/08/2029   Hepatitis C Screening  Completed   Meningitis B Vaccine  Aged Out  *Topic was postponed. The date shown is not the original due date.    Advance Care Planning is important because it: Ensures you receive medical care that aligns with your values, goals, and preferences. Provides guidance to your family and loved ones, reducing the emotional burden of decision-making during critical moments.  Vision: Annual vision screenings are recommended for  early detection of glaucoma, cataracts, and diabetic retinopathy. These exams can also reveal signs of chronic conditions such as diabetes and high blood pressure.  Dental: Annual dental screenings help detect early signs of oral cancer, gum disease, and other conditions linked to overall health, including heart disease and diabetes.  Please see the attached documents for additional preventive care recommendations.   NEXT AWV 01/02/25 @ 10:50 AM BY VIDEO

## 2023-12-28 NOTE — Progress Notes (Signed)
 Subjective:   Kristopher Davis is a 68 y.o. who presents for a Medicare Wellness preventive visit.  As a reminder, Annual Wellness Visits don't include a physical exam, and some assessments may be limited, especially if this visit is performed virtually. We may recommend an in-person follow-up visit with your provider if needed.  Visit Complete: Virtual I connected with  [name] on 12/28/23 by a video and audio enabled telemedicine application and verified that I am speaking with the correct person using two identifiers.  Patient Location: Home  Provider Location: Home Office  I discussed the limitations of evaluation and management by telemedicine. The patient expressed understanding and agreed to proceed.  Vital Signs: Because this visit was a virtual/telehealth visit, some criteria may be missing or patient reported. Any vitals not documented were not able to be obtained and vitals that have been documented are patient reported.  VideoError- Librarian, academic were attempted between this provider and patient, however failed, due to patient having technical difficulties OR patient did not have access to video capability.  We continued and completed visit with audio only.   Persons Participating in Visit: Patient.  AWV Questionnaire: No: Patient Medicare AWV questionnaire was not completed prior to this visit.  Cardiac Risk Factors include: advanced age (>20men, >88 women);dyslipidemia;hypertension;male gender     Objective:    There were no vitals filed for this visit. There is no height or weight on file to calculate BMI.     12/28/2023   12:48 PM 05/18/2023    8:00 AM 05/16/2023   12:15 PM 03/14/2023    1:57 PM 09/16/2022    8:16 AM 02/08/2022    8:52 AM 12/26/2019    2:25 PM  Advanced Directives  Does Patient Have a Medical Advance Directive? No No No Yes No No No  Type of Advance Directive    Living will;Healthcare Power of Attorney     Copy  of Healthcare Power of Attorney in Chart?    No - copy requested     Would patient like information on creating a medical advance directive? No - Patient declined No - Patient declined    No - Patient declined No - Patient declined    Current Medications (verified) Outpatient Encounter Medications as of 12/28/2023  Medication Sig   amLODipine  (NORVASC ) 2.5 MG tablet Take 1 tablet (2.5 mg total) by mouth daily.   apixaban  (ELIQUIS ) 5 MG TABS tablet Take 1 tablet (5 mg total) by mouth 2 (two) times daily.   losartan  (COZAAR ) 100 MG tablet Take 1 tablet (100 mg total) by mouth daily.   tadalafil  (CIALIS ) 20 MG tablet Take 1 tablet (20 mg total) by mouth every other day as needed for erectile dysfunction. Take 1 tab 1 hour prior to intercourse   guaiFENesin  (MUCINEX ) 600 MG 12 hr tablet Take 1 tablet (600 mg total) by mouth 2 (two) times daily. (Patient not taking: Reported on 12/28/2023)   No facility-administered encounter medications on file as of 12/28/2023.    Allergies (verified) Levofloxacin  in d5w   History: Past Medical History:  Diagnosis Date   Decreased libido 02/01/2007   Hypertension    Past Surgical History:  Procedure Laterality Date   COLONOSCOPY WITH PROPOFOL  N/A 02/08/2022   Procedure: COLONOSCOPY WITH PROPOFOL ;  Surgeon: Therisa Bi, MD;  Location: [facility] ENDOSCOPY;  Service: Gastroenterology;  Laterality: N/A;   IR CT HEAD LTD  05/16/2023   IR PERCUTANEOUS ART THROMBECTOMY/INFUSION INTRACRANIAL INC DIAG ANGIO  05/16/2023  LUMBAR LAMINECTOMY     3 surgeries   MECHANICAL THROMBECTOMY WITH AORTOGRAM AND INTERVENTION  08/2022   [facility]   RADIOLOGY WITH ANESTHESIA N/A 05/16/2023   Procedure: RADIOLOGY WITH ANESTHESIA;  Surgeon: Radiologist, Medication, MD;  Location: [facility] OR;  Service: Radiology;  Laterality: N/A;   Family History  Problem Relation Age of Onset   Cancer Mother    Hypertension Sister    Anemia Daughter    Social History   Socioeconomic History    Marital status: Married    Spouse name: Not on file   Number of children: 2   Years of education: Not on file   Highest education level: Bachelor's degree (e.g., BA, AB, BS)  Occupational History   Occupation: verifies orders / inspection of products     Comment: makes government social research officer   Tobacco Use   Smoking status: Former    Current packs/day: 0.00    Average packs/day: 0.2 packs/day for 37.5 years (9.4 ttl pk-yrs)    Types: Cigarettes    Start date: 11/07/1985    Quit date: 05/17/2023    Years since quitting: 0.6   Smokeless tobacco: Former    Quit date: 08/21/2017  Vaping Use   Vaping status: Never Used  Substance and Sexual Activity   Alcohol use: Yes    Alcohol/week: 2.0 standard drinks of alcohol    Types: 2 Cans of beer per week   Drug use: No   Sexual activity: Yes    Partners: Female    Comment: married  Other Topics Concern   Not on file  Social History Narrative   He was a teacher, 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[employer], 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   Social Drivers of Health   Financial Resource Strain: Low Risk  (12/28/2023)   Overall Financial Resource Strain (CARDIA)    Difficulty of Paying Living Expenses: Not hard at all  Food Insecurity: No Food Insecurity (12/28/2023)   Hunger Vital Sign    Worried About Running Out of Food in the Last Year: Never true    Ran Out of Food in the Last Year: Never true  Transportation Needs: No Transportation Needs (12/28/2023)   PRAPARE - Administrator, Civil Service (Medical): No    Lack of Transportation (Non-Medical): No  Physical Activity: Sufficiently Active (12/28/2023)   Exercise Vital Sign    Days of Exercise per Week: 3 days    Minutes of Exercise per Session: 110 min  Stress: No Stress Concern Present (12/28/2023)   [employer] of Occupational Health - Occupational Stress Questionnaire    Feeling of Stress: Not at all  Social Connections: Moderately Integrated (12/28/2023)   Social Connection and Isolation Panel    Frequency  of Communication with Friends and Family: More than three times a week    Frequency of Social Gatherings with Friends and Family: Twice a week    Attends Religious Services: More than 4 times per year    Active Member of [employer] or Organizations: No    Attends Banker Meetings: Never    Marital Status: Married    Tobacco Counseling Counseling given: Not Answered    Clinical Intake:  Pre-visit preparation completed: Yes  Pain : No/denies pain     BMI - recorded: 25 Nutritional Status: BMI 25 -29 Overweight Nutritional Risks: None Diabetes: No  Lab Results  Component Value Date   HGBA1C 4.5 (L) 05/16/2023   HGBA1C 4.7 04/19/2019   HGBA1C 4.8 05/22/2018     How often do  you need to have someone help you when you read instructions, pamphlets, or other written materials from your doctor or pharmacy?: 1 - Never  Interpreter Needed?: No  Information entered by :: JHONNIE DAS, LPN   Activities of Daily Living     12/28/2023   12:50 PM 05/18/2023    8:00 AM  In your present state of health, do you have any difficulty performing the following activities:  Hearing? 0 1  Vision? 0 0  Difficulty concentrating or making decisions? 0 0  Walking or climbing stairs? 0   Dressing or bathing? 0   Doing errands, shopping? 0   Preparing Food and eating ? N   Using the Toilet? N   In the past six months, have you accidently leaked urine? N   Do you have problems with loss of bowel control? N   Managing your Medications? N   Managing your Finances? N   Housekeeping or managing your Housekeeping? N     Patient Care Team: Sowles, Krichna, MD as PCP - General (Family Medicine) Chrystie Larissa RAMAN, MD as Referring Physician (Dermatology) Rennie Cindy SAUNDERS, MD as Consulting Physician (Oncology) [facility], [facility]  I have updated your Care Teams any recent Medical Services you may have received from other providers in the past year.     Assessment:   This  is a routine wellness examination for [place].  Hearing/Vision screen Hearing Screening - Comments:: HAS AIDS BUT DOESN'T WEAR Vision Screening - Comments:: READERS-WOODARD-SEEN EVERY YEAR   Goals Addressed             This Visit's Progress    DIET - EAT MORE FRUITS AND VEGETABLES         Depression Screen     12/28/2023   12:46 PM 07/19/2023    3:17 PM 03/09/2023    1:08 PM 02/07/2023   12:49 PM 10/06/2022    8:52 AM 08/23/2022    8:16 AM 07/13/2022   11:19 AM  PHQ 2/9 Scores  PHQ - 2 Score 0 0 0 0 0 0 0  PHQ- 9 Score 0  0 0 0 0 0    Fall Risk     12/28/2023   12:50 PM 07/19/2023    3:17 PM 03/09/2023    1:08 PM 02/07/2023   12:49 PM 10/06/2022    8:52 AM  Fall Risk   Falls in the past year? 0 0 0 0 0  Number falls in past yr: 0 0 0 0   Injury with Fall? 0 0 0 0   Risk for fall due to : No Fall Risks No Fall Risks No Fall Risks No Fall Risks No Fall Risks  Follow up Falls evaluation completed;Falls prevention discussed Falls prevention discussed;Education provided;Falls evaluation completed Falls prevention discussed;Education provided;Falls evaluation completed Falls prevention discussed Falls prevention discussed    MEDICARE RISK AT HOME:  Medicare Risk at Home Any stairs in or around the home?: Yes If so, are there any without handrails?: No Home free of loose throw rugs in walkways, pet beds, electrical cords, etc?: Yes Adequate lighting in your home to reduce risk of falls?: Yes Life alert?: No Use of a cane, walker or w/c?: No Grab bars in the bathroom?: No Shower chair or bench in shower?: No Elevated toilet seat or a handicapped toilet?: No  TIMED UP AND GO:  Was the test performed?  No  Cognitive Function: 6CIT completed        12/28/2023  12:53 PM  6CIT Screen  What Year? 0 points  What month? 0 points  What time? 0 points  Count back from 20 0 points  Months in reverse 0 points  Repeat phrase 0 points  Total Score 0 points     Immunizations Immunization History  Administered Date(s) Administered   PFIZER(Purple Top)SARS-COV-2 Vaccination 05/11/2019, 06/01/2019, 02/10/2020    Screening Tests Health Maintenance  Topic Date Due   Zoster Vaccines- Shingrix (1 of 2) Never done   Pneumococcal Vaccine: 50+ Years (1 of 1 - PCV) Never done   Influenza Vaccine  Never done   COVID-19 Vaccine (4 - 2025-26 season) 10/30/2023   DTaP/Tdap/Td (1 - Tdap) 03/08/2024 (Originally 11/09/1974)   Medicare Annual Wellness (AWV)  12/27/2024   Colonoscopy  02/08/2029   Hepatitis C Screening  Completed   Meningococcal B Vaccine  Aged Out    Health Maintenance Items Addressed: DECLINES ALL SHOTS; UTD ON COLONOSCOPY  Additional Screening:  Vision Screening: Recommended annual ophthalmology exams for early detection of glaucoma and other disorders of the eye. Is the patient up to date with their annual eye exam?  Yes  Who is the provider or what is the name of the office in which the patient attends annual eye exams? [facility]  Dental Screening: Recommended annual dental exams for proper oral hygiene  Community Resource Referral / Chronic Care Management: CRR required this visit?  No   CCM required this visit?  No   Plan:    I have personally reviewed and noted the following in the patient's chart:   Medical and social history Use of alcohol, tobacco or illicit drugs  Current medications and supplements including opioid prescriptions. Patient is not currently taking opioid prescriptions. Functional ability and status Nutritional status Physical activity Advanced directives List of other physicians Hospitalizations, surgeries, and ER visits in previous 12 months Vitals Screenings to include cognitive, depression, and falls Referrals and appointments  In addition, I have reviewed and discussed with patient certain preventive protocols, quality metrics, and best practice recommendations. A written personalized  care plan for preventive services as well as general preventive health recommendations were provided to patient.   Jhonnie GORMAN Das, LPN   [id number]   After Visit Summary: (MyChart) Due to this being a telephonic visit, the after visit summary with patients personalized plan was offered to patient via MyChart   Notes: Nothing significant to report at this time.

## 2024-01-10 ENCOUNTER — Ambulatory Visit (INDEPENDENT_AMBULATORY_CARE_PROVIDER_SITE_OTHER): Admitting: Family Medicine

## 2024-01-10 DIAGNOSIS — Z91199 Patient's noncompliance with other medical treatment and regimen due to unspecified reason: Secondary | ICD-10-CM

## 2024-01-15 NOTE — Progress Notes (Signed)
 No show

## 2024-01-22 ENCOUNTER — Ambulatory Visit: Admitting: Family Medicine

## 2024-01-22 ENCOUNTER — Encounter: Payer: Self-pay | Admitting: Family Medicine

## 2024-01-22 VITALS — BP 128/82 | HR 78 | Resp 16 | Ht 76.0 in | Wt 213.6 lb

## 2024-01-22 DIAGNOSIS — F09 Unspecified mental disorder due to known physiological condition: Secondary | ICD-10-CM | POA: Diagnosis not present

## 2024-01-22 DIAGNOSIS — I10 Essential (primary) hypertension: Secondary | ICD-10-CM

## 2024-01-22 DIAGNOSIS — E519 Thiamine deficiency, unspecified: Secondary | ICD-10-CM | POA: Diagnosis not present

## 2024-01-22 DIAGNOSIS — Z86711 Personal history of pulmonary embolism: Secondary | ICD-10-CM | POA: Diagnosis not present

## 2024-01-22 DIAGNOSIS — F1021 Alcohol dependence, in remission: Secondary | ICD-10-CM

## 2024-01-22 DIAGNOSIS — N529 Male erectile dysfunction, unspecified: Secondary | ICD-10-CM

## 2024-01-22 DIAGNOSIS — E538 Deficiency of other specified B group vitamins: Secondary | ICD-10-CM | POA: Diagnosis not present

## 2024-01-22 DIAGNOSIS — Z8673 Personal history of transient ischemic attack (TIA), and cerebral infarction without residual deficits: Secondary | ICD-10-CM | POA: Diagnosis not present

## 2024-01-22 MED ORDER — APIXABAN 5 MG PO TABS: 5.0000 mg | ORAL_TABLET | Freq: Two times a day (BID) | ORAL | 1 refills | Status: AC

## 2024-01-22 MED ORDER — AMLODIPINE BESYLATE 2.5 MG PO TABS: 2.5000 mg | ORAL_TABLET | Freq: Every day | ORAL | 1 refills | Status: AC

## 2024-01-22 MED ORDER — LOSARTAN POTASSIUM 100 MG PO TABS: 100.0000 mg | ORAL_TABLET | Freq: Every day | ORAL | 1 refills | Status: AC

## 2024-01-22 NOTE — Progress Notes (Signed)
 Name: Kristopher Davis   MRN: [id number]    DOB: 1955-04-04   Date:01/22/2024       Progress Note  Subjective  Chief Complaint  Chief Complaint  Patient presents with   Medical Management of Chronic Issues   Discussed the use of AI scribe software for clinical note transcription with the patient, who gave verbal consent to proceed.  History of Present Illness Kristopher Davis is a 68 year old male with hypertension and a history of stroke who presents for medication management and follow-up.  He takes losartan  100 mg and amlodipine  12.5 mg every morning around 4:30 AM, and Eliquis  twice daily, with the evening dose at 6 PM. His blood pressure is well-controlled at 128/82 mmHg.  He experienced a stroke in March 2025 due to a left middle cerebral artery embolism. He attributes his improved health to lifestyle changes, including quitting smoking in March 2025 and reducing alcohol consumption. Previously, he drank heavily daily but now limits himself to a six-pack of beer over the weekend.  He has a history of deep vein thrombosis and pulmonary embolism and wears compression stockings, especially during long drives. He reports no current issues with his legs.  He has a history of low vitamin B12 and B1 levels, which he attributes to past alcohol use. He has not been taking these supplements recently.  He notes a significant lifestyle change, mentioning that he used to eat only once a day but now feels hungry more often, possibly due to the cessation of alcohol consumption. He reports a stable weight of 213 pounds despite increased food intake.    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 09/16/2022   History of pulmonary embolism 09/16/2022   B12 deficiency 07/13/2022   Adenomatous polyp of colon 02/08/2022   Cognitive dysfunction 08/19/2021   Vitamin B1 deficiency 08/19/2021   Folate deficiency 08/19/2021   Radiculitis of leg 08/19/2021   Erectile dysfunction 12/04/2019   Hyperlipidemia 12/03/2019   Aortic atherosclerosis 08/24/2017   Lipoma 10/28/2009   Disease of hair and hair follicles 07/28/2009   Benign essential HTN 01/07/2009   Decreased libido 02/01/2007   CD (contact dermatitis) 10/10/2006    Past Surgical History:  Procedure Laterality Date   COLONOSCOPY WITH PROPOFOL  N/A 02/08/2022   Procedure: COLONOSCOPY WITH PROPOFOL ;  Surgeon: Therisa Bi, MD;  Location: [facility] ENDOSCOPY;  Service: Gastroenterology;  Laterality: N/A;   IR CT HEAD LTD  05/16/2023   IR PERCUTANEOUS ART THROMBECTOMY/INFUSION INTRACRANIAL INC DIAG ANGIO  05/16/2023   LUMBAR LAMINECTOMY     3 surgeries   MECHANICAL THROMBECTOMY WITH AORTOGRAM AND INTERVENTION  08/2022   [facility]   RADIOLOGY WITH ANESTHESIA N/A 05/16/2023   Procedure: RADIOLOGY WITH ANESTHESIA;  Surgeon: Radiologist, Medication, MD;  Location: [facility] OR;  Service: Radiology;  Laterality: N/A;    Family History  Problem Relation Age of Onset   Cancer Mother    Hypertension Sister    Anemia Daughter     Social History   Tobacco Use   Smoking status: Former    Current packs/day: 0.00  Average packs/day: 0.2 packs/day for 37.5 years (9.4 ttl pk-yrs)    Types: Cigarettes    Start date: 11/07/1985    Quit date: 05/17/2023    Years since quitting: 0.6   Smokeless tobacco: Former    Quit date: 08/21/2017  Substance Use Topics   Alcohol use: Yes    Alcohol/week: 2.0 standard drinks of alcohol    Types: 2 Cans of beer per week     Current Outpatient Medications:    amLODipine  (NORVASC ) 2.5 MG  tablet, Take 1 tablet (2.5 mg total) by mouth daily., Disp: 90 tablet, Rfl: 1   apixaban  (ELIQUIS ) 5 MG TABS tablet, Take 1 tablet (5 mg total) by mouth 2 (two) times daily., Disp: 180 tablet, Rfl: 1   losartan  (COZAAR ) 100 MG tablet, Take 1 tablet (100 mg total) by mouth daily., Disp: 90 tablet, Rfl: 1   tadalafil  (CIALIS ) 20 MG tablet, Take 1 tablet (20 mg total) by mouth every other day as needed for erectile dysfunction. Take 1 tab 1 hour prior to intercourse, Disp: 6 tablet, Rfl: 2  Allergies  Allergen Reactions   Levofloxacin  In D5w     hallucinations    I personally reviewed active problem list, medication list, allergies, family history with the patient/caregiver today.   ROS  Ten systems reviewed and is negative except as mentioned in HPI    Objective Physical Exam VITALS: BP- 128/82 MEASUREMENTS: Height- 6'4, Weight- 213. CONSTITUTIONAL: Patient appears well-developed and well-nourished.  No distress. HEENT: Head atraumatic, normocephalic, neck supple. CARDIOVASCULAR: Normal rate, regular rhythm and normal heart sounds.  No murmur heard. No BLE edema. PULMONARY: Effort normal and breath sounds normal. No respiratory distress. ABDOMINAL: There is no tenderness or distention. MUSCULOSKELETAL: Normal gait. Without gross motor or sensory deficit. PSYCHIATRIC: Patient has a normal mood and affect. behavior is normal. Judgment and thought content normal.  Vitals:   01/22/24 1101  BP: 128/82  Pulse: 78  Resp: 16  SpO2: 94%  Weight: 213 lb 9.6 oz (96.9 kg)  Height: 6' 4 (1.93 m)    Body mass index is 26 kg/m.    PHQ2/9:    01/22/2024   10:56 AM 12/28/2023   12:46 PM 07/19/2023    3:17 PM 03/09/2023    1:08 PM 02/07/2023   12:49 PM  Depression screen PHQ 2/9  Decreased Interest 0 0 0 0 0  Down, Depressed, Hopeless 0 0 0 0 0  PHQ - 2 Score 0 0 0 0 0  Altered sleeping 0 0  0 0  Tired, decreased energy 0 0  0 0  Change in appetite 0 0  0 0  Feeling bad or  failure about yourself  0 0  0 0  Trouble concentrating 0 0  0 0  Moving slowly or fidgety/restless 0 0  0 0  Suicidal thoughts 0 0  0 0  PHQ-9 Score 0 0   0  0   Difficult doing work/chores Not difficult at all Not difficult at all  Not difficult at all      Data saved with a previous flowsheet row definition    phq 9 is negative  Fall Risk:    01/22/2024   10:56 AM 12/28/2023   12:50 PM 07/19/2023    3:17 PM 03/09/2023    1:08 PM 02/07/2023   12:49 PM  Fall Risk   Falls in the past year? 0 0 0 0 0  Number falls in past yr: 0 0 0 0 0  Injury with Fall? 0 0 0 0 0  Risk for fall due to : No Fall Risks No Fall Risks No Fall Risks No Fall Risks No Fall Risks  Follow up Falls evaluation completed Falls evaluation completed;Falls prevention discussed Falls prevention discussed;Education provided;Falls evaluation completed Falls prevention discussed;Education provided;Falls evaluation completed Falls prevention discussed      Assessment & Plan Essential hypertension Blood pressure controlled at 128/82 mmHg with losartan  and amlodipine . - Continue losartan  100 mg daily. - Continue amlodipine  12.5 mg daily. - Sent prescriptions to [facility] in [place].  History of ischemic stroke without residual deficits Ischemic stroke in March 2025 due to left middle cerebral artery embolism. No residual deficits. Compliant with Eliquis , no side effects. Declined cholesterol medication. - Continue Eliquis  as prescribed. - Recommended cholesterol medication (atorvastatin or rosuvastatin) but patient refused  History of lower extremity venous thrombosis and pulmonary embolism No current issues. Uses compression stockings appropriately. - Continue use of compression stockings as needed.  History of Alcoholism  Reduced alcohol intake to two beers per day on weekends. Reports improved breathing since quitting smoking and reducing alcohol. - Encouraged to quit drinking alcohol.  Vitamin B1 and B12  deficiency, resolved Deficiency resolved. Advised to resume supplements due to alcohol use history. - Resume vitamin B1 and B12 supplementation.  General Health Maintenance Declined pneumonia and tetanus vaccinations. Discussed importance for health maintenance. - Recommended pneumonia and tetanus vaccinations.

## 2024-02-27 ENCOUNTER — Encounter: Payer: Self-pay | Admitting: Family Medicine

## 2024-02-27 ENCOUNTER — Ambulatory Visit (INDEPENDENT_AMBULATORY_CARE_PROVIDER_SITE_OTHER): Admitting: Family Medicine

## 2024-02-27 VITALS — BP 140/84 | HR 78 | Resp 16 | Ht 76.0 in | Wt 219.7 lb

## 2024-02-27 DIAGNOSIS — Z86711 Personal history of pulmonary embolism: Secondary | ICD-10-CM | POA: Diagnosis not present

## 2024-02-27 DIAGNOSIS — E519 Thiamine deficiency, unspecified: Secondary | ICD-10-CM

## 2024-02-27 DIAGNOSIS — F1021 Alcohol dependence, in remission: Secondary | ICD-10-CM

## 2024-02-27 DIAGNOSIS — I10 Essential (primary) hypertension: Secondary | ICD-10-CM | POA: Diagnosis not present

## 2024-02-27 DIAGNOSIS — Z8673 Personal history of transient ischemic attack (TIA), and cerebral infarction without residual deficits: Secondary | ICD-10-CM | POA: Diagnosis not present

## 2024-02-27 MED ORDER — ROSUVASTATIN CALCIUM 10 MG PO TABS: 10.0000 mg | ORAL_TABLET | Freq: Every day | ORAL | 1 refills | Status: AC

## 2024-02-27 NOTE — Progress Notes (Signed)
 Name: Kristopher Davis   MRN: [id number]    DOB: 1955-07-21   Date:02/27/2024       Progress Note  Subjective  Chief Complaint  Chief Complaint  Patient presents with   Medical Management of Chronic Issues   Discussed the use of AI scribe software for clinical note transcription with the patient, who gave verbal consent to proceed.  History of Present Illness Kristopher Davis is a 68 year old male with hypertension who presents for routine follow-up.  He has been doing well since his last visit with no new problems. He experienced mild nasal congestion four days ago after being outside in shorts but has no sore throat, cough, or other symptoms. His blood pressure today was 144/90 mmHg, which is higher than his previous reading of 128/82 mmHg. He takes amlodipine  2.5 mg daily and losartan  100 mg daily, usually around 4:30 AM.  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 He continues to consume alcohol but not heavily, planning to have a six-pack over [employer] from Thursday to Saturday. He previously had low B12 and B1 levels due to alcohol use but currently feels healthy. He is finally agreeing today to start taking statin therapy  He is on Eliquis  5 mg twice daily for his history of pulmonary embolism and has no easy bruising or bleeding. He takes 'okra water ' with his pills every morning, as suggested by his wife, to help with cramping. His evening dose of medication is taken around 6 PM.  He has a history of chronic macrovascular ischemic disease and patent foramen ovale. He has not experienced any chest pain, palpitations, or shortness of breath recently.    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 09/16/2022   History of pulmonary embolus (PE) 09/16/2022   B12 deficiency 07/13/2022   Adenomatous polyp of colon 02/08/2022   Cognitive dysfunction 08/19/2021   Vitamin B1 deficiency 08/19/2021   Folate deficiency 08/19/2021   Erectile dysfunction 12/04/2019   Hyperlipidemia 12/03/2019   Aortic atherosclerosis 08/24/2017   Lipoma 10/28/2009   Disease of hair and hair follicles 07/28/2009   Benign essential HTN 01/07/2009   Decreased libido 02/01/2007   CD (contact dermatitis) 10/10/2006    Past Surgical History:  Procedure Laterality Date   COLONOSCOPY WITH PROPOFOL  N/A 02/08/2022   Procedure: COLONOSCOPY WITH PROPOFOL ;  Surgeon: Therisa Bi, MD;  Location: [facility];  Service: Gastroenterology;  Laterality: N/A;   IR CT HEAD LTD  05/16/2023   IR PERCUTANEOUS ART THROMBECTOMY/INFUSION INTRACRANIAL INC DIAG ANGIO  05/16/2023   LUMBAR LAMINECTOMY     3 surgeries   MECHANICAL THROMBECTOMY WITH AORTOGRAM AND INTERVENTION  08/2022   [facility]   RADIOLOGY WITH ANESTHESIA N/A 05/16/2023   Procedure: RADIOLOGY WITH ANESTHESIA;  Surgeon: Radiologist, Medication, MD;  Location: [facility] OR;  Service: Radiology;  Laterality: N/A;    Family History  Problem Relation Age of Onset   Cancer Mother    Hypertension Sister    Anemia Daughter     Social History   Tobacco Use   Smoking status: Former  Current packs/day: 0.00    Average packs/day: 0.3 packs/day for 37.5 years (9.4 ttl pk-yrs)    Types: Cigarettes    Start date: 11/07/1985    Quit date: 05/17/2023    Years since quitting: 0.7   Smokeless tobacco: Former    Quit date: 08/21/2017  Substance Use Topics   Alcohol use: Yes    Alcohol/week: 2.0 standard drinks of alcohol    Types: 2 Cans of beer per week    Current Medications[1]  Allergies[2]  I personally reviewed active problem  list, medication list, allergies, family history with the patient/caregiver today.   ROS  Ten systems reviewed and is negative except as mentioned in HPI    Objective Physical Exam  CONSTITUTIONAL: Patient appears well-developed and well-nourished.  No distress. HEENT: Head atraumatic, normocephalic, neck supple. CARDIOVASCULAR: Normal rate, regular rhythm and normal heart sounds.  No murmur heard. No BLE edema. PULMONARY: Effort normal and breath sounds normal. No respiratory distress. ABDOMINAL: There is no tenderness or distention. MUSCULOSKELETAL: Normal gait. Without gross motor or sensory deficit. PSYCHIATRIC: Patient has a normal mood and affect. behavior is normal. Judgment and thought content normal.  Vitals:   02/27/24 0845 02/27/24 0923  BP: (!) 144/90 (!) 140/84  Pulse: 78   Resp: 16   SpO2: 97%   Weight: 224 lb 1.6 oz (101.7 kg) 219 lb 11.2 oz (99.7 kg)  Height: 6' 4 (1.93 m)     Body mass index is 26.74 kg/m.   PHQ2/9:    02/27/2024    8:44 AM 01/22/2024   10:56 AM 12/28/2023   12:46 PM 07/19/2023    3:17 PM 03/09/2023    1:08 PM  Depression screen PHQ 2/9  Decreased Interest 0 0 0 0 0  Down, Depressed, Hopeless 0 0 0 0 0  PHQ - 2 Score 0 0 0 0 0  Altered sleeping 0 0 0  0  Tired, decreased energy 0 0 0  0  Change in appetite 0 0 0  0  Feeling bad or failure about yourself  0 0 0  0  Trouble concentrating 0 0 0  0  Moving slowly or fidgety/restless 0 0 0  0  Suicidal thoughts 0 0 0  0  PHQ-9 Score 0 0 0   0   Difficult doing work/chores Not difficult at all Not difficult at all Not difficult at all  Not difficult at all     Data saved with a previous flowsheet row definition    phq 9 is negative  Fall Risk:    02/27/2024    8:44 AM 01/22/2024   10:56 AM 12/28/2023   12:50 PM 07/19/2023    3:17 PM 03/09/2023    1:08 PM  Fall Risk   Falls in the past year? 0 0 0 0 0  Number falls in past yr: 0 0 0 0 0  Injury with Fall? 0 0  0  0  0   Risk  for fall due to : No Fall Risks No Fall Risks No Fall Risks No Fall Risks No Fall Risks  Follow up Falls evaluation completed Falls evaluation completed Falls evaluation completed;Falls prevention discussed Falls prevention discussed;Education provided;Falls evaluation completed Falls prevention discussed;Education provided;Falls evaluation completed     Data saved with a previous flowsheet row definition     Assessment & Plan Essential hypertension Blood pressure elevated at 144/90 mmHg, previously controlled at 128/82 mmHg. Currently on amlodipine  2.5 mg daily and losartan  100 mg daily. -  Rechecked blood pressure before leaving the clinic. - Continue current antihypertensive regimen.  History of cerebrovascular accident with chronic macrovascular ischemic disease No recurrent strokes or symptoms. Stroke was left middle cerebral artery embolism in March. Not on statin therapy despite recommendation. Discussed importance of statin therapy to prevent future strokes and heart attacks. Explained that statins helps reduce risk of heart attacks and strokes  - Initiated rosuvastatin therapy. 10mg  daily  - Ordered blood work to assess cholesterol levels before next visit.  History of pulmonary embolism No current symptoms of shortness of breath or chest pain. On Eliquis  5 mg twice daily for anticoagulation. - Continue Eliquis  5 mg twice daily.  Hyperlipidemia Not on statin therapy despite recommendation. Discussed importance of statin therapy to prevent future strokes and heart attacks. Explained that statins help remove plaque from vessels, reducing risk of stroke and heart attack. Offered injectable statins as an alternative if oral statins are not tolerated. - Initiated rosuvastatin therapy. - Ordered blood work to assess cholesterol levels before next visit.  Alcohol dependence, in remission No current alcohol abuse. Consumes alcohol moderately, especially around holidays. No binge drinking  reported.  Thiamine  deficiency Previously low B1 and B12 levels due to alcohol use. Currently feels healthy and is not taking supplements. Discussed importance of monitoring B1 and B12 levels. - Ordered blood work to assess B1 and B12 levels.        [1]  Current Outpatient Medications:    amLODipine  (NORVASC ) 2.5 MG tablet, Take 1 tablet (2.5 mg total) by mouth daily., Disp: 90 tablet, Rfl: 1   apixaban  (ELIQUIS ) 5 MG TABS tablet, Take 1 tablet (5 mg total) by mouth 2 (two) times daily., Disp: 180 tablet, Rfl: 1   losartan  (COZAAR ) 100 MG tablet, Take 1 tablet (100 mg total) by mouth daily., Disp: 90 tablet, Rfl: 1   rosuvastatin (CRESTOR) 10 MG tablet, Take 1 tablet (10 mg total) by mouth daily., Disp: 90 tablet, Rfl: 1   tadalafil  (CIALIS ) 20 MG tablet, Take 1 tablet (20 mg total) by mouth every other day as needed for erectile dysfunction. Take 1 tab 1 hour prior to intercourse, Disp: 6 tablet, Rfl: 2 [2]  Allergies Allergen Reactions   Levofloxacin  In D5w     hallucinations

## 2024-03-09 LAB — VITAMIN B1: Vitamin B1 (Thiamine): 7 nmol/L — ABNORMAL LOW (ref 8–30)

## 2024-03-09 LAB — CBC WITH DIFFERENTIAL/PLATELET
Absolute Lymphocytes: 1575 {cells}/uL (ref 850–3900)
Absolute Monocytes: 451 {cells}/uL (ref 200–950)
Basophils Absolute: 38 {cells}/uL (ref 0–200)
Basophils Relative: 0.8 %
Eosinophils Absolute: 71 {cells}/uL (ref 15–500)
Eosinophils Relative: 1.5 %
HCT: 41.5 % (ref 39.4–51.1)
Hemoglobin: 14 g/dL (ref 13.2–17.1)
MCH: 31.8 pg (ref 27.0–33.0)
MCHC: 33.7 g/dL (ref 31.6–35.4)
MCV: 94.3 fL (ref 81.4–101.7)
MPV: 10.1 fL (ref 7.5–12.5)
Monocytes Relative: 9.6 %
Neutro Abs: 2566 {cells}/uL (ref 1500–7800)
Neutrophils Relative %: 54.6 %
Platelets: 249 Thousand/uL (ref 140–400)
RBC: 4.4 Million/uL (ref 4.20–5.80)
RDW: 12.8 % (ref 11.0–15.0)
Total Lymphocyte: 33.5 %
WBC: 4.7 Thousand/uL (ref 3.8–10.8)

## 2024-03-09 LAB — COMPREHENSIVE METABOLIC PANEL WITH GFR
AG Ratio: 1.6 (calc) (ref 1.0–2.5)
ALT: 11 U/L (ref 9–46)
AST: 17 U/L (ref 10–35)
Albumin: 4.2 g/dL (ref 3.6–5.1)
Alkaline phosphatase (APISO): 48 U/L (ref 35–144)
BUN: 12 mg/dL (ref 7–25)
CO2: 26 mmol/L (ref 20–32)
Calcium: 9.2 mg/dL (ref 8.6–10.3)
Chloride: 109 mmol/L (ref 98–110)
Creat: 1.24 mg/dL (ref 0.70–1.35)
Globulin: 2.6 g/dL (ref 1.9–3.7)
Glucose, Bld: 90 mg/dL (ref 65–99)
Potassium: 5 mmol/L (ref 3.5–5.3)
Sodium: 143 mmol/L (ref 135–146)
Total Bilirubin: 0.8 mg/dL (ref 0.2–1.2)
Total Protein: 6.8 g/dL (ref 6.1–8.1)
eGFR: 63 mL/min/1.73m2

## 2024-03-09 LAB — LIPID PANEL
Cholesterol: 167 mg/dL
HDL: 83 mg/dL
LDL Cholesterol (Calc): 73 mg/dL
Non-HDL Cholesterol (Calc): 84 mg/dL
Total CHOL/HDL Ratio: 2 (calc)
Triglycerides: 37 mg/dL

## 2024-03-09 LAB — B12 AND FOLATE PANEL
Folate: 10 ng/mL
Vitamin B-12: 187 pg/mL — ABNORMAL LOW (ref 200–1100)

## 2024-03-11 ENCOUNTER — Ambulatory Visit: Payer: Self-pay | Admitting: Family Medicine

## 2024-03-12 ENCOUNTER — Ambulatory Visit

## 2024-03-28 NOTE — Progress Notes (Signed)
 Kristopher Davis                                          MRN: [id number]   03/28/2024   The [facility] Quality Team Specialist reviewed this patient medical record for the purposes of chart review for care gap closure. The following were reviewed: chart review for care gap closure-controlling blood pressure.    [facility] Quality Team

## 2024-04-02 ENCOUNTER — Ambulatory Visit

## 2024-04-02 DIAGNOSIS — E538 Deficiency of other specified B group vitamins: Secondary | ICD-10-CM | POA: Diagnosis not present

## 2024-04-02 MED ORDER — CYANOCOBALAMIN 1000 MCG/ML IJ SOLN
1000.0000 ug | Freq: Once | INTRAMUSCULAR | Status: AC
  Administered 2024-04-02: 1000 ug via INTRAMUSCULAR

## 2024-04-23 ENCOUNTER — Ambulatory Visit: Admitting: Family Medicine

## 2024-05-28 ENCOUNTER — Ambulatory Visit: Admitting: Family Medicine

## 2025-01-02 ENCOUNTER — Ambulatory Visit
